# Patient Record
Sex: Male | Born: 1960
Health system: Southern US, Community
[De-identification: ages and names within clinical notes are randomized; demographics above are authoritative.]

## PROBLEM LIST (undated history)

## (undated) DIAGNOSIS — C9590 Leukemia, unspecified not having achieved remission: Secondary | ICD-10-CM

## (undated) DIAGNOSIS — C801 Malignant (primary) neoplasm, unspecified: Secondary | ICD-10-CM

## (undated) DIAGNOSIS — Q059 Spina bifida, unspecified: Secondary | ICD-10-CM

## (undated) HISTORY — DX: Malignant (primary) neoplasm, unspecified: C80.1

## (undated) HISTORY — PX: FRACTURE SURGERY: SHX138

## (undated) HISTORY — PX: INGUINAL HERNIA REPAIR: SUR1180

## (undated) HISTORY — DX: Spina bifida, unspecified: Q05.9

## (undated) HISTORY — DX: Leukemia, unspecified not having achieved remission: C95.90

---

## 2005-02-02 ENCOUNTER — Emergency Department (HOSPITAL_COMMUNITY): Admission: EM | Admit: 2005-02-02 | Discharge: 2005-02-02 | Payer: Self-pay | Admitting: Emergency Medicine

## 2013-04-18 ENCOUNTER — Telehealth: Payer: Self-pay | Admitting: Family Medicine

## 2013-05-12 ENCOUNTER — Ambulatory Visit (INDEPENDENT_AMBULATORY_CARE_PROVIDER_SITE_OTHER): Payer: Medicare Other | Admitting: Family Medicine

## 2013-05-12 ENCOUNTER — Encounter: Payer: Self-pay | Admitting: Family Medicine

## 2013-05-12 VITALS — BP 134/75 | HR 56 | Temp 97.1°F | Ht 70.0 in | Wt 170.0 lb

## 2013-05-12 DIAGNOSIS — Q539 Undescended testicle, unspecified: Secondary | ICD-10-CM

## 2013-05-12 DIAGNOSIS — M129 Arthropathy, unspecified: Secondary | ICD-10-CM

## 2013-05-12 DIAGNOSIS — Q5310 Unspecified undescended testicle, unilateral: Secondary | ICD-10-CM

## 2013-05-12 DIAGNOSIS — R6250 Unspecified lack of expected normal physiological development in childhood: Secondary | ICD-10-CM

## 2013-05-12 DIAGNOSIS — L989 Disorder of the skin and subcutaneous tissue, unspecified: Secondary | ICD-10-CM

## 2013-05-12 DIAGNOSIS — M1990 Unspecified osteoarthritis, unspecified site: Secondary | ICD-10-CM

## 2013-05-12 DIAGNOSIS — Q059 Spina bifida, unspecified: Secondary | ICD-10-CM

## 2013-05-12 DIAGNOSIS — Z0000 Encounter for general adult medical examination without abnormal findings: Secondary | ICD-10-CM

## 2013-05-12 DIAGNOSIS — L723 Sebaceous cyst: Secondary | ICD-10-CM

## 2013-05-12 LAB — POCT CBC
Granulocyte percent: 40.8 %G (ref 37–80)
HCT, POC: 46.5 % (ref 43.5–53.7)
Hemoglobin: 16.4 g/dL (ref 14.1–18.1)
Lymph, poc: 7.4 — AB (ref 0.6–3.4)
MCH, POC: 29.9 pg (ref 27–31.2)
MCHC: 35.2 g/dL (ref 31.8–35.4)
MCV: 84.9 fL (ref 80–97)
MPV: 9.4 fL (ref 0–99.8)
POC Granulocyte: 5.5 (ref 2–6.9)
POC LYMPH PERCENT: 55.4 %L — AB (ref 10–50)
Platelet Count, POC: 134 10*3/uL — AB (ref 142–424)
RBC: 5.5 M/uL (ref 4.69–6.13)
RDW, POC: 13.7 %
WBC: 13.4 10*3/uL — AB (ref 4.6–10.2)

## 2013-05-12 LAB — COMPLETE METABOLIC PANEL WITH GFR
ALT: 14 U/L (ref 0–53)
AST: 17 U/L (ref 0–37)
Albumin: 4.5 g/dL (ref 3.5–5.2)
Alkaline Phosphatase: 42 U/L (ref 39–117)
BUN: 20 mg/dL (ref 6–23)
CO2: 24 mEq/L (ref 19–32)
Calcium: 9.6 mg/dL (ref 8.4–10.5)
Chloride: 104 mEq/L (ref 96–112)
Creat: 0.9 mg/dL (ref 0.50–1.35)
GFR, Est African American: >89 mL/min
GFR, Est Non African American: >89 mL/min
Glucose, Bld: 100 mg/dL — ABNORMAL HIGH (ref 70–99)
Potassium: 4.8 mEq/L (ref 3.5–5.3)
Sodium: 140 mEq/L (ref 135–145)
Total Bilirubin: 0.8 mg/dL (ref 0.3–1.2)
Total Protein: 7.2 g/dL (ref 6.0–8.3)

## 2013-05-12 LAB — TSH: TSH: 4.44 u[IU]/mL (ref 0.350–4.500)

## 2013-05-12 LAB — LIPID PANEL
Cholesterol: 165 mg/dL (ref 0–200)
HDL: 46 mg/dL (ref >39)
LDL Cholesterol: 108 mg/dL — ABNORMAL HIGH (ref 0–99)
Total CHOL/HDL Ratio: 3.6 Ratio
Triglycerides: 54 mg/dL (ref <150)
VLDL: 11 mg/dL (ref 0–40)

## 2013-05-12 LAB — PSA: PSA: 2.45 ng/mL (ref <=4.00)

## 2013-05-12 NOTE — Patient Instructions (Signed)

## 2013-05-12 NOTE — Progress Notes (Signed)
  Subjective:    Patient ID: Lance Erickson, male    DOB: 22-Jun-1961, 52 y.o.   MRN: [id number]  HPI This 52 y.o. male presents for evaluation of establishment.  He has hx of closed spina bifida.  He is accompanied by his mother who gives hx.  He has hx of developmental delay.  He has some knots behind his right ear and umbilicus.  His mother states he has some moles on his back.  He gets occasional back pain if he over does it working in the yard and his back hurts and he takes naproxen and this helps.  He has not seen a PCP in over 20 years.   Hospitalizaiton - Pneumonia in childhood x2  PSH- ORIF left fx arm.  Undescended testes and surgery in childhood.   Family hx - mother hypertension, father - died 40 MI, kidney stones, back pain.  Brother - CAD, CABG, DM, OA, Cirrhosis, PVD  Immunizations - Flu shot last year, TD 2006.   Review of Systems    No chest pain, SOB, HA, dizziness, vision change, N/V, diarrhea, constipation, dysuria, urinary urgency or frequency, myalgias, arthralgias or rash.  Objective:   Physical Exam  Vital signs noted  Well developed well nourished male.  HEENT - Head atraumatic Large sebaceous cyst behind right ear.                Eyes - PERRLA, Conjuctiva - clear Sclera- Clear EOMI                Ears - EAC's Wnl TM's Wnl Gross Hearing WNL                Nose - Nares patent                 Throat - oropharanx wnl  Respiratory - Lungs CTA bilateral Cardiac - RRR S1 and S2 without murmur GI - Abdomen soft Nontender and bowel sounds active x 4.  Small umbilical hernia. Extremities - No edema. Neuro - Grossly intact. Skin - irregular Nevi left upper back.     Assessment & Plan:  Routine general medical examination at a health care facility - Plan: POCT CBC, Lipid panel, TSH, COMPLETE METABOLIC PANEL WITH GFR, PSA.  He is talked to about getting colonoscopy and will discuss at next visit.  Spina bifida - Closed and stable.  Undescended right testes -  Plan: Lipid panel  Sebaceous cyst - Plan: Ambulatory referral to Dermatology  Skin lesion of back - Plan: Ambulatory referral to Dermatology  Developmental delay Stable

## 2013-05-16 ENCOUNTER — Other Ambulatory Visit: Payer: Self-pay | Admitting: Family Medicine

## 2013-05-16 MED ORDER — AMOXICILLIN 875 MG PO TABS: 875.0000 mg | ORAL_TABLET | Freq: Two times a day (BID) | ORAL | Status: DC

## 2013-12-13 ENCOUNTER — Other Ambulatory Visit: Payer: Self-pay | Admitting: Family Medicine

## 2013-12-13 DIAGNOSIS — L723 Sebaceous cyst: Secondary | ICD-10-CM

## 2014-12-07 DIAGNOSIS — Z08 Encounter for follow-up examination after completed treatment for malignant neoplasm: Secondary | ICD-10-CM | POA: Diagnosis not present

## 2014-12-07 DIAGNOSIS — Z85828 Personal history of other malignant neoplasm of skin: Secondary | ICD-10-CM | POA: Diagnosis not present

## 2015-05-02 ENCOUNTER — Ambulatory Visit (INDEPENDENT_AMBULATORY_CARE_PROVIDER_SITE_OTHER): Payer: Medicare Other | Admitting: Family Medicine

## 2015-05-02 ENCOUNTER — Encounter: Payer: Self-pay | Admitting: Family Medicine

## 2015-05-02 ENCOUNTER — Encounter (INDEPENDENT_AMBULATORY_CARE_PROVIDER_SITE_OTHER): Payer: Self-pay

## 2015-05-02 VITALS — BP 122/76 | HR 67 | Temp 97.6°F | Ht 70.0 in | Wt 166.0 lb

## 2015-05-02 DIAGNOSIS — Z0000 Encounter for general adult medical examination without abnormal findings: Secondary | ICD-10-CM | POA: Insufficient documentation

## 2015-05-02 DIAGNOSIS — Q059 Spina bifida, unspecified: Secondary | ICD-10-CM | POA: Insufficient documentation

## 2015-05-02 DIAGNOSIS — R739 Hyperglycemia, unspecified: Secondary | ICD-10-CM

## 2015-05-02 LAB — GLUCOSE, POCT (MANUAL RESULT ENTRY): POC Glucose: 95 mg/dl (ref 70–99)

## 2015-05-02 NOTE — Progress Notes (Signed)
   Subjective:    Patient ID: Lance Erickson, male    DOB: 1960/11/28, 54 y.o.   MRN: [id number]  HPI 54 year old gentleman here for physical. He has a history of closed spina bifida. He lives with his mother and tends his roses and his dogs. Generally, he is very healthy. There is a question of some mental delays. Reviewing his labs from last exam he has borderline sugar, perhaps prediabetes. PSA, though not high was a little higher than anticipated. There are No new symptoms or complaints today  There are no active problems to display for this patient.  Outpatient Encounter Prescriptions as of 05/02/2015  Medication Sig  . [DISCONTINUED] amoxicillin (AMOXIL) 875 MG tablet Take 1 tablet (875 mg total) by mouth 2 (two) times daily.  . [DISCONTINUED] naproxen sodium (ANAPROX) 220 MG tablet Take 220 mg by mouth 2 (two) times daily with a meal.   No facility-administered encounter medications on file as of 05/02/2015.      Review of Systems  Constitutional: Negative.   HENT: Negative.   Eyes: Negative.   Respiratory: Negative.  Negative for shortness of breath.   Cardiovascular: Negative.  Negative for chest pain and leg swelling.  Gastrointestinal: Negative.   Genitourinary: Negative.   Musculoskeletal: Negative.   Skin: Negative.   Neurological: Negative.   Psychiatric/Behavioral: Negative.   All other systems reviewed and are negative.      Objective:   Physical Exam  Constitutional: He is oriented to person, place, and time. He appears well-developed and well-nourished.  Cardiovascular: Normal rate, regular rhythm and normal heart sounds.   Pulmonary/Chest: Effort normal and breath sounds normal.  Abdominal: Soft. Bowel sounds are normal.  Umbilical hernia present but easily reducible  Genitourinary: Prostate normal.  External hemorrhoid present  Musculoskeletal: Normal range of motion.  Neurological: He is alert and oriented to person, place, and time. He has normal  reflexes.  Psychiatric: He has a normal mood and affect. His behavior is normal.     BP 122/76 mmHg  Pulse 67  Temp(Src) 97.6 F (36.4 C) (Oral)  Ht 5\' 10"  (1.778 m)  Wt 166 lb (75.297 kg)  BMI 23.82 kg/m2       Assessment & Plan:  1. Health care maintenance Physically, exam is normal. There is some slowness in mentation but he is able to answer questions appropriately and enjoys a lifestyle which keeps him occupied. Living with his mother, he has minimal responsibilities or worries. - PSA, total and free - POCT glucose (manual entry)  2. Spina bifida This problem is stable   Wardell Honour MD

## 2015-05-03 LAB — PSA, TOTAL AND FREE
PROSTATE SPECIFIC AG, SERUM: 2.9 ng/mL (ref 0.0–4.0)
PSA FREE: 0.5 ng/mL
PSA, Free Pct: 17.2 %

## 2015-07-09 DIAGNOSIS — D485 Neoplasm of uncertain behavior of skin: Secondary | ICD-10-CM | POA: Diagnosis not present

## 2015-07-09 DIAGNOSIS — L570 Actinic keratosis: Secondary | ICD-10-CM | POA: Diagnosis not present

## 2015-07-09 DIAGNOSIS — C44311 Basal cell carcinoma of skin of nose: Secondary | ICD-10-CM | POA: Diagnosis not present

## 2015-08-11 DIAGNOSIS — C801 Malignant (primary) neoplasm, unspecified: Secondary | ICD-10-CM

## 2015-08-11 HISTORY — DX: Malignant (primary) neoplasm, unspecified: C80.1

## 2015-08-15 DIAGNOSIS — C44311 Basal cell carcinoma of skin of nose: Secondary | ICD-10-CM | POA: Diagnosis not present

## 2015-08-23 ENCOUNTER — Ambulatory Visit (INDEPENDENT_AMBULATORY_CARE_PROVIDER_SITE_OTHER): Payer: Medicare Other

## 2015-08-23 DIAGNOSIS — Z23 Encounter for immunization: Secondary | ICD-10-CM | POA: Diagnosis not present

## 2015-10-17 ENCOUNTER — Other Ambulatory Visit: Payer: Self-pay | Admitting: *Deleted

## 2015-10-17 DIAGNOSIS — Z1211 Encounter for screening for malignant neoplasm of colon: Secondary | ICD-10-CM

## 2015-11-16 NOTE — Progress Notes (Signed)
I cannot see results on this test

## 2016-02-14 DIAGNOSIS — Z85828 Personal history of other malignant neoplasm of skin: Secondary | ICD-10-CM | POA: Diagnosis not present

## 2016-02-14 DIAGNOSIS — D225 Melanocytic nevi of trunk: Secondary | ICD-10-CM | POA: Diagnosis not present

## 2016-02-14 DIAGNOSIS — C44319 Basal cell carcinoma of skin of other parts of face: Secondary | ICD-10-CM | POA: Diagnosis not present

## 2016-06-05 DIAGNOSIS — C44319 Basal cell carcinoma of skin of other parts of face: Secondary | ICD-10-CM | POA: Diagnosis not present

## 2016-07-22 ENCOUNTER — Telehealth: Payer: Self-pay | Admitting: Family Medicine

## 2016-08-27 ENCOUNTER — Ambulatory Visit (INDEPENDENT_AMBULATORY_CARE_PROVIDER_SITE_OTHER): Payer: Medicare Other | Admitting: Family Medicine

## 2016-08-27 ENCOUNTER — Encounter: Payer: Self-pay | Admitting: Family Medicine

## 2016-08-27 VITALS — BP 113/70 | HR 61 | Temp 97.0°F | Ht 70.0 in | Wt 154.0 lb

## 2016-08-27 DIAGNOSIS — Z136 Encounter for screening for cardiovascular disorders: Secondary | ICD-10-CM | POA: Diagnosis not present

## 2016-08-27 DIAGNOSIS — Z23 Encounter for immunization: Secondary | ICD-10-CM | POA: Diagnosis not present

## 2016-08-27 DIAGNOSIS — Z125 Encounter for screening for malignant neoplasm of prostate: Secondary | ICD-10-CM | POA: Diagnosis not present

## 2016-08-27 DIAGNOSIS — Z0000 Encounter for general adult medical examination without abnormal findings: Secondary | ICD-10-CM

## 2016-08-27 NOTE — Progress Notes (Signed)
   Subjective:    Patient ID: Lance Erickson, male    DOB: August 23, 1961, 55 y.o.   MRN: [id number]  HPI 55 year old gentleman who is on disability secondary to spina bifida. He lives with his mother. He has no complaints today. He gets occasional back pain or headache and takes Aleve which is effective.  Patient Active Problem List   Diagnosis Date Noted  . Spina bifida ([place]) 05/02/2015  . Annual physical exam 05/02/2015   No outpatient encounter prescriptions on file as of 08/27/2016.   No facility-administered encounter medications on file as of 08/27/2016.       Review of Systems  Constitutional: Positive for unexpected weight change.  Respiratory: Negative.   Cardiovascular: Negative.   Gastrointestinal: Positive for blood in stool.  Musculoskeletal: Negative.   Psychiatric/Behavioral: Negative.        Objective:   Physical Exam  Constitutional: He is oriented to person, place, and time. He appears well-developed and well-nourished.  HENT:  Head: Normocephalic.  Mouth/Throat: Oropharynx is clear and moist.  Eyes: Pupils are equal, round, and reactive to light.  Neck: Normal range of motion.  Cardiovascular: Normal rate, regular rhythm and normal heart sounds.   Pulmonary/Chest: Effort normal.  Abdominal: Soft. There is no tenderness.  Genitourinary: Prostate normal.  Musculoskeletal: Normal range of motion.  Neurological: He is alert and oriented to person, place, and time.  Psychiatric: He has a normal mood and affect. His behavior is normal.   BP 113/70   Pulse 61   Temp 97 F (36.1 C) (Oral)   Ht '5\' 10"'$  (1.778 m)   Wt 154 lb (69.9 kg)   BMI 22.10 kg/m         Assessment & Plan:  1. Health care maintenance Exam is normal. He has lost 12 pounds in a little over a year. Appetite is good. He claims to eat sweets now that's why he lost weight - CMP14+EGFR - Lipid panel - PSA, total and free  2. Encounter for immunization Flu vaccine given  Wardell Honour MD - Flu Vaccine QUAD 36+ mos IM

## 2016-08-28 LAB — LIPID PANEL
CHOLESTEROL TOTAL: 157 mg/dL (ref 100–199)
Chol/HDL Ratio: 3 ratio units (ref 0.0–5.0)
HDL: 52 mg/dL (ref >39)
LDL CALC: 92 mg/dL (ref 0–99)
TRIGLYCERIDES: 65 mg/dL (ref 0–149)
VLDL CHOLESTEROL CAL: 13 mg/dL (ref 5–40)

## 2016-08-28 LAB — CMP14+EGFR
ALBUMIN: 4.2 g/dL (ref 3.5–5.5)
ALK PHOS: 48 IU/L (ref 39–117)
ALT: 13 IU/L (ref 0–44)
AST: 15 IU/L (ref 0–40)
Albumin/Globulin Ratio: 1.7 (ref 1.2–2.2)
BUN / CREAT RATIO: 20 (ref 9–20)
BUN: 18 mg/dL (ref 6–24)
Bilirubin Total: 0.5 mg/dL (ref 0.0–1.2)
CHLORIDE: 99 mmol/L (ref 96–106)
CO2: 25 mmol/L (ref 18–29)
CREATININE: 0.89 mg/dL (ref 0.76–1.27)
Calcium: 9.3 mg/dL (ref 8.7–10.2)
GFR calc Af Amer: 112 mL/min/{1.73_m2} (ref >59)
GFR calc non Af Amer: 97 mL/min/{1.73_m2} (ref >59)
GLUCOSE: 95 mg/dL (ref 65–99)
Globulin, Total: 2.5 g/dL (ref 1.5–4.5)
Potassium: 4.3 mmol/L (ref 3.5–5.2)
Sodium: 140 mmol/L (ref 134–144)
Total Protein: 6.7 g/dL (ref 6.0–8.5)

## 2016-08-28 LAB — PSA, TOTAL AND FREE
PSA FREE: 0.51 ng/mL
PSA, Free Pct: 17.6 %
Prostate Specific Ag, Serum: 2.9 ng/mL (ref 0.0–4.0)

## 2017-03-23 ENCOUNTER — Ambulatory Visit (INDEPENDENT_AMBULATORY_CARE_PROVIDER_SITE_OTHER): Payer: Medicare Other | Admitting: *Deleted

## 2017-03-23 VITALS — BP 120/71 | HR 65 | Temp 97.7°F | Ht 69.0 in | Wt 159.0 lb

## 2017-03-23 DIAGNOSIS — Z0000 Encounter for general adult medical examination without abnormal findings: Secondary | ICD-10-CM | POA: Diagnosis not present

## 2017-03-23 NOTE — Patient Instructions (Signed)
  Mr. Ringler , Thank you for taking time to come for your Medicare Wellness Visit. I appreciate your ongoing commitment to your health goals. Please review the following plan we discussed and let me know if I can assist you in the future.   These are the goals we discussed: Goals    . Have 3 meals a day          healthy       This is a list of the screening recommended for you and due dates:  Health Maintenance  Topic Date Due  . Tetanus Vaccine  04/20/2017*  . Colon Cancer Screening  04/23/2017*  .  Hepatitis C: One time screening is recommended by Center for Disease Control  (CDC) for  adults born from [birth year] through 1965.   03/23/2022*  . HIV Screening  03/23/2024*  . Flu Shot  06/10/2017  *Topic was postponed. The date shown is not the original due date.   Keep CPE appt yearly - with Dr Livia Snellen We will recommend you get a CXR, EKG and the prevnar 13 at your next visit. Review the advanced directives.

## 2017-03-23 NOTE — Progress Notes (Signed)
Subjective:   Lance Erickson is a 56 y.o. male who presents for Medicare Annual/Subsequent preventive examination. He is accompanied today by his mother, whom he lives with. He has never worked, due to early diagnosis of Spina Bifida. He enjoys watching races and fishing. For exercise he does yard work. His diet is none to healthy, per mom. He enjoys pizza, burgers and hot dogs. He attends church regularly. He has 3 brothers that live locally. He and his mom care for 6 pets. Fall hazards were discussed today and he states that his health is about the same as a year ago.        Objective:    Vitals: BP 120/71 (BP Location: Right Arm)   Pulse 65   Temp 97.7 F (36.5 C) (Oral)   Ht 5\' 9"  (1.753 m)   Wt 159 lb (72.1 kg)   BMI 23.48 kg/m   Body mass index is 23.48 kg/m.  Tobacco History  Smoking Status  . Never Smoker  Smokeless Tobacco  . Never Used     Counseling given: Not Answered never used any tobacco products.  Past Medical History:  Diagnosis Date  . Cancer ([place]) 08/2015   skin, nose  . Spinal bifida, closed    Past Surgical History:  Procedure Laterality Date  . FRACTURE SURGERY     left arm   . INGUINAL HERNIA REPAIR Right    Family History  Problem Relation Age of Onset  . Hypertension Mother   . Deep vein thrombosis Mother   . Hyperlipidemia Mother   . Cancer Mother        skin  . Heart attack Father   . Diabetes Brother   . Hypertension Brother   . Hyperlipidemia Brother   . Heart attack Brother   . Arthritis Brother        back pain  . Arthritis Brother        back pain  . Cancer Maternal Grandmother        varian and uterus cancer  . Heart attack Maternal Grandmother   . Alcohol abuse Maternal Grandfather   . Tuberculosis Maternal Grandfather   . Heart disease Paternal Grandmother   . Pneumonia Paternal Grandfather    History  Sexual Activity  . Sexual activity: Not Currently    Outpatient Encounter Prescriptions as of 03/23/2017    Medication Sig  . naproxen (NAPROSYN) 250 MG tablet Take by mouth 2 (two) times daily with a meal.   No facility-administered encounter medications on file as of 03/23/2017.     Activities of Daily Living In your present state of health, do you have any difficulty performing the following activities: 03/23/2017  Hearing? N  Vision? Y  Difficulty concentrating or making decisions? N  Walking or climbing stairs? N  Dressing or bathing? N  Doing errands, shopping? N  Some recent data might be hidden  he wears reading glasses as needed.  Patient Care Team: [name], MD as PCP - General (Family Medicine) Druscilla Brownie, MD as Consulting Physician (Dermatology)   Assessment:    Exercise Activities and Dietary recommendations    Goals    . Have 3 meals a day          healthy      Fall Risk Fall Risk  03/23/2017  Falls in the past year? No   Depression Screen PHQ 2/9 Scores 03/23/2017 08/27/2016  PHQ - 2 Score 0 0    Cognitive Function MMSE - Mini  Mental State Exam 03/23/2017  Orientation to time 5  Orientation to Place 5  Registration 2  Attention/ Calculation 5  Recall 2  Language- name 2 objects 2  Language- repeat 1  Language- follow 3 step command 3  Language- read & follow direction 1  Write a sentence 1  Copy design 1  Total score 28    score today 28/30.    Immunization History  Administered Date(s) Administered  . Influenza,inj,Quad PF,36+ Mos 08/23/2015, 08/27/2016  . Influenza-Unspecified 08/21/2014   Screening Tests Health Maintenance  Topic Date Due  . TETANUS/TDAP  04/20/2017 (Originally 05/19/1981)  . COLONOSCOPY  04/23/2017 (Originally 05/19/2012)  . Hepatitis C Screening  03/23/2022 (Originally 1961-01-14)  . HIV Screening  03/23/2024 (Originally 05/19/1977)  . INFLUENZA VACCINE  06/10/2017      Plan:    We arranged a CPE with Dr Livia Snellen and he will keep that appt for Oct 2018. We will suggest that he get a PSA, Prevnar 13, CXR  and EKG at that OV He is also due a colonoscopy, but declines appt at this time.  He is also due a eye exam - he and his mom will schedule this.  I have personally reviewed and noted the following in the patient's chart:   . Medical and social history . Use of alcohol, tobacco or illicit drugs  . Current medications and supplements . Functional ability and status . Nutritional status . Physical activity . Advanced directives . List of other physicians . Hospitalizations, surgeries, and ER visits in previous 12 months . Vitals . Screenings to include cognitive, depression, and falls . Referrals and appointments  In addition, I have reviewed and discussed with patient certain preventive protocols, quality metrics, and best practice recommendations. A written personalized care plan for preventive services as well as general preventive health recommendations were provided to patient.     Ceclia Koker, Cameron Proud, LPN  6/57/9038 I have reviewed and agree with the above AWV documentation.  [name], M.D.

## 2017-08-26 ENCOUNTER — Ambulatory Visit (INDEPENDENT_AMBULATORY_CARE_PROVIDER_SITE_OTHER): Payer: Medicare Other

## 2017-08-26 DIAGNOSIS — Z23 Encounter for immunization: Secondary | ICD-10-CM | POA: Diagnosis not present

## 2017-08-31 ENCOUNTER — Ambulatory Visit: Payer: Medicare Other | Admitting: Family Medicine

## 2017-10-08 ENCOUNTER — Ambulatory Visit (INDEPENDENT_AMBULATORY_CARE_PROVIDER_SITE_OTHER): Payer: Medicare Other | Admitting: Family Medicine

## 2017-10-08 ENCOUNTER — Encounter: Payer: Self-pay | Admitting: Family Medicine

## 2017-10-08 VITALS — BP 125/65 | HR 60 | Temp 97.2°F | Ht 69.0 in | Wt 161.0 lb

## 2017-10-08 DIAGNOSIS — Q059 Spina bifida, unspecified: Secondary | ICD-10-CM

## 2017-10-08 DIAGNOSIS — Z0000 Encounter for general adult medical examination without abnormal findings: Secondary | ICD-10-CM

## 2017-10-08 DIAGNOSIS — Z125 Encounter for screening for malignant neoplasm of prostate: Secondary | ICD-10-CM | POA: Diagnosis not present

## 2017-10-08 NOTE — Progress Notes (Signed)
Subjective:  Patient ID: Lance Erickson, male    DOB: 1961-02-02  Age: 56 y.o. MRN: [id number]  CC: Annual Exam (pt here today for CPE, no concerns voiced.)   HPI Lance Erickson presents for CPE  Depression screen [facility] 2/9 10/08/2017 03/23/2017 08/27/2016  Decreased Interest 0 0 0  Down, Depressed, Hopeless 0 0 0  PHQ - 2 Score 0 0 0    History Lance Erickson has a past medical history of Cancer ([place]) (08/2015) and Spinal bifida, closed.   Lance Erickson has a past surgical history that includes Fracture surgery and Inguinal hernia repair (Right).   His family history includes Alcohol abuse in his maternal grandfather; Arthritis in his brother and brother; Cancer in his maternal grandmother and mother; Deep vein thrombosis in his mother; Diabetes in his brother; Heart attack in his brother, father, and maternal grandmother; Heart disease in his paternal grandmother; Hyperlipidemia in his brother and mother; Hypertension in his brother and mother; Pneumonia in his paternal grandfather; Tuberculosis in his maternal grandfather.Lance Erickson reports that  has never smoked. Lance Erickson has never used smokeless tobacco. Lance Erickson reports that Lance Erickson does not drink alcohol or use drugs.    ROS Review of Systems  Constitutional: Negative for activity change, appetite change, chills, diaphoresis, fatigue, fever and unexpected weight change.  HENT: Negative for congestion, ear pain, hearing loss, postnasal drip, rhinorrhea, sore throat, tinnitus and trouble swallowing.   Eyes: Negative for photophobia, pain, discharge and redness.  Respiratory: Negative for apnea, cough, choking, chest tightness, shortness of breath, wheezing and stridor.   Cardiovascular: Negative for chest pain, palpitations and leg swelling.  Gastrointestinal: Negative for abdominal distention, abdominal pain, blood in stool, constipation, diarrhea, nausea and vomiting.  Endocrine: Negative for cold intolerance, heat intolerance, polydipsia, polyphagia and polyuria.    Genitourinary: Negative for difficulty urinating, dysuria, enuresis, flank pain, frequency, genital sores, hematuria and urgency.  Musculoskeletal: Negative for arthralgias and joint swelling.  Skin: Negative for color change, rash and wound.  Allergic/Immunologic: Negative for immunocompromised state.  Neurological: Negative for dizziness, tremors, seizures, syncope, facial asymmetry, speech difficulty, weakness, light-headedness, numbness and headaches.  Hematological: Does not bruise/bleed easily.  Psychiatric/Behavioral: Negative for agitation, behavioral problems, confusion, decreased concentration, dysphoric mood, hallucinations, sleep disturbance and suicidal ideas. The patient is not nervous/anxious and is not hyperactive.     Objective:  BP 125/65   Pulse 60   Temp (!) 97.2 F (36.2 C) (Oral)   Ht 5\' 9"  (1.753 m)   Wt 161 lb (73 kg)   BMI 23.78 kg/m   BP Readings from Last 3 Encounters:  10/08/17 125/65  03/23/17 120/71  08/27/16 113/70    Wt Readings from Last 3 Encounters:  10/08/17 161 lb (73 kg)  03/23/17 159 lb (72.1 kg)  08/27/16 154 lb (69.9 kg)     Physical Exam  Constitutional: Lance Erickson is oriented to person, place, and time. Lance Erickson appears well-developed and well-nourished.  HENT:  Head: Normocephalic and atraumatic.  Mouth/Throat: Oropharynx is clear and moist.  Eyes: EOM are normal. Pupils are equal, round, and reactive to light.  Neck: Normal range of motion. No tracheal deviation present. No thyromegaly present.  Cardiovascular: Normal rate, regular rhythm and normal heart sounds. Exam reveals no gallop and no friction rub.  No murmur heard. Pulmonary/Chest: Breath sounds normal. Lance Erickson has no wheezes. Lance Erickson has no rales.  Abdominal: Soft. Lance Erickson exhibits no mass. There is no tenderness.  Musculoskeletal: Normal range of motion. Lance Erickson exhibits no edema.  Neurological: Lance Erickson is  alert and oriented to person, place, and time.  Skin: Skin is warm and dry.  Psychiatric: Lance Erickson has  a normal mood and affect.      Assessment & Plan:   Lance Erickson was seen today for annual exam.  Diagnoses and all orders for this visit:  Annual physical exam  Spina bifida, unspecified hydrocephalus presence, unspecified spinal region [facility])  CPE labs ordered.      I have discontinued Lance Erickson's naproxen.  Allergies as of 10/08/2017   No Known Allergies     Medication List    as of 10/08/2017  9:40 AM   You have not been prescribed any medications.      Follow-up: Return in about 1 year (around 10/08/2018).  [name], M.D.

## 2017-10-08 NOTE — Addendum Note (Signed)
Addended by: Marylin Crosby on: 10/08/2017 09:45 AM   Modules accepted: Orders

## 2017-10-09 LAB — CBC WITH DIFFERENTIAL/PLATELET
BASOS ABS: 0.1 10*3/uL (ref 0.0–0.2)
Basos: 0 %
EOS (ABSOLUTE): 0.1 10*3/uL (ref 0.0–0.4)
Eos: 1 %
Hematocrit: 38.5 % (ref 37.5–51.0)
Hemoglobin: 12.9 g/dL — ABNORMAL LOW (ref 13.0–17.7)
Immature Grans (Abs): 0 10*3/uL (ref 0.0–0.1)
Immature Granulocytes: 0 %
LYMPHS ABS: 11.2 10*3/uL — AB (ref 0.7–3.1)
Lymphs: 73 %
MCH: 28.5 pg (ref 26.6–33.0)
MCHC: 33.5 g/dL (ref 31.5–35.7)
MCV: 85 fL (ref 79–97)
MONOS ABS: 0.5 10*3/uL (ref 0.1–0.9)
Monocytes: 4 %
NEUTROS ABS: 3.4 10*3/uL (ref 1.4–7.0)
Neutrophils: 22 %
Platelets: 138 10*3/uL — ABNORMAL LOW (ref 150–379)
RBC: 4.52 x10E6/uL (ref 4.14–5.80)
RDW: 14.7 % (ref 12.3–15.4)
WBC: 15.3 10*3/uL — ABNORMAL HIGH (ref 3.4–10.8)

## 2017-10-09 LAB — LIPID PANEL
CHOL/HDL RATIO: 2.6 ratio (ref 0.0–5.0)
CHOLESTEROL TOTAL: 143 mg/dL (ref 100–199)
HDL: 54 mg/dL (ref >39)
LDL CALC: 83 mg/dL (ref 0–99)
Triglycerides: 31 mg/dL (ref 0–149)
VLDL CHOLESTEROL CAL: 6 mg/dL (ref 5–40)

## 2017-10-09 LAB — CMP14+EGFR
ALBUMIN: 4.4 g/dL (ref 3.5–5.5)
ALK PHOS: 52 IU/L (ref 39–117)
ALT: 10 IU/L (ref 0–44)
AST: 15 IU/L (ref 0–40)
Albumin/Globulin Ratio: 1.8 (ref 1.2–2.2)
BUN / CREAT RATIO: 19 (ref 9–20)
BUN: 20 mg/dL (ref 6–24)
Bilirubin Total: 0.4 mg/dL (ref 0.0–1.2)
CALCIUM: 9.4 mg/dL (ref 8.7–10.2)
CO2: 23 mmol/L (ref 20–29)
CREATININE: 1.07 mg/dL (ref 0.76–1.27)
Chloride: 103 mmol/L (ref 96–106)
GFR calc Af Amer: 90 mL/min/{1.73_m2} (ref >59)
GFR, EST NON AFRICAN AMERICAN: 78 mL/min/{1.73_m2} (ref >59)
GLOBULIN, TOTAL: 2.4 g/dL (ref 1.5–4.5)
GLUCOSE: 89 mg/dL (ref 65–99)
Potassium: 4.9 mmol/L (ref 3.5–5.2)
Sodium: 140 mmol/L (ref 134–144)
TOTAL PROTEIN: 6.8 g/dL (ref 6.0–8.5)

## 2017-10-09 LAB — VITAMIN D 25 HYDROXY (VIT D DEFICIENCY, FRACTURES): Vit D, 25-Hydroxy: 53.5 ng/mL (ref 30.0–100.0)

## 2017-10-09 LAB — PSA, TOTAL AND FREE
PSA FREE: 0.68 ng/mL
PSA, Free Pct: 18.9 %
Prostate Specific Ag, Serum: 3.6 ng/mL (ref 0.0–4.0)

## 2017-10-12 ENCOUNTER — Other Ambulatory Visit: Payer: Self-pay | Admitting: *Deleted

## 2017-10-12 DIAGNOSIS — D72820 Lymphocytosis (symptomatic): Secondary | ICD-10-CM

## 2017-10-14 ENCOUNTER — Encounter (HOSPITAL_COMMUNITY): Payer: Medicare Other | Attending: Oncology | Admitting: Oncology

## 2017-10-14 ENCOUNTER — Other Ambulatory Visit: Payer: Self-pay

## 2017-10-14 ENCOUNTER — Encounter (HOSPITAL_COMMUNITY): Payer: Self-pay | Admitting: Oncology

## 2017-10-14 ENCOUNTER — Other Ambulatory Visit (HOSPITAL_COMMUNITY)
Admission: RE | Admit: 2017-10-14 | Discharge: 2017-10-14 | Disposition: A | Discharge status: Home / Self Care | Payer: Medicare Other | Source: Ambulatory Visit | Attending: Oncology | Admitting: Oncology

## 2017-10-14 ENCOUNTER — Encounter (HOSPITAL_COMMUNITY): Payer: Medicare Other

## 2017-10-14 VITALS — BP 128/70 | HR 70 | Temp 98.0°F | Resp 18 | Ht 69.0 in | Wt 160.1 lb

## 2017-10-14 DIAGNOSIS — D72820 Lymphocytosis (symptomatic): Secondary | ICD-10-CM

## 2017-10-14 LAB — COMPREHENSIVE METABOLIC PANEL
ALBUMIN: 4.4 g/dL (ref 3.5–5.0)
ALT: 15 U/L — ABNORMAL LOW (ref 17–63)
ANION GAP: 8 (ref 5–15)
AST: 20 U/L (ref 15–41)
Alkaline Phosphatase: 49 U/L (ref 38–126)
BILIRUBIN TOTAL: 0.8 mg/dL (ref 0.3–1.2)
BUN: 20 mg/dL (ref 6–20)
CO2: 26 mmol/L (ref 22–32)
Calcium: 9.3 mg/dL (ref 8.9–10.3)
Chloride: 104 mmol/L (ref 101–111)
Creatinine, Ser: 0.89 mg/dL (ref 0.61–1.24)
GFR calc Af Amer: >60 mL/min (ref >60)
GFR calc non Af Amer: >60 mL/min (ref >60)
GLUCOSE: 96 mg/dL (ref 65–99)
POTASSIUM: 4.2 mmol/L (ref 3.5–5.1)
SODIUM: 138 mmol/L (ref 135–145)
TOTAL PROTEIN: 7.3 g/dL (ref 6.5–8.1)

## 2017-10-14 LAB — CBC WITH DIFFERENTIAL/PLATELET
BASOS ABS: 0 10*3/uL (ref 0.0–0.1)
Basophils Relative: 0 %
EOS PCT: 1 %
Eosinophils Absolute: 0.2 10*3/uL (ref 0.0–0.7)
HCT: 41.9 % (ref 39.0–52.0)
HEMOGLOBIN: 13.4 g/dL (ref 13.0–17.0)
LYMPHS PCT: 72 %
Lymphs Abs: 15.5 10*3/uL — ABNORMAL HIGH (ref 0.7–4.0)
MCH: 28.3 pg (ref 26.0–34.0)
MCHC: 32 g/dL (ref 30.0–36.0)
MCV: 88.4 fL (ref 78.0–100.0)
MONOS PCT: 3 %
Monocytes Absolute: 0.6 10*3/uL (ref 0.1–1.0)
Neutro Abs: 5.1 10*3/uL (ref 1.7–7.7)
Neutrophils Relative %: 24 %
Platelets: 129 10*3/uL — ABNORMAL LOW (ref 150–400)
RBC: 4.74 MIL/uL (ref 4.22–5.81)
RDW: 14.2 % (ref 11.5–15.5)
WBC: 21.4 10*3/uL — AB (ref 4.0–10.5)

## 2017-10-14 NOTE — Progress Notes (Signed)
[facility] Initial Visit:  Patient Care Team: [name], MD as PCP - General (Family Medicine) Druscilla Brownie, MD as Consulting Physician (Dermatology)  CHIEF COMPLAINTS/PURPOSE OF CONSULTATION:  Lymphocytosis  HISTORY OF PRESENTING ILLNESS: Lance Erickson 56 y.o. male presents today for evaluation of leukocytosis with his mother.  Patient had a CBC performed on 10/08/2017 which demonstrated WBC 15.3 K, hemoglobin 12.9 g/dL, hematocrit 38.5%, platelet count 138K, differential demonstrated elevated absolute lymphocyte count of 11.2 K.  Previous CBC from 4 years ago demonstrated WBC 13.4 K, absolute lymphocyte count of 7.4K.  Patient states that he has been doing well.  He has no complaints today.  He denies any headache, chest pain, shortness of breath, abdominal pain, nausea, vomiting, diarrhea, lymphadenopathy.  He denies any unexplained weight loss, fevers or chills, night sweats.  Review of Systems - Oncology ROS as per HPI otherwise 12 point ROS is negative.  MEDICAL HISTORY: Past Medical History:  Diagnosis Date  . Cancer ([place]) 08/2015   skin, nose  . Spinal bifida, closed     SURGICAL HISTORY: Past Surgical History:  Procedure Laterality Date  . FRACTURE SURGERY     left arm   . INGUINAL HERNIA REPAIR Right     SOCIAL HISTORY: Social History   Socioeconomic History  . Marital status: Single    Spouse name: Not on file  . Number of children: Not on file  . Years of education: Not on file  . Highest education level: Not on file  Social Needs  . Financial resource strain: Not on file  . Food insecurity - worry: Not on file  . Food insecurity - inability: Not on file  . Transportation needs - medical: Not on file  . Transportation needs - non-medical: Not on file  Occupational History  . Not on file  Tobacco Use  . Smoking status: Never Smoker  . Smokeless tobacco: Never Used  Substance and Sexual Activity  . Alcohol use: No  . Drug  use: No  . Sexual activity: Not Currently  Other Topics Concern  . Not on file  Social History Narrative  . Not on file    FAMILY HISTORY Family History  Problem Relation Age of Onset  . Hypertension Mother   . Deep vein thrombosis Mother   . Hyperlipidemia Mother   . Cancer Mother        skin  . Heart attack Father   . Diabetes Brother   . Hypertension Brother   . Hyperlipidemia Brother   . Heart attack Brother   . Arthritis Brother        back pain  . Arthritis Brother        back pain  . Cancer Maternal Grandmother        varian and uterus cancer  . Heart attack Maternal Grandmother   . Alcohol abuse Maternal Grandfather   . Tuberculosis Maternal Grandfather   . Heart disease Paternal Grandmother   . Pneumonia Paternal Grandfather     ALLERGIES:  has No Known Allergies.  MEDICATIONS:  No current outpatient medications on file.   No current facility-administered medications for this visit.     PHYSICAL EXAMINATION:  ECOG PERFORMANCE STATUS: 0 - Asymptomatic   Vitals:   10/14/17 1310  BP: 128/70  Pulse: 70  Resp: 18  Temp: 98 F (36.7 C)  SpO2: 100%    Filed Weights   10/14/17 1310  Weight: 160 lb 1.6 oz (72.6 kg)  Physical Exam Constitutional: Well-developed, well-nourished, and in no distress.   HENT:  Head: Normocephalic and atraumatic.  Mouth/Throat: No oropharyngeal exudate. Mucosa moist. Eyes: Pupils are equal, round, and reactive to light. Conjunctivae are normal. No scleral icterus.  Neck: Normal range of motion. Neck supple. No JVD present.  Cardiovascular: Normal rate, regular rhythm and normal heart sounds.  Exam reveals no gallop and no friction rub.   No murmur heard. Pulmonary/Chest: Effort normal and breath sounds normal. No respiratory distress. No wheezes.No rales.  Abdominal: Soft. Bowel sounds are normal. No distension. There is no tenderness. There is no guarding.  Musculoskeletal: No edema or tenderness.   Lymphadenopathy:    No cervical or supraclavicular adenopathy.  Neurological: Alert and oriented to person, place, and time. No cranial nerve deficit.  Skin: Skin is warm and dry. No rash noted. No erythema. No pallor.  Psychiatric: Affect and judgment normal.     LABORATORY DATA: I have personally reviewed the data as listed:  Office Visit on 10/08/2017  Component Date Value Ref Range Status  . WBC 10/08/2017 15.3* 3.4 - 10.8 x10E3/uL Final  . RBC 10/08/2017 4.52  4.14 - 5.80 x10E6/uL Final  . Hemoglobin 10/08/2017 12.9* 13.0 - 17.7 g/dL Final  . Hematocrit 10/08/2017 38.5  37.5 - 51.0 % Final  . MCV 10/08/2017 85  79 - 97 fL Final  . MCH 10/08/2017 28.5  26.6 - 33.0 pg Final  . MCHC 10/08/2017 33.5  31.5 - 35.7 g/dL Final  . RDW 10/08/2017 14.7  12.3 - 15.4 % Final  . Platelets 10/08/2017 138* 150 - 379 x10E3/uL Final  . Neutrophils 10/08/2017 22  Not Estab. % Final  . Lymphs 10/08/2017 73  Not Estab. % Final   Atypical lymphocytes.  . Monocytes 10/08/2017 4  Not Estab. % Final  . Eos 10/08/2017 1  Not Estab. % Final  . Basos 10/08/2017 0  Not Estab. % Final  . Neutrophils Absolute 10/08/2017 3.4  1.4 - 7.0 x10E3/uL Final  . Lymphocytes Absolute 10/08/2017 11.2* 0.7 - 3.1 x10E3/uL Final  . Monocytes Absolute 10/08/2017 0.5  0.1 - 0.9 x10E3/uL Final  . EOS (ABSOLUTE) 10/08/2017 0.1  0.0 - 0.4 x10E3/uL Final  . Basophils Absolute 10/08/2017 0.1  0.0 - 0.2 x10E3/uL Final  . Immature Granulocytes 10/08/2017 0  Not Estab. % Final  . Immature Grans (Abs) 10/08/2017 0.0  0.0 - 0.1 x10E3/uL Final  . Hematology Comments: 10/08/2017 Note:   Final   Verified by microscopic examination.  . Glucose 10/08/2017 89  65 - 99 mg/dL Final  . BUN 10/08/2017 20  6 - 24 mg/dL Final  . Creatinine, Ser 10/08/2017 1.07  0.76 - 1.27 mg/dL Final  . GFR calc non Af Amer 10/08/2017 78  >59 mL/min/1.73 Final  . GFR calc Af Amer 10/08/2017 90  >59 mL/min/1.73 Final  . BUN/Creatinine Ratio 10/08/2017 19   9 - 20 Final  . Sodium 10/08/2017 140  134 - 144 mmol/L Final  . Potassium 10/08/2017 4.9  3.5 - 5.2 mmol/L Final  . Chloride 10/08/2017 103  96 - 106 mmol/L Final  . CO2 10/08/2017 23  20 - 29 mmol/L Final  . Calcium 10/08/2017 9.4  8.7 - 10.2 mg/dL Final  . Total Protein 10/08/2017 6.8  6.0 - 8.5 g/dL Final  . Albumin 10/08/2017 4.4  3.5 - 5.5 g/dL Final  . Globulin, Total 10/08/2017 2.4  1.5 - 4.5 g/dL Final  . Albumin/Globulin Ratio 10/08/2017 1.8  1.2 - 2.2  Final  . Bilirubin Total 10/08/2017 0.4  0.0 - 1.2 mg/dL Final  . Alkaline Phosphatase 10/08/2017 52  39 - 117 IU/L Final  . AST 10/08/2017 15  0 - 40 IU/L Final  . ALT 10/08/2017 10  0 - 44 IU/L Final  . Cholesterol, Total 10/08/2017 143  100 - 199 mg/dL Final  . Triglycerides 10/08/2017 31  0 - 149 mg/dL Final  . HDL 10/08/2017 54  >39 mg/dL Final  . VLDL Cholesterol Cal 10/08/2017 6  5 - 40 mg/dL Final  . LDL Calculated 10/08/2017 83  0 - 99 mg/dL Final  . Chol/HDL Ratio 10/08/2017 2.6  0.0 - 5.0 ratio Final   Comment:                                   T. Chol/HDL Ratio                                             Men  Women                               1/2 Avg.Risk  3.4    3.3                                   Avg.Risk  5.0    4.4                                2X Avg.Risk  9.6    7.1                                3X Avg.Risk 23.4   11.0   . Prostate Specific Ag, Serum 10/08/2017 3.6  0.0 - 4.0 ng/mL Final   Comment: Roche ECLIA methodology. According to the American Urological Association, Serum PSA should decrease and remain at undetectable levels after radical prostatectomy. The AUA defines biochemical recurrence as an initial PSA value 0.2 ng/mL or greater followed by a subsequent confirmatory PSA value 0.2 ng/mL or greater. Values obtained with different assay methods or kits cannot be used interchangeably. Results cannot be interpreted as absolute evidence of the presence or absence of malignant disease.   .  PSA, Free 10/08/2017 0.68  N/A ng/mL Final   Roche ECLIA methodology.  . PSA, Free Pct 10/08/2017 18.9  % Final   Comment: The table below lists the probability of prostate cancer for men with non-suspicious DRE results and total PSA between 4 and 10 ng/mL, by patient age Ricci Barker, [place], [id number]).                   % Free PSA       50-64 yr        65-75 yr                   0.00-10.00%        56%             55%  10.01-15.00%        24%             35%                  15.01-20.00%        17%             23%                  20.01-25.00%        10%             20%                       >25.00%         5%              9% Please note:  Catalona et al did not make specific               recommendations regarding the use of               percent free PSA for any other population               of men.   . Vit D, 25-Hydroxy 10/08/2017 53.5  30.0 - 100.0 ng/mL Final   Comment: Vitamin D deficiency has been defined by the [place] practice guideline as a level of serum 25-OH vitamin D less than 20 ng/mL (1,2). The Endocrine Society went on to further define vitamin D insufficiency as a level between 21 and 29 ng/mL (2). 1. IOM (Institute of Medicine). 2010. Dietary reference    intakes for calcium and D. [place]: The    [employer]. 2. Holick MF, Binkley [place], Bischoff-Ferrari HA, et al.    Evaluation, treatment, and prevention of vitamin D    deficiency: an Endocrine Society clinical practice    guideline. JCEM. 2011 Jul; 96(7):1911-30.     RADIOGRAPHIC STUDIES: I have personally reviewed the radiological images as listed and agree with the findings in the report  No results found.  ASSESSMENT: Chronic lymphocytosis, suspect CLL  PLAN: Reviewed labwork with the patient and his mother. Discussed my suspicion that patient likely has CLL. Explained that usually CLL is an indolent blood cancer. Will  order a peripheral flow cytometry for confirmation of CLL. Repeat CBC, CMP. RTC in 4 weeks to review lab results and discuss next plan of care.  Explained that if this is CLL, he does not need to start treatment since he only has a lymphocytosis and does not have any of the indications to start treatment such as B symptoms, hemoglobin <10 g/dL, platelets <100k, bulky organomegaly.    Orders Placed This Encounter  Procedures  . CBC with Differential    Standing Status:   Future    Number of Occurrences:   1    Standing Expiration Date:   10/14/2018  . Comprehensive metabolic panel    Standing Status:   Future    Number of Occurrences:   1    Standing Expiration Date:   10/14/2018  . Miscellaneous [facility] test (send-out)    Standing Status:   Future    Standing Expiration Date:   10/14/2018    Order Specific Question:   Test name / description:    Answer:   peripheral blood flow cytometry    All questions were answered. The patient knows to call the clinic  with any problems, questions or concerns.  This note was electronically signed.    Twana First, MD  10/14/2017 1:35 PM

## 2017-10-14 NOTE — Patient Instructions (Signed)
[facility] at [facility] Discharge Instructions  RECOMMENDATIONS MADE BY THE CONSULTANT AND ANY TEST RESULTS WILL BE SENT TO YOUR REFERRING PHYSICIAN.    Thank you for choosing [facility] at [facility] to provide your oncology and hematology care.  To afford each patient quality time with our provider, please arrive at least 15 minutes before your scheduled appointment time.    If you have a lab appointment with the Cancer Center please come in thru the  Main Entrance and check in at the main information desk  You need to re-schedule your appointment should you arrive 10 or more minutes late.  We strive to give you quality time with our providers, and arriving late affects you and other patients whose appointments are after yours.  Also, if you no show three or more times for appointments you may be dismissed from the clinic at the providers discretion.     Again, thank you for choosing [facility].  Our hope is that these requests will decrease the amount of time that you wait before being seen by our physicians.       _____________________________________________________________  Should you have questions after your visit to [facility], please contact our office at [phone] between the hours of 8:30 a.m. and 4:30 p.m.  Voicemails left after 4:30 p.m. will not be returned until the following business day.  For prescription refill requests, have your pharmacy contact our office.       Resources For Cancer Patients and their Caregivers ? American Cancer Society: Can assist with transportation, wigs, general needs, runs Look Good Feel Better.        1-888-227-6333 ? Cancer Care: Provides financial assistance, online support groups, medication/co-pay assistance.  1-800-813-HOPE (4673) ? [facility] Assists [place] cancer patients and their families through emotional , educational  and financial support.  336-427-4357 ? [place] DSS Where to apply for food stamps, Medicaid and utility assistance. 336-342-1394 ? [facility]: Transportation to medical appointments. 336-347-2287 ? Social Security Administration: May apply for disability if have a Stage IV cancer. 336-342-7796 1-800-772-1213 ? [place] Aging, Disability and Transit Services: Assists with nutrition, care and transit needs. 336-349-2343  Cancer Center Support Programs: [email or web address]@ > Cancer Support Group  2nd Tuesday of the month 1pm-2pm, Journey Room  > Creative Journey  3rd Tuesday of the month 1130am-1pm, Journey Room  > Look Good Feel Better  1st Wednesday of the month 10am-12 noon, Journey Room (Call American Cancer Society to register 1-800-395-5775)   

## 2017-11-16 ENCOUNTER — Inpatient Hospital Stay (HOSPITAL_COMMUNITY): Payer: Medicare Other | Attending: Hematology and Oncology | Admitting: Hematology and Oncology

## 2017-11-16 ENCOUNTER — Other Ambulatory Visit (HOSPITAL_COMMUNITY): Payer: Medicare Other

## 2017-11-16 ENCOUNTER — Encounter (HOSPITAL_COMMUNITY): Payer: Self-pay | Admitting: Hematology and Oncology

## 2017-11-16 ENCOUNTER — Other Ambulatory Visit: Payer: Self-pay

## 2017-11-16 VITALS — BP 137/82 | HR 98 | Temp 98.0°F | Resp 16 | Ht 70.0 in | Wt 164.0 lb

## 2017-11-16 DIAGNOSIS — D72820 Lymphocytosis (symptomatic): Secondary | ICD-10-CM

## 2017-11-16 DIAGNOSIS — D479 Neoplasm of uncertain behavior of lymphoid, hematopoietic and related tissue, unspecified: Secondary | ICD-10-CM

## 2017-11-26 NOTE — Assessment & Plan Note (Signed)
57 y.o. male undergoing evaluation of peripheral blood lymphocytosis.  Initial assessment revealed presence of monoclonal B-cell process with differential diagnosis including CLL and mantle cell lymphoma.  Additional studies are indicated including imaging to accurately stage the process as well as a bone marrow biopsy for further tissue assessment and risk stratification.  Plan: --PET/CT --Bone marrow biopsy by interventional radiology with cytogenetics and molecular studies. --Return to clinic in 1 month to review findings and repeat lab work to assess for progression.

## 2017-11-26 NOTE — Progress Notes (Signed)
[name]:  Assessment: Lymphoproliferative disease [facility] 57 y.o. male undergoing evaluation of peripheral blood lymphocytosis.  Initial assessment revealed presence of monoclonal B-cell process with differential diagnosis including CLL and mantle cell lymphoma.  Additional studies are indicated including imaging to accurately stage the process as well as a bone marrow biopsy for further tissue assessment and risk stratification.  Plan: --PET/CT --Bone marrow biopsy by interventional radiology with cytogenetics and molecular studies. --Return to clinic in 1 month to review findings and repeat lab work to assess for progression.  Voice recognition software was used and creation of this note. Despite my best effort at editing the text, some misspelling/errors may have occurred.  Orders Placed This Encounter  Procedures  . NM PET Image Initial (PI) Skull Base To Thigh    Standing Status:   Future    Standing Expiration Date:   11/16/2018    Order Specific Question:   If indicated for the ordered procedure, I authorize the administration of a radiopharmaceutical per Radiology protocol    Answer:   Yes    Order Specific Question:   Preferred imaging location?    Answer:   [facility]    Order Specific Question:   Radiology Contrast Protocol - do NOT remove file path    Answer:   [email or web address]    Order Specific Question:   Reason for Exam additional comments    Answer:   Lymphoproliferative process, please eval for hypermetabolic LAD/splenomegaly  . CT Biopsy    Standing Status:   Future    Standing Expiration Date:   11/16/2018    Order Specific Question:   Lab orders requested (DO NOT place separate lab orders, these will be automatically ordered during procedure specimen collection):    Answer:   Cytology - Non Pap    Comments:   Cytogenetics, B-cell FISH incl t(11;14)    Order Specific Question:   Lab orders  requested (DO NOT place separate lab orders, these will be automatically ordered during procedure specimen collection):    Answer:   Surgical Pathology    Order Specific Question:   Lab orders requested (DO NOT place separate lab orders, these will be automatically ordered during procedure specimen collection):    Answer:   Other    Order Specific Question:   Reason for Exam (SYMPTOM  OR DIAGNOSIS REQUIRED)    Answer:   Lymphoproliferative process staging/evaluation    Order Specific Question:   Preferred imaging location?    Answer:   [facility]    Order Specific Question:   Radiology Contrast Protocol - do NOT remove file path    Answer:   [email or web address]  . CT BONE MARROW BIOPSY & ASPIRATION    Standing Status:   Future    Standing Expiration Date:   02/15/2019    Order Specific Question:   Reason for Exam (SYMPTOM  OR DIAGNOSIS REQUIRED)    Answer:   Lymphoproliferative process staging    Order Specific Question:   Preferred imaging location?    Answer:   [facility]    Order Specific Question:   Radiology Contrast Protocol - do NOT remove file path    Answer:   [email or web address]  . CBC with Differential ([place] Only)    Standing Status:   Future    Standing Expiration Date:   11/16/2018  . CMP ([place] only)    Standing Status:   Future    Standing Expiration Date:  11/16/2018  . Lactate dehydrogenase (LDH)    Standing Status:   Future    Standing Expiration Date:   11/16/2018  . Uric acid    Standing Status:   Future    Standing Expiration Date:   11/16/2018  . Beta 2 microglobulin    Standing Status:   Future    Standing Expiration Date:   11/16/2018    Cancer Staging No matching staging information was found for the patient.  All questions were answered.  . The patient knows to call the clinic with any problems, questions or concerns.  This note was electronically signed.     History of Presenting Illness Lance Erickson is a 57 y.o. male followed in the [place] for evaluation of leukocytosis. Patient was initially seen by Dr Talbert Cage on 10/14/17:  "Patient had a CBC performed on 10/08/2017 which demonstrated WBC 15.3 K, hemoglobin 12.9 g/dL, hematocrit 38.5%, platelet count 138K, differential demonstrated elevated absolute lymphocyte count of 11.2 K.  Previous CBC from 4 years ago demonstrated WBC 13.4 K, absolute lymphocyte count of 7.4K.  Patient states that he has been doing well.  He has no complaints today.  He denies any headache, chest pain, shortness of breath, abdominal pain, nausea, vomiting, diarrhea, lymphadenopathy.  He denies any unexplained weight loss, fevers or chills, night sweats."  Patient returns to the clinic to review the findings of the evaluation conducted so far.  In the interim, he denies any new complaints.  No unexpected weight loss, fevers, chills, night sweats.  No swollen lymph nodes in the neck, armpits, or groin.  Oncological/hematological History: --Flow Cytometry, 10/14/17: Positive for monoclonal B-cell population consistent with mantle cell lymphoma or CLL with out CD23 expression. IHC -- positive for CD5, CD19, CD20, CD 21, CK 22, HLA-DR, kappa light chain restriction and negative for CD23, CD10.  Medical History: Past Medical History:  Diagnosis Date  . Cancer ([place]) 08/2015   skin, nose  . Spinal bifida, closed     Surgical History: Past Surgical History:  Procedure Laterality Date  . FRACTURE SURGERY     left arm   . INGUINAL HERNIA REPAIR Right     Family History: Family History  Problem Relation Age of Onset  . Hypertension Mother   . Deep vein thrombosis Mother   . Hyperlipidemia Mother   . Cancer Mother        skin  . Heart attack Father   . Diabetes Brother   . Hypertension Brother   . Hyperlipidemia Brother   . Heart attack Brother   . Arthritis Brother        back pain  . Arthritis Brother         back pain  . Cancer Maternal Grandmother        varian and uterus cancer  . Heart attack Maternal Grandmother   . Alcohol abuse Maternal Grandfather   . Tuberculosis Maternal Grandfather   . Heart disease Paternal Grandmother   . Pneumonia Paternal Grandfather     Social History: Social History   Socioeconomic History  . Marital status: Single    Spouse name: Not on file  . Number of children: Not on file  . Years of education: Not on file  . Highest education level: Not on file  Social Needs  . Financial resource strain: Not on file  . Food insecurity - worry: Not on file  . Food insecurity - inability: Not on file  . Transportation needs - medical: Not  on file  . Transportation needs - non-medical: Not on file  Occupational History  . Not on file  Tobacco Use  . Smoking status: Never Smoker  . Smokeless tobacco: Never Used  Substance and Sexual Activity  . Alcohol use: No  . Drug use: No  . Sexual activity: Not Currently  Other Topics Concern  . Not on file  Social History Narrative  . Not on file    Allergies: No Known Allergies  Medications:  No current outpatient medications on file.   No current facility-administered medications for this visit.     Review of Systems: Review of Systems  All other systems reviewed and are negative.    PHYSICAL EXAMINATION Blood pressure 137/82, pulse 98, temperature 98 F (36.7 C), temperature source Oral, resp. rate 16, height _0  (1.778 m), weight 164 lb (74.4 kg), SpO2 99 %.  ECOG PERFORMANCE STATUS: 0 - Asymptomatic  Physical Exam  Constitutional: He is oriented to person, place, and time and well-developed, well-nourished, and in no distress. No distress.  HENT:  Head: Normocephalic and atraumatic.  Mouth/Throat: Oropharynx is clear and moist. No oropharyngeal exudate.  Eyes: Conjunctivae and EOM are normal. Pupils are equal, round, and reactive to light. No scleral icterus.  Neck: No thyromegaly  present.  Cardiovascular: Normal rate, regular rhythm, normal heart sounds and intact distal pulses.  No murmur heard. Pulmonary/Chest: Effort normal and breath sounds normal. No respiratory distress. He has no wheezes. He has no rales.  Abdominal: Soft. Bowel sounds are normal. He exhibits no distension. There is no tenderness. There is no rebound.  Musculoskeletal: He exhibits no edema.  Lymphadenopathy:    He has no cervical adenopathy.  Neurological: He is alert and oriented to person, place, and time. He has normal reflexes. No cranial nerve deficit.  Skin: Skin is warm and dry. No rash noted. He is not diaphoretic. No erythema.     LABORATORY DATA: I have personally reviewed the data as listed: No visits with results within 1 Week(s) from this visit.  Latest known visit with results is:  Lab on 10/14/2017  Component Date Value Ref Range Status  . WBC 10/14/2017 21.4* 4.0 - 10.5 K/uL Final  . RBC 10/14/2017 4.74  4.22 - 5.81 MIL/uL Final  . Hemoglobin 10/14/2017 13.4  13.0 - 17.0 g/dL Final  . HCT 10/14/2017 41.9  39.0 - 52.0 % Final  . MCV 10/14/2017 88.4  78.0 - 100.0 fL Final  . MCH 10/14/2017 28.3  26.0 - 34.0 pg Final  . MCHC 10/14/2017 32.0  30.0 - 36.0 g/dL Final  . RDW 10/14/2017 14.2  11.5 - 15.5 % Final  . Platelets 10/14/2017 129* 150 - 400 K/uL Final  . Neutrophils Relative % 10/14/2017 24  % Final  . Lymphocytes Relative 10/14/2017 72  % Final  . Monocytes Relative 10/14/2017 3  % Final  . Eosinophils Relative 10/14/2017 1  % Final  . Basophils Relative 10/14/2017 0  % Final  . Neutro Abs 10/14/2017 5.1  1.7 - 7.7 K/uL Final  . Lymphs Abs 10/14/2017 15.5* 0.7 - 4.0 K/uL Final  . Monocytes Absolute 10/14/2017 0.6  0.1 - 1.0 K/uL Final  . Eosinophils Absolute 10/14/2017 0.2  0.0 - 0.7 K/uL Final  . Basophils Absolute 10/14/2017 0.0  0.0 - 0.1 K/uL Final  . WBC Morphology 10/14/2017 WHITE COUNT CONFIRMED ON SMEAR   Final   Comment: ATYPICAL LYMPHOCYTES ABSOLUTE  LYMPHOCYTOSIS   . Sodium 10/14/2017 138  135 -  145 mmol/L Final  . Potassium 10/14/2017 4.2  3.5 - 5.1 mmol/L Final  . Chloride 10/14/2017 104  101 - 111 mmol/L Final  . CO2 10/14/2017 26  22 - 32 mmol/L Final  . Glucose, Bld 10/14/2017 96  65 - 99 mg/dL Final  . BUN 10/14/2017 20  6 - 20 mg/dL Final  . Creatinine, Ser 10/14/2017 0.89  0.61 - 1.24 mg/dL Final  . Calcium 10/14/2017 9.3  8.9 - 10.3 mg/dL Final  . Total Protein 10/14/2017 7.3  6.5 - 8.1 g/dL Final  . Albumin 10/14/2017 4.4  3.5 - 5.0 g/dL Final  . AST 10/14/2017 20  15 - 41 U/L Final  . ALT 10/14/2017 15* 17 - 63 U/L Final  . Alkaline Phosphatase 10/14/2017 49  38 - 126 U/L Final  . Total Bilirubin 10/14/2017 0.8  0.3 - 1.2 mg/dL Final  . GFR calc non Af Amer 10/14/2017 >60  >60 mL/min Final  . GFR calc Af Amer 10/14/2017 >60  >60 mL/min Final   Comment: (NOTE) The eGFR has been calculated using the CKD EPI equation. This calculation has not been validated in all clinical situations. eGFR's persistently <60 mL/min signify possible Chronic Kidney Disease.   Georgiann Hahn gap 10/14/2017 8  5 - 15 Final       Ardath Sax, MD

## 2017-11-27 ENCOUNTER — Ambulatory Visit (HOSPITAL_COMMUNITY): Payer: Medicare Other

## 2017-12-02 ENCOUNTER — Other Ambulatory Visit: Payer: Self-pay | Admitting: Radiology

## 2017-12-03 ENCOUNTER — Ambulatory Visit (HOSPITAL_COMMUNITY)
Admission: RE | Admit: 2017-12-03 | Discharge: 2017-12-03 | Disposition: A | Discharge status: Home / Self Care | Payer: Medicare Other | Source: Ambulatory Visit | Attending: Hematology and Oncology | Admitting: Hematology and Oncology

## 2017-12-03 ENCOUNTER — Encounter (HOSPITAL_COMMUNITY): Payer: Self-pay

## 2017-12-03 DIAGNOSIS — C8510 Unspecified B-cell lymphoma, unspecified site: Secondary | ICD-10-CM | POA: Insufficient documentation

## 2017-12-03 DIAGNOSIS — D479 Neoplasm of uncertain behavior of lymphoid, hematopoietic and related tissue, unspecified: Secondary | ICD-10-CM | POA: Diagnosis not present

## 2017-12-03 DIAGNOSIS — D72820 Lymphocytosis (symptomatic): Secondary | ICD-10-CM | POA: Insufficient documentation

## 2017-12-03 DIAGNOSIS — C8519 Unspecified B-cell lymphoma, extranodal and solid organ sites: Secondary | ICD-10-CM | POA: Diagnosis not present

## 2017-12-03 DIAGNOSIS — D696 Thrombocytopenia, unspecified: Secondary | ICD-10-CM | POA: Diagnosis not present

## 2017-12-03 LAB — BASIC METABOLIC PANEL
Anion gap: 9 (ref 5–15)
BUN: 20 mg/dL (ref 6–20)
CHLORIDE: 106 mmol/L (ref 101–111)
CO2: 26 mmol/L (ref 22–32)
Calcium: 9.4 mg/dL (ref 8.9–10.3)
Creatinine, Ser: 0.99 mg/dL (ref 0.61–1.24)
GFR calc Af Amer: >60 mL/min (ref >60)
GFR calc non Af Amer: >60 mL/min (ref >60)
GLUCOSE: 98 mg/dL (ref 65–99)
POTASSIUM: 4.1 mmol/L (ref 3.5–5.1)
SODIUM: 141 mmol/L (ref 135–145)

## 2017-12-03 LAB — CBC
HEMATOCRIT: 42.1 % (ref 39.0–52.0)
Hemoglobin: 14 g/dL (ref 13.0–17.0)
MCH: 28.6 pg (ref 26.0–34.0)
MCHC: 33.3 g/dL (ref 30.0–36.0)
MCV: 86.1 fL (ref 78.0–100.0)
Platelets: 121 10*3/uL — ABNORMAL LOW (ref 150–400)
RBC: 4.89 MIL/uL (ref 4.22–5.81)
RDW: 14.4 % (ref 11.5–15.5)
WBC: 20.3 10*3/uL — AB (ref 4.0–10.5)

## 2017-12-03 LAB — PROTIME-INR
INR: 1.17
Prothrombin Time: 14.8 seconds (ref 11.4–15.2)

## 2017-12-03 LAB — APTT: aPTT: 31 seconds (ref 24–36)

## 2017-12-03 MED ORDER — CEFAZOLIN SODIUM-DEXTROSE 2-4 GM/100ML-% IV SOLN: 2.0000 g | INTRAVENOUS | Status: DC

## 2017-12-03 MED ORDER — MIDAZOLAM HCL 2 MG/2ML IJ SOLN
INTRAMUSCULAR | Status: AC
  Filled 2017-12-03: qty 6

## 2017-12-03 MED ORDER — FENTANYL CITRATE (PF) 100 MCG/2ML IJ SOLN
INTRAMUSCULAR | Status: AC
  Filled 2017-12-03: qty 6

## 2017-12-03 MED ORDER — HYDROCODONE-ACETAMINOPHEN 5-325 MG PO TABS: 1.0000 | ORAL_TABLET | ORAL | Status: DC | PRN

## 2017-12-03 MED ORDER — FENTANYL CITRATE (PF) 100 MCG/2ML IJ SOLN
INTRAMUSCULAR | Status: AC | PRN
  Administered 2017-12-03: 50 ug via INTRAVENOUS
  Administered 2017-12-03 (×2): 25 ug via INTRAVENOUS

## 2017-12-03 MED ORDER — NALOXONE HCL 0.4 MG/ML IJ SOLN
INTRAMUSCULAR | Status: AC
  Filled 2017-12-03: qty 1

## 2017-12-03 MED ORDER — SODIUM CHLORIDE 0.9 % IV SOLN
INTRAVENOUS | Status: DC
  Administered 2017-12-03: 09:00:00 via INTRAVENOUS

## 2017-12-03 MED ORDER — FLUMAZENIL 0.5 MG/5ML IV SOLN
INTRAVENOUS | Status: AC
  Filled 2017-12-03: qty 5

## 2017-12-03 MED ORDER — MIDAZOLAM HCL 2 MG/2ML IJ SOLN
INTRAMUSCULAR | Status: AC | PRN
  Administered 2017-12-03 (×4): 1 mg via INTRAVENOUS

## 2017-12-03 NOTE — Procedures (Signed)
  Procedure: CT bone marrow biopsy R iliac EBL:   minimal Complications:  none immediate  See full dictation in [facility] PACS.  D. Ahmad Vanwey MD Main # [phone] Pager  [phone]    

## 2017-12-03 NOTE — Consult Note (Signed)
Chief Complaint: Patient was seen in consultation today for CT-guided bone marrow biopsy  Referring Physician(s): Perlov,Mikhail G  Supervising Physician: Arne Cleveland  Patient Status: [facility] - Out-pt  History of Present Illness: Lance Erickson is a 57 y.o. male with history of lymphocytosis on peripheral blood smear as well as findings c/w non-Hodgkin's lymphoma on flow cytometry who presents today for CT-guided bone marrow biopsy for further evaluation.  Past Medical History:  Diagnosis Date  . Cancer ([place]) 08/2015   skin, nose  . Spinal bifida, closed     Past Surgical History:  Procedure Laterality Date  . FRACTURE SURGERY     left arm   . INGUINAL HERNIA REPAIR Right     Allergies: Patient has no known allergies.  Medications: Prior to Admission medications   Not on File     Family History  Problem Relation Age of Onset  . Hypertension Mother   . Deep vein thrombosis Mother   . Hyperlipidemia Mother   . Cancer Mother        skin  . Heart attack Father   . Diabetes Brother   . Hypertension Brother   . Hyperlipidemia Brother   . Heart attack Brother   . Arthritis Brother        back pain  . Arthritis Brother        back pain  . Cancer Maternal Grandmother        varian and uterus cancer  . Heart attack Maternal Grandmother   . Alcohol abuse Maternal Grandfather   . Tuberculosis Maternal Grandfather   . Heart disease Paternal Grandmother   . Pneumonia Paternal Grandfather     Social History   Socioeconomic History  . Marital status: Single    Spouse name: None  . Number of children: None  . Years of education: None  . Highest education level: None  Social Needs  . Financial resource strain: None  . Food insecurity - worry: None  . Food insecurity - inability: None  . Transportation needs - medical: None  . Transportation needs - non-medical: None  Occupational History  . None  Tobacco Use  . Smoking status: Never Smoker  .  Smokeless tobacco: Never Used  Substance and Sexual Activity  . Alcohol use: No  . Drug use: No  . Sexual activity: Not Currently  Other Topics Concern  . None  Social History Narrative  . None     Review of Systems currently denies fever, headache, chest pain, dyspnea, cough, abdominal/back pain, nausea, vomiting or bleeding  Vital Signs: BP 137/79 (BP Location: Right Arm)   Pulse 71   Temp 98.7 F (37.1 C) (Oral)   Resp 18   SpO2 100%   Physical Exam awake, alert.  Chest clear to auscultation bilaterally.  Heart with regular rate and rhythm.  Abdomen soft, positive bowel sounds, nontender.  No lower extremity edema  Imaging: No results found.  Labs:  CBC: Recent Labs    10/08/17 0950 10/14/17 1329  WBC 15.3* 21.4*  HGB 12.9* 13.4  HCT 38.5 41.9  PLT 138* 129*    COAGS: No results for input(s): INR, APTT in the last 8760 hours.  BMP: Recent Labs    10/08/17 0950 10/14/17 1329  NA 140 138  K 4.9 4.2  CL 103 104  CO2 23 26  GLUCOSE 89 96  BUN 20 20  CALCIUM 9.4 9.3  CREATININE 1.07 0.89  GFRNONAA 78 >60  GFRAA 90 >60  LIVER FUNCTION TESTS: Recent Labs    10/08/17 0950 10/14/17 1329  BILITOT 0.4 0.8  AST 15 20  ALT 10 15*  ALKPHOS 52 49  PROT 6.8 7.3  ALBUMIN 4.4 4.4    TUMOR MARKERS: No results for input(s): AFPTM, CEA, CA199, CHROMGRNA in the last 8760 hours.  Assessment and Plan: 57 y.o. male with history of lymphocytosis on peripheral blood smear as well as findings c/w non-Hodgkin's lymphoma on flow cytometry who presents today for CT-guided bone marrow biopsy for further evaluation.Risks and benefits discussed with the patient including, but not limited to bleeding, infection, damage to adjacent structures or low yield requiring additional tests.All of the patient's questions were answered, patient is agreeable to proceed.Consent signed and in chart.      Thank you for this interesting consult.  I greatly enjoyed meeting Lance Erickson and look forward to participating in their care.  A copy of this report was sent to the requesting provider on this date.  Electronically Signed: D. Rowe Robert, PA-C 12/03/2017, 8:59 AM   I spent a total of 20 minutes    in face to face in clinical consultation, greater than 50% of which was counseling/coordinating care for CT guided bone marrow biopsy

## 2017-12-10 ENCOUNTER — Encounter (HOSPITAL_COMMUNITY)
Admission: RE | Admit: 2017-12-10 | Discharge: 2017-12-10 | Disposition: A | Discharge status: Home / Self Care | Payer: Medicare Other | Source: Ambulatory Visit | Attending: Hematology and Oncology | Admitting: Hematology and Oncology

## 2017-12-10 ENCOUNTER — Encounter (HOSPITAL_COMMUNITY): Payer: Self-pay

## 2017-12-10 DIAGNOSIS — C8590 Non-Hodgkin lymphoma, unspecified, unspecified site: Secondary | ICD-10-CM | POA: Diagnosis not present

## 2017-12-10 DIAGNOSIS — D479 Neoplasm of uncertain behavior of lymphoid, hematopoietic and related tissue, unspecified: Secondary | ICD-10-CM | POA: Diagnosis not present

## 2017-12-10 LAB — GLUCOSE, CAPILLARY: Glucose-Capillary: 92 mg/dL (ref 65–99)

## 2017-12-10 MED ORDER — FLUDEOXYGLUCOSE F - 18 (FDG) INJECTION
7.8700 | Freq: Once | INTRAVENOUS | Status: AC
  Administered 2017-12-10: 7.87 via INTRAVENOUS

## 2017-12-16 ENCOUNTER — Other Ambulatory Visit (HOSPITAL_COMMUNITY): Payer: Self-pay | Admitting: *Deleted

## 2017-12-16 DIAGNOSIS — D479 Neoplasm of uncertain behavior of lymphoid, hematopoietic and related tissue, unspecified: Secondary | ICD-10-CM

## 2017-12-17 ENCOUNTER — Encounter (HOSPITAL_COMMUNITY): Payer: Self-pay | Admitting: Internal Medicine

## 2017-12-17 ENCOUNTER — Inpatient Hospital Stay (HOSPITAL_COMMUNITY): Payer: Medicare Other

## 2017-12-17 ENCOUNTER — Ambulatory Visit (HOSPITAL_COMMUNITY): Payer: Medicare Other | Admitting: Internal Medicine

## 2017-12-17 ENCOUNTER — Inpatient Hospital Stay (HOSPITAL_COMMUNITY): Payer: Medicare Other | Attending: Adult Health | Admitting: Internal Medicine

## 2017-12-17 ENCOUNTER — Other Ambulatory Visit: Payer: Self-pay

## 2017-12-17 VITALS — BP 119/62 | HR 62 | Temp 98.0°F | Resp 16 | Ht 70.0 in | Wt 160.0 lb

## 2017-12-17 DIAGNOSIS — Q5310 Unspecified undescended testicle, unilateral: Secondary | ICD-10-CM | POA: Insufficient documentation

## 2017-12-17 DIAGNOSIS — Z79899 Other long term (current) drug therapy: Secondary | ICD-10-CM | POA: Diagnosis not present

## 2017-12-17 DIAGNOSIS — Q059 Spina bifida, unspecified: Secondary | ICD-10-CM | POA: Insufficient documentation

## 2017-12-17 DIAGNOSIS — D472 Monoclonal gammopathy: Secondary | ICD-10-CM | POA: Insufficient documentation

## 2017-12-17 DIAGNOSIS — D479 Neoplasm of uncertain behavior of lymphoid, hematopoietic and related tissue, unspecified: Secondary | ICD-10-CM | POA: Insufficient documentation

## 2017-12-17 DIAGNOSIS — R161 Splenomegaly, not elsewhere classified: Secondary | ICD-10-CM | POA: Insufficient documentation

## 2017-12-17 DIAGNOSIS — D696 Thrombocytopenia, unspecified: Secondary | ICD-10-CM | POA: Diagnosis not present

## 2017-12-17 DIAGNOSIS — D72820 Lymphocytosis (symptomatic): Secondary | ICD-10-CM | POA: Insufficient documentation

## 2017-12-17 LAB — COMPREHENSIVE METABOLIC PANEL
ALBUMIN: 4.2 g/dL (ref 3.5–5.0)
ALK PHOS: 46 U/L (ref 38–126)
ALT: 14 U/L — ABNORMAL LOW (ref 17–63)
AST: 21 U/L (ref 15–41)
Anion gap: 9 (ref 5–15)
BILIRUBIN TOTAL: 0.8 mg/dL (ref 0.3–1.2)
BUN: 25 mg/dL — ABNORMAL HIGH (ref 6–20)
CALCIUM: 9.5 mg/dL (ref 8.9–10.3)
CO2: 26 mmol/L (ref 22–32)
Chloride: 105 mmol/L (ref 101–111)
Creatinine, Ser: 1.05 mg/dL (ref 0.61–1.24)
GFR calc Af Amer: >60 mL/min (ref >60)
GLUCOSE: 93 mg/dL (ref 65–99)
POTASSIUM: 4.3 mmol/L (ref 3.5–5.1)
Sodium: 140 mmol/L (ref 135–145)
TOTAL PROTEIN: 7.2 g/dL (ref 6.5–8.1)

## 2017-12-17 LAB — CBC WITH DIFFERENTIAL/PLATELET
BASOS ABS: 0 10*3/uL (ref 0.0–0.1)
Basophils Relative: 0 %
EOS PCT: 1 %
Eosinophils Absolute: 0.2 10*3/uL (ref 0.0–0.7)
HEMATOCRIT: 41.3 % (ref 39.0–52.0)
HEMOGLOBIN: 13.1 g/dL (ref 13.0–17.0)
LYMPHS ABS: 14 10*3/uL — AB (ref 0.7–4.0)
LYMPHS PCT: 78 %
MCH: 27.9 pg (ref 26.0–34.0)
MCHC: 31.7 g/dL (ref 30.0–36.0)
MCV: 88.1 fL (ref 78.0–100.0)
MONOS PCT: 3 %
Monocytes Absolute: 0.5 10*3/uL (ref 0.1–1.0)
NEUTROS ABS: 3.2 10*3/uL (ref 1.7–7.7)
Neutrophils Relative %: 18 %
Platelets: 129 10*3/uL — ABNORMAL LOW (ref 150–400)
RBC: 4.69 MIL/uL (ref 4.22–5.81)
RDW: 14.5 % (ref 11.5–15.5)
WBC: 17.9 10*3/uL — ABNORMAL HIGH (ref 4.0–10.5)

## 2017-12-17 LAB — URIC ACID: Uric Acid, Serum: 5.9 mg/dL (ref 4.4–7.6)

## 2017-12-17 LAB — LACTATE DEHYDROGENASE: LDH: 112 U/L (ref 98–192)

## 2017-12-18 LAB — BETA 2 MICROGLOBULIN, SERUM: BETA 2 MICROGLOBULIN: 2.6 mg/L — AB (ref 0.6–2.4)

## 2017-12-28 NOTE — Progress Notes (Signed)
Chief complaint: Follow-up for peripheral blood lymphocytosis and thrombocytopenia.  HPI  This is a 57 year old gentleman who was last seen by Dr. Lebron Conners here on November 16, 2017 he ordered peripheral blood flow cytometry that suggested a monoclonal B-cell process.  Differential diagnosis included CLL versus mantle cell lymphoma.  He ordered a bone marrow biopsy and a PET scan to complete the staging workup.  Cytogenetics and molecular studies had been requested on the bone marrow as well. Patient returns today to review results.  He denies any new problems.  Specifically he has had no night sweats fevers no palpable lumps he has not lost any weight his appetite is normal.  Patient has significant history of spina bifidA and has an undescended right testicle.   His vital signs are stable today temperature is 98 F oxygen saturation is 100% on room air pulse is 62 regular blood pressure 119/62 respirations are 16.  Weight is stable at 160 pounds.  He denies any pain no other symptoms today.  Exam shows him to be alert awake oriented x3.  No respiratory distress.  No pallor, no icterus, no skin rashes. No petechiae. No palpable lymph nodes in the neck supraclavicular areas. Extremities are without edema.  Result review:  I reviewed the PET scan done on 12/10/2017 that shows mild hypermetabolism with adenopathy in both axillary and right inguinal areas and nonspecific mild splenomegaly without splenic hypermetabolism.  Small cervical and anterior mediastinal nodes.  Other incidental findings include the undescended right testicle which appeared to be in the inguinal area.  And prostatomegaly.  Ventral abdominal wall hernia containing fat was noted and bilateral kidney stones unobstructive.  Bone marrow flow cytometry showed clonal B-cell population slightly hypercellular bone marrow with involvement by non-Hodgkin's B-cell lymphoma a minor population of monoclonal plasma cells were also noted.   The bone marrow is prominently involved by B-cell lymphoproliferative process of small lymphoid cells expressing CD20 CD5 and positive cyclin D1 expression these findings suggested mantle cell lymphoma. A small second clone consisting of  2% of bone marrow shows  monoclonal kappa restricted plasma cells.  Bone marrow FISH panel for CLL and mantle cell lymphoma reported from cytogenetic lab at [facility] dated 12/03/2017 has been reviewed.  The report shows absence of   t( 11: 14) translocation  This does not support mantle cell lymphoma.  There were no other molecular cytogenetic abnormalities pertaining to CLL.  Beta-2 microglobulin is elevated at 2.6 today  CMP was normal, LDH is normal at 112 CBC continues to show stable elevated white blood cell count at 17.9 with absolute lymphocyte count of 14, stable and normal hemoglobin at 13.1 but stable but decreased platelet count 129.  Impression and plan:   Low-grade B-cell lymphoma/leukemia: Peripheral blood lymphocytosis and mild thrombocytopenia with radiologic evidence of mild splenomegaly and generalized lymphadenopathy PET scan shows mild hypermetabolism in these lymph nodes.  Bone marrow findings support B-cell neoplasm likely CLL.  Although cyclin D1 was present on immunohistochemistry, FISH for t 11-14 is negative this does not support mantle cell lymphoma.  The overall behavior of this neoplasm appears to be indolent, he will need to be observed without treatment at this time. I have informed the patient was accompanied by his mother today that he should contact us for any new symptoms including palpable lymph nodes fevers night sweats unexplained weight loss abdominal pain etc.  Monoclonal gammopathy of unknown significance: 2% clone of plasma cells was detected in the bone marrow suggestive of monoclonal  gammopathy of unknown significance.  I will order SPEP and IFE on the peripheral blood.  We will continue to monitor this as  well.  Incidentally noted ventral hernia and undescended testicle and enlarged prostate have been discussed with the patient he will reviewed these with his primary care.  He has no abdominal pain and has no dysuria or hematuria.  His last PSA was 3.6 in November 2018.  We will defer further management to his primary care.  He will return for follow-up in 3 months or sooner for any new symptoms.

## 2017-12-30 ENCOUNTER — Encounter (HOSPITAL_COMMUNITY): Payer: Self-pay

## 2018-01-01 LAB — CHROMOSOME ANALYSIS, BONE MARROW

## 2018-01-01 LAB — TISSUE HYBRIDIZATION (BONE MARROW)-NCBH

## 2018-03-18 ENCOUNTER — Ambulatory Visit (HOSPITAL_COMMUNITY): Payer: Medicare Other | Admitting: Internal Medicine

## 2018-03-18 ENCOUNTER — Inpatient Hospital Stay (HOSPITAL_COMMUNITY): Payer: Medicare Other | Attending: Adult Health

## 2018-03-18 DIAGNOSIS — Q5310 Unspecified undescended testicle, unilateral: Secondary | ICD-10-CM | POA: Insufficient documentation

## 2018-03-18 DIAGNOSIS — D479 Neoplasm of uncertain behavior of lymphoid, hematopoietic and related tissue, unspecified: Secondary | ICD-10-CM | POA: Diagnosis not present

## 2018-03-18 DIAGNOSIS — D696 Thrombocytopenia, unspecified: Secondary | ICD-10-CM | POA: Diagnosis not present

## 2018-03-18 DIAGNOSIS — Z79899 Other long term (current) drug therapy: Secondary | ICD-10-CM | POA: Diagnosis not present

## 2018-03-18 DIAGNOSIS — R161 Splenomegaly, not elsewhere classified: Secondary | ICD-10-CM | POA: Diagnosis not present

## 2018-03-18 DIAGNOSIS — D72820 Lymphocytosis (symptomatic): Secondary | ICD-10-CM | POA: Diagnosis not present

## 2018-03-18 DIAGNOSIS — Q059 Spina bifida, unspecified: Secondary | ICD-10-CM | POA: Diagnosis not present

## 2018-03-18 LAB — COMPREHENSIVE METABOLIC PANEL
ALK PHOS: 48 U/L (ref 38–126)
ALT: 13 U/L — AB (ref 17–63)
AST: 17 U/L (ref 15–41)
Albumin: 4.1 g/dL (ref 3.5–5.0)
Anion gap: 6 (ref 5–15)
BILIRUBIN TOTAL: 0.8 mg/dL (ref 0.3–1.2)
BUN: 19 mg/dL (ref 6–20)
CHLORIDE: 106 mmol/L (ref 101–111)
CO2: 27 mmol/L (ref 22–32)
Calcium: 9.3 mg/dL (ref 8.9–10.3)
Creatinine, Ser: 0.87 mg/dL (ref 0.61–1.24)
GLUCOSE: 101 mg/dL — AB (ref 65–99)
Potassium: 4 mmol/L (ref 3.5–5.1)
SODIUM: 139 mmol/L (ref 135–145)
Total Protein: 7 g/dL (ref 6.5–8.1)

## 2018-03-18 LAB — CBC WITH DIFFERENTIAL/PLATELET
Basophils Absolute: 0 10*3/uL (ref 0.0–0.1)
Basophils Relative: 0 %
EOS PCT: 1 %
Eosinophils Absolute: 0.2 10*3/uL (ref 0.0–0.7)
HEMATOCRIT: 40.1 % (ref 39.0–52.0)
HEMOGLOBIN: 12.9 g/dL — AB (ref 13.0–17.0)
LYMPHS ABS: 12.9 10*3/uL — AB (ref 0.7–4.0)
Lymphocytes Relative: 77 %
MCH: 28 pg (ref 26.0–34.0)
MCHC: 32.2 g/dL (ref 30.0–36.0)
MCV: 87 fL (ref 78.0–100.0)
MONOS PCT: 2 %
Monocytes Absolute: 0.3 10*3/uL (ref 0.1–1.0)
NEUTROS ABS: 3.3 10*3/uL (ref 1.7–7.7)
Neutrophils Relative %: 20 %
Platelets: 117 10*3/uL — ABNORMAL LOW (ref 150–400)
RBC: 4.61 MIL/uL (ref 4.22–5.81)
RDW: 14.3 % (ref 11.5–15.5)
WBC: 16.7 10*3/uL — ABNORMAL HIGH (ref 4.0–10.5)

## 2018-03-18 LAB — LACTATE DEHYDROGENASE: LDH: 108 U/L (ref 98–192)

## 2018-03-22 LAB — MULTIPLE MYELOMA PANEL, SERUM
ALBUMIN SERPL ELPH-MCNC: 3.7 g/dL (ref 2.9–4.4)
ALBUMIN/GLOB SERPL: 1.3 (ref 0.7–1.7)
Alpha 1: 0.2 g/dL (ref 0.0–0.4)
Alpha2 Glob SerPl Elph-Mcnc: 0.6 g/dL (ref 0.4–1.0)
B-GLOBULIN SERPL ELPH-MCNC: 1.8 g/dL — AB (ref 0.7–1.3)
GAMMA GLOB SERPL ELPH-MCNC: 0.4 g/dL (ref 0.4–1.8)
GLOBULIN, TOTAL: 3 g/dL (ref 2.2–3.9)
IGA: 34 mg/dL — AB (ref 90–386)
IgG (Immunoglobin G), Serum: 1136 mg/dL (ref 700–1600)
IgM (Immunoglobulin M), Srm: 18 mg/dL — ABNORMAL LOW (ref 20–172)
M Protein SerPl Elph-Mcnc: 1.1 g/dL — ABNORMAL HIGH
Total Protein ELP: 6.7 g/dL (ref 6.0–8.5)

## 2018-03-24 ENCOUNTER — Inpatient Hospital Stay (HOSPITAL_BASED_OUTPATIENT_CLINIC_OR_DEPARTMENT_OTHER): Payer: Medicare Other | Admitting: Internal Medicine

## 2018-03-24 ENCOUNTER — Other Ambulatory Visit: Payer: Self-pay

## 2018-03-24 ENCOUNTER — Encounter (HOSPITAL_COMMUNITY): Payer: Self-pay | Admitting: Internal Medicine

## 2018-03-24 VITALS — BP 124/63 | HR 60 | Temp 97.6°F | Resp 16 | Wt 161.8 lb

## 2018-03-24 DIAGNOSIS — Z79899 Other long term (current) drug therapy: Secondary | ICD-10-CM

## 2018-03-24 DIAGNOSIS — R161 Splenomegaly, not elsewhere classified: Secondary | ICD-10-CM | POA: Diagnosis not present

## 2018-03-24 DIAGNOSIS — Q059 Spina bifida, unspecified: Secondary | ICD-10-CM | POA: Diagnosis not present

## 2018-03-24 DIAGNOSIS — Q5310 Unspecified undescended testicle, unilateral: Secondary | ICD-10-CM | POA: Diagnosis not present

## 2018-03-24 DIAGNOSIS — D696 Thrombocytopenia, unspecified: Secondary | ICD-10-CM

## 2018-03-24 DIAGNOSIS — D479 Neoplasm of uncertain behavior of lymphoid, hematopoietic and related tissue, unspecified: Secondary | ICD-10-CM

## 2018-03-24 DIAGNOSIS — D72820 Lymphocytosis (symptomatic): Secondary | ICD-10-CM

## 2018-03-24 NOTE — Progress Notes (Signed)
Diagnosis Lymphoproliferative disease ([place]) - Plan: NM PET Image Restag (PS) Skull Base To Thigh, CBC with Differential/Platelet, Comprehensive metabolic panel, Lactate dehydrogenase, Sedimentation rate, Beta 2 microglobulin, serum  Staging Cancer Staging No matching staging information was found for the patient.  Assessment and Plan:  1.  B-cell neoplasm/CLLL versus MCL:  The pt denies any fevers, chills, night sweats, and has noted no adenopathy.  He denies any abdominal pain.  He is here to go over labs.  He is accompanied by his mother.    PET scan done on 12/10/2017 showed  IMPRESSION :   1. Low-level hypermetabolism corresponding to adenopathy within the bilateral axillas and right inguinal regions. Findings likely represent relatively low-grade lymphoma.Bilateral axillary adenopathy with low-level hypermetabolism. Example right axillary node which measures 3.2 x 1.5 cm and a S.U.V. max of 2.2 on image 54/series 4. Left axillary node of 2.8 x 1.7 cm and a S.U.V. max of 2.2 on image 55/series 4. Heart size accentuated by a pectus excavatum deformity. Prevascular nodes are not pathologic by size criteria. ABDOMEN/PELVIS: Hypermetabolic right inguinal node(s). More lateral node measures 1.1 cm and a S.U.V. max of 2.3 on image 183/series 4. A more medial focus of hypermetabolism measures 1.9 cm and a S.U.V. max of 2.7 on image 188/series 4. 2. Nonspecific splenomegaly, without significant splenic hypermetabolism. Cannot exclude splenic involvement.  Bone Marrow, Aspirate,Biopsy, and Clot, right iliac done on 12/03/2017 showed:  - SLIGHTLY HYPERCELLULAR BONE MARROW WITH INVOLVEMENT BY NON-HODGKIN'S B-CELL LYMPHOMA. - A MINOR POPULATION OF MONOCLONAL PLASMA CELLS. - SEE COMMENT. PERIPHERAL BLOOD: - LYMPHOCYTOSIS. - THROMBOCYTOPENIA. Diagnosis Note The bone marrow is prominently involved by a B-cell lymphoproliferative process of primarily small lymphoid cells expressing pan B-cell  antigens including CD20 associated with CD5 and cyclin D1 expression. The findings are most consistent with involvement by mantle cell lymphoma, but correlation with cytogenetic and FISH studies is recommended. There is also a minor population of monoclonal, kappa-restricted plasma cells representing 2% of cells in the aspirate associated with small clusters in the clot and biopsy sections that may not be necessarily part of the lymphoid neoplastic process and possibly represents a separate disease (plasma cell neoplasm/MGUS). Correlation with serum protein electrophoresis and immunofixation electrophoresis is recommended. (BNS:ecj:ah 12/04/26/201  Fish study done 11/2017 showed normal cytogenetics and was negative for T 11;14.    His MIPI score is 3.15 placing him in low risk category.    I have discussed with the patient and his mother  that PET scan done 11/2017 showed axillary and inguinal adenopathy that had a low SUV of 2.2 to 2.3 as well as splenomegaly.  Plt count is 117,000.  He is asymptomatic and Denies any B symptoms.  He has no palpable adenopathy on exam.    Pt will be set up for repeat PET maging in 05/2018 and will follow-up to go over results.  If any worsening of adenopathy, He will be set up for LN biopsy likely the axillary nodes for correlation with the bone marrow biopsy.    He is showing no findings of anemia and has mild thrombocytopenia with PLT count greater than 100,000.   While most patients with MCL who do not begin therapy will die of their disease within a few years, a small proportion, increasingly better defined, may remain stable for years. These occasional patients with low stage low-risk disease may have an indolent course, managed by observation, splenectomy, or treatment.    Pt's  clinical picture appears to currently indolent.  If Pet scan shows any change in adenopathy,  he will be set up for LN biopsy for definitive diagnosis and to correlate with bone marrow  findings.  All questions answered.  He is advised to notify the office if any change in symptoms prior to his next visit.    2.  Monoclonal gammopathy of unknown significance: 2% clone of plasma cells was detected in the bone marrow suggestive of monoclonal gammopathy of unknown significance.  SPEP done 03/18/2018 shows IgG monoclonal protein with kappa light chain  specificity.  M spike is 1.1 g/dl.  He will have repeat labs done in 3-4 months.  Pt shows no evidence of anemia, RI or hypercalcemia.    3. Enlarged prostate and undescended testicle.  Pt asymptomatic.   Last PSA was 3.6 in November 2018.  We will defer further management to his primary care or refer to urology.    4.  Spina Bifida.  Follow-up with PCP.    Interval history:  57 y.o. male initially seen by Dr. Talbert Cage for leukocytosis with his mother.  Patient had a CBC performed on 10/08/2017 which demonstrated WBC 15.3 K, hemoglobin 12.9 g/dL, hematocrit 38.5%, platelet count 138K, differential demonstrated elevated absolute lymphocyte count of 11.2 K.  Previous CBC from 4 years ago demonstrated WBC 13.4 K, absolute lymphocyte count of 7.4K.    He has subsequent evaluation by Dr Lebron Conners and pt was found to have monoclonal B-cell process with differential diagnosis including CLL and mantle cell lymphoma.  He was recommended for PET scan and bone marrow biopsy.    Pt was seen by Dr. Sherrine Maples PET scan done on 12/10/2017 showed  IMPRESSION :   1. Low-level hypermetabolism corresponding to adenopathy within the bilateral axillas and right inguinal regions. Findings likely represent relatively low-grade lymphoma.Bilateral axillary adenopathy with low-level hypermetabolism. Example right axillary node which measures 3.2 x 1.5 cm and a S.U.V. max of 2.2 on image 54/series 4. Left axillary node of 2.8 x 1.7 cm and a S.U.V. max of 2.2 on image 55/series 4. Heart size accentuated by a pectus excavatum deformity. Prevascular nodes are  not pathologic by size criteria. ABDOMEN/PELVIS: Hypermetabolic right inguinal node(s). More lateral node measures 1.1 cm and a S.U.V. max of 2.3 on image 183/series 4. A more medial focus of hypermetabolism measures 1.9 cm and a S.U.V. max of 2.7 on image 188/series 4. 2. Nonspecific splenomegaly, without significant splenic hypermetabolism. Cannot exclude splenic involvement. 3. Small cervical and anterior mediastinal nodes are nonspecific. 4. Medial right inguinal hypermetabolism could also represent an enlarged lymph node or inguinal position of the right testicle. Consider physical exam correlation. 5. Prostatomegaly. 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 Consider correlation with PSA level. 6. Ventral abdominal wall hernia containing fat. Possible edema within the fat. Consider physical exam correlation to exclude incarceration. 7. Bilateral nephrolithiasis.   Bone Marrow, Aspirate,Biopsy, and Clot, right iliac done on 12/03/2017 showed:  - SLIGHTLY HYPERCELLULAR BONE MARROW WITH INVOLVEMENT BY NON-HODGKIN'S B-CELL LYMPHOMA. - A MINOR POPULATION OF MONOCLONAL PLASMA CELLS. - SEE COMMENT. PERIPHERAL BLOOD: - LYMPHOCYTOSIS. - THROMBOCYTOPENIA. Diagnosis Note The bone marrow is prominently involved by a B-cell lymphoproliferative process of primarily small lymphoid cells expressing pan B-cell antigens including CD20 associated with CD5 and cyclin D1 expression. The findings are most consistent with involvement by mantle cell lymphoma, but correlation with cytogenetic and FISH studies is recommended. There is also a minor population of monoclonal, kappa-restricted plasma cells representing 2% of cells in the aspirate associated with small clusters in the clot  and biopsy sections that may not be necessarily part of the lymphoid neoplastic process and possibly represents a separate disease (plasma cell neoplasm/MGUS). Correlation with serum protein electrophoresis and immunofixation  electrophoresis is recommended. (BNS:ecj:ah 12/04/26/201  Bone marrow FISH panel for CLL and mantle cell lymphoma reported [facility] dated 12/03/2017 report shows absence of   t( 11: 14) translocation    Current Status:  Pt is seen today for follow-up.  He is here to go over labs.  Patient denies any fevers, chills, night sweats, adenopathy, abdominal pain.   Problem List Patient Active Problem List   Diagnosis Date Noted  . Lymphoproliferative disease ([place]) [D47.9] 11/16/2017  . Spina bifida ([place]) [Q05.9] 05/02/2015  . Annual physical exam [T73.22] 05/02/2015    Past Medical History Past Medical History:  Diagnosis Date  . Cancer ([place]) 08/2015   skin, nose  . Spinal bifida, closed     Past Surgical History Past Surgical History:  Procedure Laterality Date  . FRACTURE SURGERY     left arm   . INGUINAL HERNIA REPAIR Right     Family History Family History  Problem Relation Age of Onset  . Hypertension Mother   . Deep vein thrombosis Mother   . Hyperlipidemia Mother   . Cancer Mother        skin  . Heart attack Father   . Diabetes Brother   . Hypertension Brother   . Hyperlipidemia Brother   . Heart attack Brother   . Arthritis Brother        back pain  . Arthritis Brother        back pain  . Cancer Maternal Grandmother        varian and uterus cancer  . Heart attack Maternal Grandmother   . Alcohol abuse Maternal Grandfather   . Tuberculosis Maternal Grandfather   . Heart disease Paternal Grandmother   . Pneumonia Paternal Grandfather      Social History  reports that he has never smoked. He has never used smokeless tobacco. He reports that he does not drink alcohol or use drugs.  Medications No current outpatient medications on file.  Allergies Patient has no known allergies.  Review of Systems Review of Systems - Oncology ROS as per HPI otherwise 12 point ROS is negative.   Physical Exam  Vitals Wt Readings from Last 3  Encounters:  03/24/18 161 lb 12.8 oz (73.4 kg)  12/17/17 160 lb (72.6 kg)  11/16/17 164 lb (74.4 kg)   Temp Readings from Last 3 Encounters:  03/24/18 97.6 F (36.4 C) (Oral)  12/17/17 98 F (36.7 C) (Oral)  12/03/17 99 F (37.2 C) (Oral)   BP Readings from Last 3 Encounters:  03/24/18 124/63  12/17/17 119/62  12/03/17 137/69   Pulse Readings from Last 3 Encounters:  03/24/18 60  12/17/17 62  12/03/17 71   Constitutional: Well-developed, well-nourished, and in no distress.   HENT: Head: Normocephalic and atraumatic.  Mouth/Throat: No oropharyngeal exudate. Mucosa moist. Eyes: Pupils are equal, round, and reactive to light. Conjunctivae are normal. No scleral icterus.  Neck: Normal range of motion. Neck supple. No JVD present.  Cardiovascular: Normal rate, regular rhythm and normal heart sounds.  Exam reveals no gallop and no friction rub.   No murmur heard. Pulmonary/Chest: Effort normal and breath sounds normal. No respiratory distress. No wheezes.No rales.  Abdominal: Soft. Bowel sounds are normal. No distension. There is no tenderness. There is no guarding.  Musculoskeletal: No edema  or tenderness.  Lymphadenopathy: No cervical, axillary or supraclavicular adenopathy.  Neurological: Alert and oriented to person, place, and time. No cranial nerve deficit.  Skin: Skin is warm and dry. No rash noted. No erythema. No pallor.  Psychiatric: Affect and judgment normal.   Labs No visits with results within 3 Day(s) from this visit.  Latest known visit with results is:  Lab on 03/18/2018  Component Date Value Ref Range Status  . IgG (Immunoglobin G), Serum 03/18/2018 1,136  700 - 1,600 mg/dL Final  . IgA 03/18/2018 34* 90 - 386 mg/dL Final   Result confirmed on concentration.  . IgM (Immunoglobulin M), Srm 03/18/2018 18* 20 - 172 mg/dL Final   Result confirmed on concentration.  . Total Protein ELP 03/18/2018 6.7  6.0 - 8.5 g/dL Corrected  . Albumin SerPl Elph-Mcnc  03/18/2018 3.7  2.9 - 4.4 g/dL Corrected  . Alpha 1 03/18/2018 0.2  0.0 - 0.4 g/dL Corrected  . Alpha2 Glob SerPl Elph-Mcnc 03/18/2018 0.6  0.4 - 1.0 g/dL Corrected  . B-Globulin SerPl Elph-Mcnc 03/18/2018 1.8* 0.7 - 1.3 g/dL Corrected  . Gamma Glob SerPl Elph-Mcnc 03/18/2018 0.4  0.4 - 1.8 g/dL Corrected  . M Protein SerPl Elph-Mcnc 03/18/2018 1.1* Not Observed g/dL Corrected  . Globulin, Total 03/18/2018 3.0  2.2 - 3.9 g/dL Corrected  . Albumin/Glob SerPl 03/18/2018 1.3  0.7 - 1.7 Corrected  . IFE 1 03/18/2018 Comment   Corrected   Comment: (NOTE) Immunofixation shows IgG monoclonal protein with kappa light chain specificity. Please note that samples from patients receiving DARZALEX(R) (daratumumab) treatment can appear as an "IgG kappa" and mask a complete response. If this patient is receiving DARA, this IFE assay interference can be removed by ordering test number [id number]-"Immunofixation, Daratumumab-Specific, Serum" and submitting a new sample for testing or by calling the lab to add this test to the current sample.   . Please Note 03/18/2018 Comment   Corrected   Comment: (NOTE) Protein electrophoresis scan will follow via computer, mail, or courier delivery. Performed At: [facility] [place], [place] [id number] Rush Farmer MD NW:[id number] Performed at [facility] [address] [place], [place] [address]   . LDH 03/18/2018 108  98 - 192 U/L Final   Performed at [facility], [address] [place], [place] [address]  . Sodium 03/18/2018 139  135 - 145 mmol/L Final  . Potassium 03/18/2018 4.0  3.5 - 5.1 mmol/L Final  . Chloride 03/18/2018 106  101 - 111 mmol/L Final  . CO2 03/18/2018 27  22 - 32 mmol/L Final  . Glucose, Bld 03/18/2018 101* 65 - 99 mg/dL Final  . BUN 03/18/2018 19  6 - 20 mg/dL Final  . Creatinine, Ser 03/18/2018 0.87  0.61 - 1.24 mg/dL Final  . Calcium 03/18/2018 9.3  8.9 - 10.3 mg/dL Final  . Total Protein 03/18/2018 7.0   6.5 - 8.1 g/dL Final  . Albumin 03/18/2018 4.1  3.5 - 5.0 g/dL Final  . AST 03/18/2018 17  15 - 41 U/L Final  . ALT 03/18/2018 13* 17 - 63 U/L Final  . Alkaline Phosphatase 03/18/2018 48  38 - 126 U/L Final  . Total Bilirubin 03/18/2018 0.8  0.3 - 1.2 mg/dL Final  . GFR calc non Af Amer 03/18/2018 >60  >60 mL/min Final  . GFR calc Af Amer 03/18/2018 >60  >60 mL/min Final   Comment: (NOTE) The eGFR has been calculated using the CKD EPI equation. This calculation has not been  validated in all clinical situations. eGFR's persistently <60 mL/min signify possible Chronic Kidney Disease.   Georgiann Hahn gap 03/18/2018 6  5 - 15 Final   Performed at [facility], [address], [place], [place] [address]  . WBC 03/18/2018 16.7* 4.0 - 10.5 K/uL Final   WHITE COUNT CONFIRMED ON SMEAR  . RBC 03/18/2018 4.61  4.22 - 5.81 MIL/uL Final  . Hemoglobin 03/18/2018 12.9* 13.0 - 17.0 g/dL Final  . HCT 03/18/2018 40.1  39.0 - 52.0 % Final  . MCV 03/18/2018 87.0  78.0 - 100.0 fL Final  . MCH 03/18/2018 28.0  26.0 - 34.0 pg Final  . MCHC 03/18/2018 32.2  30.0 - 36.0 g/dL Final  . RDW 03/18/2018 14.3  11.5 - 15.5 % Final  . Platelets 03/18/2018 117* 150 - 400 K/uL Final   Comment: SPECIMEN CHECKED FOR CLOTS PLATELET COUNT CONFIRMED BY SMEAR   . Neutrophils Relative % 03/18/2018 20  % Final  . Lymphocytes Relative 03/18/2018 77  % Final  . Monocytes Relative 03/18/2018 2  % Final  . Eosinophils Relative 03/18/2018 1  % Final  . Basophils Relative 03/18/2018 0  % Final  . Neutro Abs 03/18/2018 3.3  1.7 - 7.7 K/uL Final  . Lymphs Abs 03/18/2018 12.9* 0.7 - 4.0 K/uL Final  . Monocytes Absolute 03/18/2018 0.3  0.1 - 1.0 K/uL Final  . Eosinophils Absolute 03/18/2018 0.2  0.0 - 0.7 K/uL Final  . Basophils Absolute 03/18/2018 0.0  0.0 - 0.1 K/uL Final  . WBC Morphology 03/18/2018 ATYPICAL LYMPHOCYTES   Final   Comment: ABSOLUTE LYMPHOCYTOSIS Performed at [facility], [address] [place], [place] [address]       Pathology Orders Placed This Encounter  Procedures  . NM PET Image Restag (PS) Skull Base To Thigh    Standing Status:   Future    Standing Expiration Date:   03/24/2019    Order Specific Question:   If indicated for the ordered procedure, I authorize the administration of a radiopharmaceutical per Radiology protocol    Answer:   Yes    Order Specific Question:   Preferred imaging location?    Answer:   [facility]    Order Specific Question:   Radiology Contrast Protocol - do NOT remove file path    Answer:   [email or web address]  . CBC with Differential/Platelet    Standing Status:   Future    Standing Expiration Date:   03/25/2019  . Comprehensive metabolic panel    Standing Status:   Future    Standing Expiration Date:   03/25/2019  . Lactate dehydrogenase    Standing Status:   Future    Standing Expiration Date:   03/25/2019  . Sedimentation rate    Standing Status:   Future    Standing Expiration Date:   03/25/2019  . Beta 2 microglobulin, serum    Standing Status:   Future    Standing Expiration Date:   03/25/2019       Zoila Shutter MD

## 2018-03-24 NOTE — Patient Instructions (Signed)
[facility] at [facility] Discharge Instructions  Seen by Dr. Higgs today Follow up as scheduled.  Thank you for choosing [facility] at [facility] to provide your oncology and hematology care.  To afford each patient quality time with our provider, please arrive at least 15 minutes before your scheduled appointment time.   If you have a lab appointment with the Cancer Center please come in thru the  Main Entrance and check in at the main information desk  You need to re-schedule your appointment should you arrive 10 or more minutes late.  We strive to give you quality time with our providers, and arriving late affects you and other patients whose appointments are after yours.  Also, if you no show three or more times for appointments you may be dismissed from the clinic at the providers discretion.     Again, thank you for choosing [facility].  Our hope is that these requests will decrease the amount of time that you wait before being seen by our physicians.       _____________________________________________________________  Should you have questions after your visit to [facility], please contact our office at [phone] between the hours of 8:30 a.m. and 4:30 p.m.  Voicemails left after 4:30 p.m. will not be returned until the following business day.  For prescription refill requests, have your pharmacy contact our office.       Resources For Cancer Patients and their Caregivers ? American Cancer Society: Can assist with transportation, wigs, general needs, runs Look Good Feel Better.        1-888-227-6333 ? Cancer Care: Provides financial assistance, online support groups, medication/co-pay assistance.  1-800-813-HOPE (4673) ? [facility] Assists [place] cancer patients and their families through emotional , educational and financial support.  336-427-4357 ? [place]  DSS Where to apply for food stamps, Medicaid and utility assistance. 336-342-1394 ? [facility]: Transportation to medical appointments. 336-347-2287 ? Social Security Administration: May apply for disability if have a Stage IV cancer. 336-342-7796 1-800-772-1213 ? [place] Aging, Disability and Transit Services: Assists with nutrition, care and transit needs. 336-349-2343  Cancer Center Support Programs:   > Cancer Support Group  2nd Tuesday of the month 1pm-2pm, Journey Room   > Creative Journey  3rd Tuesday of the month 1130am-1pm, Journey Room    

## 2018-03-31 ENCOUNTER — Encounter: Payer: Self-pay | Admitting: *Deleted

## 2018-03-31 ENCOUNTER — Ambulatory Visit (INDEPENDENT_AMBULATORY_CARE_PROVIDER_SITE_OTHER): Payer: Medicare Other | Admitting: *Deleted

## 2018-03-31 VITALS — BP 109/61 | HR 63 | Ht 69.0 in | Wt 162.0 lb

## 2018-03-31 DIAGNOSIS — Z0000 Encounter for general adult medical examination without abnormal findings: Secondary | ICD-10-CM

## 2018-03-31 NOTE — Progress Notes (Addendum)
Subjective:   Lance Erickson is a 57 y.o. male who presents for an Initial Medicare Annual Wellness Visit. Lance Erickson is disabled and lives at home with is mother. He has 4 brothers and sisters. One brother passed away about 5 years ago at the age of 51. His dad passed about 20 years ago. He also had a close friend to die suddenly within the past few weeks. He is dealing with this ok. He stays active at home and maintains the yardwork and laundry.   Review of Systems  States that he feels about the same as last year.   Heme/onc: Diagnosed with leukemia. Not currently on treatment but is being watched by cancer center at [place] Factors include: advanced age (>62men, >65 women);male gender;sedentary lifestyle    Objective:    Today's Vitals   03/31/18 1024  BP: 109/61  Pulse: 63  Weight: 162 lb (73.5 kg)  Height: 5\' 9"  (1.753 m)   Body mass index is 23.92 kg/m.  Advanced Directives 03/31/2018 03/24/2018 12/17/2017 12/03/2017 11/16/2017 10/14/2017 03/23/2017  Does Patient Have a Medical Advance Directive? No No No No No No No  Would patient like information on creating a medical advance directive? No - Patient declined No - Patient declined No - Patient declined No - Patient declined No - Patient declined No - Patient declined Yes (MAU/Ambulatory/Procedural Areas - Information given)    Current Medications (verified) No outpatient encounter medications on file as of 03/31/2018.   No facility-administered encounter medications on file as of 03/31/2018.     Allergies (verified) Patient has no known allergies.   History: Past Medical History:  Diagnosis Date  . Cancer ([place]) 08/2015   skin, nose  . Leukemia ([place])   . Spinal bifida, closed    Past Surgical History:  Procedure Laterality Date  . FRACTURE SURGERY     left arm   . INGUINAL HERNIA REPAIR Right    Family History  Problem Relation Age of Onset  . Hypertension Mother   . Deep vein thrombosis Mother    . Hyperlipidemia Mother   . Cancer Mother        skin  . Heart attack Father   . Diabetes Brother   . Hypertension Brother   . Hyperlipidemia Brother   . Heart attack Brother   . Arthritis Brother        back pain  . Arthritis Brother        back pain  . Cancer Maternal Grandmother        varian and uterus cancer  . Heart attack Maternal Grandmother   . Alcohol abuse Maternal Grandfather   . Tuberculosis Maternal Grandfather   . Heart disease Paternal Grandmother   . Pneumonia Paternal Grandfather    Social History   Socioeconomic History  . Marital status: Single    Spouse name: Not on file  . Number of children: Not on file  . Years of education: Not on file  . Highest education level: Not on file  Occupational History  . Occupation: disabled  Social Needs  . Financial resource strain: Not hard at all  . Food insecurity:    Worry: Never true    Inability: Never true  . Transportation needs:    Medical: No    Non-medical: No  Tobacco Use  . Smoking status: Never Smoker  . Smokeless tobacco: Never Used  Substance and Sexual Activity  . Alcohol use: No  .  Drug use: No  . Sexual activity: Not Currently  Lifestyle  . Physical activity:    Days per week: Not on file    Minutes per session: Not on file  . Stress: Not at all  Relationships  . Social connections:    Talks on phone: More than three times a week    Gets together: More than three times a week    Attends religious service: More than 4 times per year    Active member of club or organization: Yes    Attends meetings of clubs or organizations: More than 4 times per year    Relationship status: Married  Other Topics Concern  . Not on file  Social History Narrative  . Not on file   Tobacco Counseling No current tobacco use   Clinical Intake:        Nutritional Status: BMI of 19-24  Normal Diabetes: No  How often do you need to have someone help you when you read instructions, pamphlets,  or other written materials from your doctor or pharmacy?: 3 - Sometimes What is the last grade level you completed in school?: 12(Special education classes)     Information entered by :: Chong Sicilian, RN  Activities of Daily Living In your present state of health, do you have any difficulty performing the following activities: 03/31/2018 12/03/2017  Hearing? N N  Vision? N N  Difficulty concentrating or making decisions? N N  Walking or climbing stairs? N N  Dressing or bathing? N N  Doing errands, shopping? N -  Preparing Food and eating ? N -  Using the Toilet? N -  In the past six months, have you accidently leaked urine? N -  Do you have problems with loss of bowel control? N -  Managing your Medications? N -  Managing your Finances? N -  Housekeeping or managing your Housekeeping? N -  Some recent data might be hidden     Immunizations and Health Maintenance Immunization History  Administered Date(s) Administered  . Influenza,inj,Quad PF,6+ Mos 08/23/2015, 08/27/2016, 08/26/2017  . Influenza-Unspecified 08/21/2014   Health Maintenance Due  Topic Date Due  . COLONOSCOPY  05/19/2012    Patient Care Team: [name], MD as PCP - General (Family Medicine) Druscilla Brownie, MD as Consulting Physician (Dermatology)  No hospitalizations, ER visits, or surgeries this past year.      Assessment:   This is a routine wellness examination for Lance Erickson.  Hearing/Vision screen No deficits noted during visit.   Dietary issues and exercise activities discussed: Current Exercise Habits: Home exercise routine, Type of exercise: walking, Time (Minutes): 30, Frequency (Times/Week): 1, Weekly Exercise (Minutes/Week): 30, Intensity: Mild, Exercise limited by: None identified  Goals    . Exercise 150 min/wk Moderate Activity      Depression Screen PHQ 2/9 Scores 03/31/2018 10/08/2017 03/23/2017 08/27/2016  PHQ - 2 Score 0 0 0 0    Fall Risk Fall Risk  03/31/2018 03/23/2017    Falls in the past year? No No    Is the patient's home free of loose throw rugs in walkways, pet beds, electrical cords, etc?   yes      Grab bars in the bathroom? no      Handrails on the stairs?   yes      Adequate lighting?   yes    Cognitive Function: MMSE - Mini Mental State Exam 03/31/2018 03/23/2017  Orientation to time 5 5  Orientation to Place 5 5  Registration  3 2  Attention/ Calculation 2 5  Recall 3 2  Language- name 2 objects 2 2  Language- repeat 1 1  Language- follow 3 step command 3 3  Language- read & follow direction 1 1  Write a sentence 1 1  Copy design 1 1  Total score 27 28        Screening Tests Health Maintenance  Topic Date Due  . COLONOSCOPY  05/19/2012  . TETANUS/TDAP  04/01/2019 (Originally 05/19/1980)  . Hepatitis C Screening  03/23/2022 (Originally Feb 05, 1961)  . HIV Screening  03/23/2024 (Originally [birth year]/19/1977)  . INFLUENZA VACCINE  06/10/2018      Plan:   Declined colonoscopy. Information on Cologuard given. Patient will consider.  Keep f/u with PCP Move carefully to avoid falls. Use assistive devices like a can or walker if needed. Aim for at least 150 minutes of moderate activity a week Watch portion sizes and try to sip on water throughout the day Reading or puzzles are a good way to exercise your brain Stay connected with friends and family. Social connections are beneficial to your emotional and mental health.    I have personally reviewed and noted the following in the patient's chart:   . Medical and social history . Use of alcohol, tobacco or illicit drugs  . Current medications and supplements . Functional ability and status . Nutritional status . Physical activity . Advanced directives . List of other physicians . Hospitalizations, surgeries, and ER visits in previous 12 months . Vitals . Screenings to include cognitive, depression, and falls . Referrals and appointments  In addition, I have reviewed and discussed  with patient certain preventive protocols, quality metrics, and best practice recommendations. A written personalized care plan for preventive services as well as general preventive health recommendations were provided to patient.     Chong Sicilian, RN   03/31/2018   I have reviewed and agree with the above AWV documentation.   Mary-Margaret Hassell Done, FNP

## 2018-03-31 NOTE — Patient Instructions (Signed)
  Lance Erickson , Thank you for taking time to come for your Medicare Wellness Visit. I appreciate your ongoing commitment to your health goals. Please review the following plan we discussed and let me know if I can assist you in the future.   These are the goals we discussed: Goals    . Exercise 150 min/wk Moderate Activity       This is a list of the screening recommended for you and due dates:  Health Maintenance  Topic Date Due  . Colon Cancer Screening  05/19/2012  . Tetanus Vaccine  04/01/2019*  .  Hepatitis C: One time screening is recommended by Center for Disease Control  (CDC) for  adults born from [birth year] through 1964.   03/23/2022*  . HIV Screening  03/23/2024*  . Flu Shot  06/10/2018  *Topic was postponed. The date shown is not the original due date.    Consider Cologuard for colon cancer screening. See handout.

## 2018-05-27 ENCOUNTER — Inpatient Hospital Stay (HOSPITAL_COMMUNITY): Payer: Medicare Other | Attending: Hematology

## 2018-05-27 DIAGNOSIS — Q5310 Unspecified undescended testicle, unilateral: Secondary | ICD-10-CM | POA: Insufficient documentation

## 2018-05-27 DIAGNOSIS — D696 Thrombocytopenia, unspecified: Secondary | ICD-10-CM | POA: Insufficient documentation

## 2018-05-27 DIAGNOSIS — R161 Splenomegaly, not elsewhere classified: Secondary | ICD-10-CM | POA: Insufficient documentation

## 2018-05-27 DIAGNOSIS — D479 Neoplasm of uncertain behavior of lymphoid, hematopoietic and related tissue, unspecified: Secondary | ICD-10-CM | POA: Diagnosis not present

## 2018-05-27 DIAGNOSIS — N200 Calculus of kidney: Secondary | ICD-10-CM | POA: Diagnosis not present

## 2018-05-27 DIAGNOSIS — Z79899 Other long term (current) drug therapy: Secondary | ICD-10-CM | POA: Diagnosis not present

## 2018-05-27 DIAGNOSIS — D72820 Lymphocytosis (symptomatic): Secondary | ICD-10-CM | POA: Insufficient documentation

## 2018-05-27 DIAGNOSIS — Q059 Spina bifida, unspecified: Secondary | ICD-10-CM | POA: Insufficient documentation

## 2018-05-27 LAB — CBC WITH DIFFERENTIAL/PLATELET
Basophils Absolute: 0.2 10*3/uL — ABNORMAL HIGH (ref 0.0–0.1)
Basophils Relative: 1 %
Eosinophils Absolute: 0.2 10*3/uL (ref 0.0–0.7)
Eosinophils Relative: 1 %
HCT: 38.2 % — ABNORMAL LOW (ref 39.0–52.0)
Hemoglobin: 12.2 g/dL — ABNORMAL LOW (ref 13.0–17.0)
LYMPHS PCT: 76 %
Lymphs Abs: 15.8 10*3/uL — ABNORMAL HIGH (ref 0.7–4.0)
MCH: 27.4 pg (ref 26.0–34.0)
MCHC: 31.9 g/dL (ref 30.0–36.0)
MCV: 85.7 fL (ref 78.0–100.0)
MONOS PCT: 3 %
Monocytes Absolute: 0.6 10*3/uL (ref 0.1–1.0)
Neutro Abs: 3.9 10*3/uL (ref 1.7–7.7)
Neutrophils Relative %: 19 %
Platelets: 117 10*3/uL — ABNORMAL LOW (ref 150–400)
RBC: 4.46 MIL/uL (ref 4.22–5.81)
RDW: 14.7 % (ref 11.5–15.5)
WBC: 20.7 10*3/uL — AB (ref 4.0–10.5)

## 2018-05-27 LAB — COMPREHENSIVE METABOLIC PANEL
ALBUMIN: 4 g/dL (ref 3.5–5.0)
ALK PHOS: 46 U/L (ref 38–126)
ALT: 12 U/L (ref 0–44)
ANION GAP: 6 (ref 5–15)
AST: 19 U/L (ref 15–41)
BUN: 28 mg/dL — ABNORMAL HIGH (ref 6–20)
CO2: 27 mmol/L (ref 22–32)
CREATININE: 0.97 mg/dL (ref 0.61–1.24)
Calcium: 9.5 mg/dL (ref 8.9–10.3)
Chloride: 108 mmol/L (ref 98–111)
GFR calc Af Amer: >60 mL/min (ref >60)
GFR calc non Af Amer: >60 mL/min (ref >60)
GLUCOSE: 103 mg/dL — AB (ref 70–99)
Potassium: 4.8 mmol/L (ref 3.5–5.1)
SODIUM: 141 mmol/L (ref 135–145)
Total Bilirubin: 0.5 mg/dL (ref 0.3–1.2)
Total Protein: 6.7 g/dL (ref 6.5–8.1)

## 2018-05-27 LAB — SEDIMENTATION RATE: Sed Rate: 2 mm/hr (ref 0–16)

## 2018-05-27 LAB — LACTATE DEHYDROGENASE: LDH: 106 U/L (ref 98–192)

## 2018-05-28 LAB — BETA 2 MICROGLOBULIN, SERUM: BETA 2 MICROGLOBULIN: 2.8 mg/L — AB (ref 0.6–2.4)

## 2018-05-31 ENCOUNTER — Encounter (HOSPITAL_COMMUNITY)
Admission: RE | Admit: 2018-05-31 | Discharge: 2018-05-31 | Disposition: A | Discharge status: Home / Self Care | Payer: Medicare Other | Source: Ambulatory Visit | Attending: Internal Medicine | Admitting: Internal Medicine

## 2018-05-31 DIAGNOSIS — N200 Calculus of kidney: Secondary | ICD-10-CM | POA: Diagnosis not present

## 2018-05-31 DIAGNOSIS — D479 Neoplasm of uncertain behavior of lymphoid, hematopoietic and related tissue, unspecified: Secondary | ICD-10-CM | POA: Insufficient documentation

## 2018-05-31 DIAGNOSIS — C8590 Non-Hodgkin lymphoma, unspecified, unspecified site: Secondary | ICD-10-CM | POA: Diagnosis not present

## 2018-05-31 DIAGNOSIS — R161 Splenomegaly, not elsewhere classified: Secondary | ICD-10-CM | POA: Diagnosis not present

## 2018-05-31 DIAGNOSIS — R590 Localized enlarged lymph nodes: Secondary | ICD-10-CM | POA: Diagnosis not present

## 2018-05-31 DIAGNOSIS — Q5310 Unspecified undescended testicle, unilateral: Secondary | ICD-10-CM | POA: Diagnosis not present

## 2018-05-31 LAB — PATHOLOGIST SMEAR REVIEW

## 2018-05-31 MED ORDER — FLUDEOXYGLUCOSE F - 18 (FDG) INJECTION
9.3800 | Freq: Once | INTRAVENOUS | Status: AC | PRN
  Administered 2018-05-31: 9.38 via INTRAVENOUS

## 2018-06-03 ENCOUNTER — Inpatient Hospital Stay (HOSPITAL_BASED_OUTPATIENT_CLINIC_OR_DEPARTMENT_OTHER): Payer: Medicare Other | Admitting: Internal Medicine

## 2018-06-03 ENCOUNTER — Encounter (HOSPITAL_COMMUNITY): Payer: Self-pay | Admitting: Internal Medicine

## 2018-06-03 VITALS — BP 113/66 | HR 65 | Temp 98.0°F | Resp 16 | Wt 158.4 lb

## 2018-06-03 DIAGNOSIS — D472 Monoclonal gammopathy: Secondary | ICD-10-CM

## 2018-06-03 DIAGNOSIS — D479 Neoplasm of uncertain behavior of lymphoid, hematopoietic and related tissue, unspecified: Secondary | ICD-10-CM | POA: Diagnosis not present

## 2018-06-03 DIAGNOSIS — D72820 Lymphocytosis (symptomatic): Secondary | ICD-10-CM

## 2018-06-03 DIAGNOSIS — Z79899 Other long term (current) drug therapy: Secondary | ICD-10-CM | POA: Diagnosis not present

## 2018-06-03 DIAGNOSIS — D696 Thrombocytopenia, unspecified: Secondary | ICD-10-CM

## 2018-06-03 DIAGNOSIS — R161 Splenomegaly, not elsewhere classified: Secondary | ICD-10-CM | POA: Diagnosis not present

## 2018-06-03 DIAGNOSIS — Q059 Spina bifida, unspecified: Secondary | ICD-10-CM | POA: Diagnosis not present

## 2018-06-03 DIAGNOSIS — Q5310 Unspecified undescended testicle, unilateral: Secondary | ICD-10-CM

## 2018-06-03 DIAGNOSIS — N200 Calculus of kidney: Secondary | ICD-10-CM | POA: Diagnosis not present

## 2018-06-03 NOTE — Patient Instructions (Signed)
[facility] at [facility] Discharge Instructions  You saw Dr. Higgs today.   Thank you for choosing  Cancer Center at [facility] to provide your oncology and hematology care.  To afford each patient quality time with our provider, please arrive at least 15 minutes before your scheduled appointment time.   If you have a lab appointment with the Cancer Center please come in thru the  Main Entrance and check in at the main information desk  You need to re-schedule your appointment should you arrive 10 or more minutes late.  We strive to give you quality time with our providers, and arriving late affects you and other patients whose appointments are after yours.  Also, if you no show three or more times for appointments you may be dismissed from the clinic at the providers discretion.     Again, thank you for choosing [facility].  Our hope is that these requests will decrease the amount of time that you wait before being seen by our physicians.       _____________________________________________________________  Should you have questions after your visit to [facility], please contact our office at [phone] between the hours of 8:30 a.m. and 4:30 p.m.  Voicemails left after 4:30 p.m. will not be returned until the following business day.  For prescription refill requests, have your pharmacy contact our office.       Resources For Cancer Patients and their Caregivers ? American Cancer Society: Can assist with transportation, wigs, general needs, runs Look Good Feel Better.        1-888-227-6333 ? Cancer Care: Provides financial assistance, online support groups, medication/co-pay assistance.  1-800-813-HOPE (4673) ? [facility] Assists [place] cancer patients and their families through emotional , educational and financial support.  336-427-4357 ? [place] DSS Where to apply for food  stamps, Medicaid and utility assistance. 336-342-1394 ? [facility]: Transportation to medical appointments. 336-347-2287 ? Social Security Administration: May apply for disability if have a Stage IV cancer. 336-342-7796 1-800-772-1213 ? [place] [facility]ging, Disability and Transit Services: Assists with nutrition, care and transit needs. 336-349-2343  Cancer Center Support Programs:   > Cancer Support Group  2nd Tuesday of the month 1pm-2pm, Journey Room   > Creative Journey  3rd Tuesday of the month 1130am-1pm, Journey Room     

## 2018-06-03 NOTE — Progress Notes (Signed)
Diagnosis Lymphoproliferative disease ([place]) - Plan: CBC with Differential/Platelet, Comprehensive metabolic panel, Lactate dehydrogenase, Beta 2 microglobulin, serum  Staging Cancer Staging No matching staging information was found for the patient.  Assessment and Plan:  1.  B-cell neoplasm/CLLL versus MCL:  The pt denies any fevers, chills, night sweats, and has noted no adenopathy.  He denies any abdominal pain.  He is here to go over labs and scans.  He is accompanied by his mother.    Pet scan done 05/31/2018 shows  IMPRESSION: 1. Stable exam. Low level uptake identified in bilateral axillary and right groin lymphadenopathy without substantial interval change. 2. Stable splenomegaly. 3. As noted previously, undescended right testicle a possibility based on imaging today. 4. Stable appearance tiny left paraumbilical hernia. 5. Bilateral nephrolithiasis with potential 10 x 7 mm distal left ureteral stone without obstructive changes in the left kidney or ureter.   Pt has undergone Bone marrow biopsy that showed: Bone Marrow, Aspirate,Biopsy, and Clot, right iliac done on 12/03/2017 showed:  - SLIGHTLY HYPERCELLULAR BONE MARROW WITH INVOLVEMENT BY NON-HODGKIN'S B-CELL LYMPHOMA. - A MINOR POPULATION OF MONOCLONAL PLASMA CELLS. - SEE COMMENT. PERIPHERAL BLOOD: - LYMPHOCYTOSIS. - THROMBOCYTOPENIA. Diagnosis Note The bone marrow is prominently involved by a B-cell lymphoproliferative process of primarily small lymphoid cells expressing pan B-cell antigens including CD20 associated with CD5 and cyclin D1 expression. The findings are most consistent with involvement by mantle cell lymphoma, but correlation with cytogenetic and FISH studies is recommended. There is also a minor population of monoclonal, kappa-restricted plasma cells representing 2% of cells in the aspirate associated with small clusters in the clot and biopsy sections that may not be necessarily part of the lymphoid  neoplastic process and possibly represents a separate disease (plasma cell neoplasm/MGUS). Correlation with serum protein electrophoresis and immunofixation electrophoresis is recommended. (BNS:ecj:ah 12/04/26/201  Fish study done 11/2017 showed normal cytogenetics and was negative for T 11;14.    His MIPI score placed  him in low risk category.    Labs done 05/27/2018 reviewed with pt and showed WBC 20.7 HB 12.2 plts 117,00.    Pet scan done 05/2018 shows stable findings as well as splenomegaly.  Plt count is stable with last visit.  He has no evidence of anemia and has a plt count greater than 100,000.    He remains asymptomatic and has no palpable adenopathy on exam.  If any changes in symptoms or worsening adenopathy he will be set up for LN biopsy (likely axillary) to correlate with bone marrow biopsy results.    While most patients with MCL who do not begin therapy will die of their disease within a few years, a small proportion, increasingly better defined, may remain stable for years. These occasional patients with low stage low-risk disease may have an indolent course, managed by observation, splenectomy, or treatment.    Pt's  clinical picture appears to currently indolent.  Pt will continue to be followed closely and should notify the office if any change in symptoms prior to next visit.    2.  Monoclonal gammopathy of unknown significance: 2% clone of plasma cells was detected in the bone marrow suggestive of monoclonal gammopathy of unknown significance.  SPEP done 03/18/2018 shows IgG monoclonal protein with kappa light chain  specificity.  M spike is 1.1 g/dl.  He will have repeat labs done in 3-4 months.  Pt shows no evidence of anemia, RI or hypercalcemia.    3. Enlarged prostate and undescended testicle.  Pt asymptomatic.  Last PSA was 3.6 in November 2018.  Continue to follow-up with PCP or urology.     4.  Spina Bifida.  Follow-up with PCP.    Interval history: Historical data  obtained from the note dated 03/24/2018.  57 y.o. male initially seen by Dr. Talbert Cage for leukocytosis with his mother.  Patient had a CBC performed on 10/08/2017 which demonstrated WBC 15.3 K, hemoglobin 12.9 g/dL, hematocrit 38.5%, platelet count 138K, differential demonstrated elevated absolute lymphocyte count of 11.2 K.  Previous CBC from 4 years ago demonstrated WBC 13.4 K, absolute lymphocyte count of 7.4K.    He has subsequent evaluation by Dr Lebron Conners and pt was found to have monoclonal B-cell process with differential diagnosis including CLL and mantle cell lymphoma.  He was recommended for PET scan and bone marrow biopsy.    Pt was seen by Dr. Sherrine Maples PET scan done on 12/10/2017 showed  IMPRESSION :   1. Low-level hypermetabolism corresponding to adenopathy within the bilateral axillas and right inguinal regions. Findings likely represent relatively low-grade lymphoma.Bilateral axillary adenopathy with low-level hypermetabolism. Example right axillary node which measures 3.2 x 1.5 cm and a S.U.V. max of 2.2 on image 54/series 4. Left axillary node of 2.8 x 1.7 cm and a S.U.V. max of 2.2 on image 55/series 4. Heart size accentuated by a pectus excavatum deformity. Prevascular nodes are not pathologic by size criteria. ABDOMEN/PELVIS: Hypermetabolic right inguinal node(s). More lateral node measures 1.1 cm and a S.U.V. max of 2.3 on image 183/series 4. A more medial focus of hypermetabolism measures 1.9 cm and a S.U.V. max of 2.7 on image 188/series 4. 2. Nonspecific splenomegaly, without significant splenic hypermetabolism. Cannot exclude splenic involvement. 3. Small cervical and anterior mediastinal nodes are nonspecific. 4. Medial right inguinal hypermetabolism could also represent an enlarged lymph node or inguinal position of the right testicle. Consider physical exam correlation. 5. Prostatomegaly. 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 Consider correlation with PSA level. 6. Ventral  abdominal wall hernia containing fat. Possible edema within the fat. Consider physical exam correlation to exclude incarceration. 7. Bilateral nephrolithiasis.   Bone Marrow, Aspirate,Biopsy, and Clot, right iliac done on 12/03/2017 showed:  - SLIGHTLY HYPERCELLULAR BONE MARROW WITH INVOLVEMENT BY NON-HODGKIN'S B-CELL LYMPHOMA. - A MINOR POPULATION OF MONOCLONAL PLASMA CELLS. - SEE COMMENT. PERIPHERAL BLOOD: - LYMPHOCYTOSIS. - THROMBOCYTOPENIA. Diagnosis Note The bone marrow is prominently involved by a B-cell lymphoproliferative process of primarily small lymphoid cells expressing pan B-cell antigens including CD20 associated with CD5 and cyclin D1 expression. The findings are most consistent with involvement by mantle cell lymphoma, but correlation with cytogenetic and FISH studies is recommended. There is also a minor population of monoclonal, kappa-restricted plasma cells representing 2% of cells in the aspirate associated with small clusters in the clot and biopsy sections that may not be necessarily part of the lymphoid neoplastic process and possibly represents a separate disease (plasma cell neoplasm/MGUS). Correlation with serum protein electrophoresis and immunofixation electrophoresis is recommended. (BNS:ecj:ah 12/04/26/201  Bone marrow FISH panel for CLL and mantle cell lymphoma reported from cytogenetic lab at [facility] dated 12/03/2017 report shows absence of   t( 11: 14) translocation    Current Status:  Pt is seen today for follow-up.  He is here to go over labs and Pet scan.  Patient denies any fevers, chills, night sweats, adenopathy, abdominal pain.  Problem List Patient Active Problem List   Diagnosis Date Noted  . Lymphoproliferative disease ([place]) [D47.9] 11/16/2017  . Spina bifida ([place]) [Q05.9] 05/02/2015  . Annual  physical exam [Z00.00] 05/02/2015    Past Medical History Past Medical History:  Diagnosis Date  . Cancer ([place]) 08/2015   skin, nose  . Leukemia  ([place])   . Spinal bifida, closed     Past Surgical History Past Surgical History:  Procedure Laterality Date  . FRACTURE SURGERY     left arm   . INGUINAL HERNIA REPAIR Right     Family History Family History  Problem Relation Age of Onset  . Hypertension Mother   . Deep vein thrombosis Mother   . Hyperlipidemia Mother   . Cancer Mother        skin  . Heart attack Father   . Diabetes Brother   . Hypertension Brother   . Hyperlipidemia Brother   . Heart attack Brother   . Arthritis Brother        back pain  . Arthritis Brother        back pain  . Cancer Maternal Grandmother        varian and uterus cancer  . Heart attack Maternal Grandmother   . Alcohol abuse Maternal Grandfather   . Tuberculosis Maternal Grandfather   . Heart disease Paternal Grandmother   . Pneumonia Paternal Grandfather      Social History  reports that he has never smoked. He has never used smokeless tobacco. He reports that he does not drink alcohol or use drugs.  Medications No current outpatient medications on file.  Allergies Patient has no known allergies.  Review of Systems Review of Systems - Oncology ROS negative   Physical Exam  Vitals Wt Readings from Last 3 Encounters:  06/03/18 158 lb 6.4 oz (71.8 kg)  03/31/18 162 lb (73.5 kg)  03/24/18 161 lb 12.8 oz (73.4 kg)   Temp Readings from Last 3 Encounters:  06/03/18 98 F (36.7 C) (Oral)  03/24/18 97.6 F (36.4 C) (Oral)  12/17/17 98 F (36.7 C) (Oral)   BP Readings from Last 3 Encounters:  06/03/18 113/66  03/31/18 109/61  03/24/18 124/63   Pulse Readings from Last 3 Encounters:  06/03/18 65  03/31/18 63  03/24/18 60   Constitutional: Well-developed, well-nourished, and in no distress.   HENT: Head: Normocephalic and atraumatic.  Mouth/Throat: No oropharyngeal exudate. Mucosa moist. Eyes: Pupils are equal, round, and reactive to light. Conjunctivae are normal. No scleral icterus.  Neck: Normal range of  motion. Neck supple. No JVD present.  Cardiovascular: Normal rate, regular rhythm and normal heart sounds.  Exam reveals no gallop and no friction rub.   No murmur heard. Pulmonary/Chest: Effort normal and breath sounds normal. No respiratory distress. No wheezes.No rales.  Abdominal: Soft. Bowel sounds are normal. No distension. There is no tenderness. There is no guarding.  Musculoskeletal: No edema or tenderness.  Lymphadenopathy: No cervical, axillary or supraclavicular adenopathy.  Neurological: Alert and oriented to person, place, and time. No cranial nerve deficit.  Skin: Skin is warm and dry. No rash noted. No erythema. No pallor.  Psychiatric: Affect and judgment normal.   Labs No visits with results within 3 Day(s) from this visit.  Latest known visit with results is:  Appointment on 05/27/2018  Component Date Value Ref Range Status  . WBC 05/27/2018 20.7* 4.0 - 10.5 K/uL Final   WHITE COUNT CONFIRMED ON SMEAR  . RBC 05/27/2018 4.46  4.22 - 5.81 MIL/uL Final  . Hemoglobin 05/27/2018 12.2* 13.0 - 17.0 g/dL Final  . HCT 05/27/2018 38.2* 39.0 - 52.0 % Final  . MCV 05/27/2018  85.7  78.0 - 100.0 fL Final  . MCH 05/27/2018 27.4  26.0 - 34.0 pg Final  . MCHC 05/27/2018 31.9  30.0 - 36.0 g/dL Final  . RDW 05/27/2018 14.7  11.5 - 15.5 % Final  . Platelets 05/27/2018 117* 150 - 400 K/uL Final   Comment: SPECIMEN CHECKED FOR CLOTS PLATELET COUNT CONFIRMED BY SMEAR   . Neutrophils Relative % 05/27/2018 19  % Final  . Lymphocytes Relative 05/27/2018 76  % Final  . Monocytes Relative 05/27/2018 3  % Final  . Eosinophils Relative 05/27/2018 1  % Final  . Basophils Relative 05/27/2018 1  % Final  . Neutro Abs 05/27/2018 3.9  1.7 - 7.7 K/uL Final  . Lymphs Abs 05/27/2018 15.8* 0.7 - 4.0 K/uL Final  . Monocytes Absolute 05/27/2018 0.6  0.1 - 1.0 K/uL Final  . Eosinophils Absolute 05/27/2018 0.2  0.0 - 0.7 K/uL Final  . Basophils Absolute 05/27/2018 0.2* 0.0 - 0.1 K/uL Final  . WBC  Morphology 05/27/2018 ABSOLUTE LYMPHOCYTOSIS   Final   ATYPICAL LYMPHOCYTES  . Smear Review 05/27/2018 PENDING PATHOLOGIST REVIEW   Final   Performed at [facility], [address] [place], [place] [address]  . Sodium 05/27/2018 141  135 - 145 mmol/L Final  . Potassium 05/27/2018 4.8  3.5 - 5.1 mmol/L Final  . Chloride 05/27/2018 108  98 - 111 mmol/L Final   Please note change in reference range.  . CO2 05/27/2018 27  22 - 32 mmol/L Final  . Glucose, Bld 05/27/2018 103* 70 - 99 mg/dL Final   Please note change in reference range.  . BUN 05/27/2018 28* 6 - 20 mg/dL Final   Please note change in reference range.  . Creatinine, Ser 05/27/2018 0.97  0.61 - 1.24 mg/dL Final  . Calcium 05/27/2018 9.5  8.9 - 10.3 mg/dL Final  . Total Protein 05/27/2018 6.7  6.5 - 8.1 g/dL Final  . Albumin 05/27/2018 4.0  3.5 - 5.0 g/dL Final  . AST 05/27/2018 19  15 - 41 U/L Final  . ALT 05/27/2018 12  0 - 44 U/L Final   Please note change in reference range.  . Alkaline Phosphatase 05/27/2018 46  38 - 126 U/L Final  . Total Bilirubin 05/27/2018 0.5  0.3 - 1.2 mg/dL Final  . GFR calc non Af Amer 05/27/2018 >60  >60 mL/min Final  . GFR calc Af Amer 05/27/2018 >60  >60 mL/min Final   Comment: (NOTE) The eGFR has been calculated using the CKD EPI equation. This calculation has not been validated in all clinical situations. eGFR's persistently <60 mL/min signify possible Chronic Kidney Disease.   Georgiann Hahn gap 05/27/2018 6  5 - 15 Final   Performed at [facility], [address], [place], [place] [address]  . LDH 05/27/2018 106  98 - 192 U/L Final   Performed at [facility], [address], [place], [place] [address]  . Sed Rate 05/27/2018 2  0 - 16 mm/hr Final   Performed at [facility], [address], [place], [place] [address]  . Beta-2 Microglobulin 05/27/2018 2.8* 0.6 - 2.4 mg/L Final   Comment: (NOTE) Siemens Immulite 2000 Immunochemiluminometric assay (ICMA) Values obtained with different assay  methods or kits cannot be used interchangeably. Results cannot be interpreted as absolute evidence of the presence or absence of malignant disease. Performed At: [facility] [address] [place], [place] [id number] Rush Farmer MD IN:[id number]      Pathology Orders Placed This  Encounter  Procedures  . CBC with Differential/Platelet    Standing Status:   Future    Standing Expiration Date:   06/04/2019  . Comprehensive metabolic panel    Standing Status:   Future    Standing Expiration Date:   06/04/2019  . Lactate dehydrogenase    Standing Status:   Future    Standing Expiration Date:   06/04/2019  . Beta 2 microglobulin, serum    Standing Status:   Future    Standing Expiration Date:   06/04/2019       Zoila Shutter MD

## 2018-08-16 ENCOUNTER — Ambulatory Visit (INDEPENDENT_AMBULATORY_CARE_PROVIDER_SITE_OTHER): Payer: Medicare Other | Admitting: *Deleted

## 2018-08-16 DIAGNOSIS — Z23 Encounter for immunization: Secondary | ICD-10-CM

## 2018-09-06 ENCOUNTER — Inpatient Hospital Stay (HOSPITAL_COMMUNITY): Payer: Medicare Other | Attending: Hematology

## 2018-09-06 DIAGNOSIS — D472 Monoclonal gammopathy: Secondary | ICD-10-CM

## 2018-09-06 DIAGNOSIS — D479 Neoplasm of uncertain behavior of lymphoid, hematopoietic and related tissue, unspecified: Secondary | ICD-10-CM

## 2018-09-06 LAB — CBC WITH DIFFERENTIAL/PLATELET
ABS IMMATURE GRANULOCYTES: 0.03 10*3/uL (ref 0.00–0.07)
BASOS ABS: 0.1 10*3/uL (ref 0.0–0.1)
Basophils Relative: 1 %
Eosinophils Absolute: 0.2 10*3/uL (ref 0.0–0.5)
Eosinophils Relative: 1 %
HCT: 40.8 % (ref 39.0–52.0)
Hemoglobin: 12.6 g/dL — ABNORMAL LOW (ref 13.0–17.0)
Immature Granulocytes: 0 %
LYMPHS ABS: 14.1 10*3/uL — AB (ref 0.7–4.0)
LYMPHS PCT: 79 %
MCH: 27.1 pg (ref 26.0–34.0)
MCHC: 30.9 g/dL (ref 30.0–36.0)
MCV: 87.7 fL (ref 80.0–100.0)
Monocytes Absolute: 0.6 10*3/uL (ref 0.1–1.0)
Monocytes Relative: 4 %
NEUTROS PCT: 15 %
NRBC: 0 % (ref 0.0–0.2)
Neutro Abs: 2.7 10*3/uL (ref 1.7–7.7)
Platelets: 106 10*3/uL — ABNORMAL LOW (ref 150–400)
RBC: 4.65 MIL/uL (ref 4.22–5.81)
RDW: 14.7 % (ref 11.5–15.5)
WBC: 17.8 10*3/uL — AB (ref 4.0–10.5)

## 2018-09-06 LAB — LACTATE DEHYDROGENASE: LDH: 108 U/L (ref 98–192)

## 2018-09-06 LAB — COMPREHENSIVE METABOLIC PANEL
ALBUMIN: 4 g/dL (ref 3.5–5.0)
ALK PHOS: 41 U/L (ref 38–126)
ALT: 12 U/L (ref 0–44)
ANION GAP: 6 (ref 5–15)
AST: 17 U/L (ref 15–41)
BUN: 25 mg/dL — ABNORMAL HIGH (ref 6–20)
CALCIUM: 8.9 mg/dL (ref 8.9–10.3)
CO2: 26 mmol/L (ref 22–32)
Chloride: 107 mmol/L (ref 98–111)
Creatinine, Ser: 1.03 mg/dL (ref 0.61–1.24)
GFR calc Af Amer: >60 mL/min (ref >60)
GFR calc non Af Amer: >60 mL/min (ref >60)
GLUCOSE: 89 mg/dL (ref 70–99)
Potassium: 4.1 mmol/L (ref 3.5–5.1)
SODIUM: 139 mmol/L (ref 135–145)
Total Bilirubin: 1 mg/dL (ref 0.3–1.2)
Total Protein: 6.9 g/dL (ref 6.5–8.1)

## 2018-09-07 LAB — KAPPA/LAMBDA LIGHT CHAINS
Kappa free light chain: 96.1 mg/L — ABNORMAL HIGH (ref 3.3–19.4)
Kappa, lambda light chain ratio: 12.01 — ABNORMAL HIGH (ref 0.26–1.65)
Lambda free light chains: 8 mg/L (ref 5.7–26.3)

## 2018-09-07 LAB — PROTEIN ELECTROPHORESIS, SERUM
A/G Ratio: 1.5 (ref 0.7–1.7)
Albumin ELP: 3.7 g/dL (ref 2.9–4.4)
Alpha-1-Globulin: 0.2 g/dL (ref 0.0–0.4)
Alpha-2-Globulin: 0.5 g/dL (ref 0.4–1.0)
Beta Globulin: 1.5 g/dL — ABNORMAL HIGH (ref 0.7–1.3)
GAMMA GLOBULIN: 0.3 g/dL — AB (ref 0.4–1.8)
Globulin, Total: 2.5 g/dL (ref 2.2–3.9)
M-Spike, %: 0.7 g/dL — ABNORMAL HIGH
TOTAL PROTEIN ELP: 6.2 g/dL (ref 6.0–8.5)

## 2018-09-07 LAB — IGG, IGA, IGM
IGG (IMMUNOGLOBIN G), SERUM: 1146 mg/dL (ref 700–1600)
IgA: 32 mg/dL — ABNORMAL LOW (ref 90–386)
IgM (Immunoglobulin M), Srm: 20 mg/dL (ref 20–172)

## 2018-09-07 LAB — BETA 2 MICROGLOBULIN, SERUM: BETA 2 MICROGLOBULIN: 2.2 mg/L (ref 0.6–2.4)

## 2018-09-13 ENCOUNTER — Encounter (HOSPITAL_COMMUNITY): Payer: Self-pay | Admitting: Internal Medicine

## 2018-09-13 ENCOUNTER — Other Ambulatory Visit: Payer: Self-pay

## 2018-09-13 ENCOUNTER — Ambulatory Visit (HOSPITAL_COMMUNITY)
Admission: RE | Admit: 2018-09-13 | Discharge: 2018-09-13 | Disposition: A | Discharge status: Home / Self Care | Payer: Medicare Other | Source: Ambulatory Visit | Attending: Internal Medicine | Admitting: Internal Medicine

## 2018-09-13 ENCOUNTER — Inpatient Hospital Stay (HOSPITAL_COMMUNITY): Payer: Medicare Other | Attending: Internal Medicine | Admitting: Internal Medicine

## 2018-09-13 VITALS — BP 107/63 | HR 51 | Temp 97.6°F | Resp 16 | Wt 164.1 lb

## 2018-09-13 DIAGNOSIS — K439 Ventral hernia without obstruction or gangrene: Secondary | ICD-10-CM | POA: Diagnosis not present

## 2018-09-13 DIAGNOSIS — N400 Enlarged prostate without lower urinary tract symptoms: Secondary | ICD-10-CM | POA: Diagnosis not present

## 2018-09-13 DIAGNOSIS — D479 Neoplasm of uncertain behavior of lymphoid, hematopoietic and related tissue, unspecified: Secondary | ICD-10-CM

## 2018-09-13 DIAGNOSIS — D472 Monoclonal gammopathy: Secondary | ICD-10-CM | POA: Insufficient documentation

## 2018-09-13 DIAGNOSIS — R161 Splenomegaly, not elsewhere classified: Secondary | ICD-10-CM | POA: Diagnosis not present

## 2018-09-13 DIAGNOSIS — R590 Localized enlarged lymph nodes: Secondary | ICD-10-CM | POA: Insufficient documentation

## 2018-09-13 NOTE — Patient Instructions (Signed)
[place] at [facility]  Discharge Instructions: You saw Dr Walden Field today for your appointment. _______________________________________________________________  Thank you for choosing [place] at [facility] to provide your oncology and hematology care.  To afford each patient quality time with our providers, please arrive at least 15 minutes before your scheduled appointment.  You need to re-schedule your appointment if you arrive 10 or more minutes late.  We strive to give you quality time with our providers, and arriving late affects you and other patients whose appointments are after yours.  Also, if you no show three or more times for appointments you may be dismissed from the clinic.  Again, thank you for choosing [place] [facility] [place] hope is that these requests will allow you access to exceptional care and in a timely manner. _______________________________________________________________  If you have questions after your visit, please contact our office at [phone] between the hours of 8:30 a.m. and 5:00 p.m. Voicemails left after 4:30 p.m. will not be returned until the following business day. _______________________________________________________________  For prescription refill requests, have your pharmacy contact our office. _______________________________________________________________  Recommendations made by the consultant and any test results will be sent to your referring physician. _______________________________________________________________

## 2018-09-13 NOTE — Progress Notes (Signed)
Diagnosis Monoclonal gammopathy  Lymphoproliferative disease ([place]) - Plan: DG Bone Survey Met, CBC with Differential/Platelet, Comprehensive metabolic panel, Lactate dehydrogenase, Protein electrophoresis, serum, IgG, IgA, IgM, Kappa/lambda light chains, NM PET Image Restag (PS) Skull Base To Thigh  Staging Cancer Staging No matching staging information was found for the patient.  Assessment and Plan:  1.  B-cell neoplasm/CLL versus MCL:  The pt denies any fevers, chills, night sweats, and has noted no adenopathy.  He denies any abdominal pain.  He is here to go over labs and scans.  He is accompanied by his mother.    Pet scan done 05/31/2018 shows  IMPRESSION: 1. Stable exam. Low level uptake identified in bilateral axillary and right groin lymphadenopathy without substantial interval change. 2. Stable splenomegaly. 3. As noted previously, undescended right testicle a possibility based on imaging today. 4. Stable appearance tiny left paraumbilical hernia. 5. Bilateral nephrolithiasis with potential 10 x 7 mm distal left ureteral stone without obstructive changes in the left kidney or ureter.   Pt has undergone Bone marrow biopsy that showed: Bone Marrow, Aspirate,Biopsy, and Clot, right iliac done on 12/03/2017 showed:  - SLIGHTLY HYPERCELLULAR BONE MARROW WITH INVOLVEMENT BY NON-HODGKIN'S B-CELL LYMPHOMA. - A MINOR POPULATION OF MONOCLONAL PLASMA CELLS. - SEE COMMENT. PERIPHERAL BLOOD: - LYMPHOCYTOSIS. - THROMBOCYTOPENIA. Diagnosis Note The bone marrow is prominently involved by a B-cell lymphoproliferative process of primarily small lymphoid cells expressing pan B-cell antigens including CD20 associated with CD5 and cyclin D1 expression. The findings are most consistent with involvement by mantle cell lymphoma, but correlation with cytogenetic and FISH studies is recommended. There is also a minor population of monoclonal, kappa-restricted plasma cells representing 2% of  cells in the aspirate associated with small clusters in the clot and biopsy sections that may not be necessarily part of the lymphoid neoplastic process and possibly represents a separate disease (plasma cell neoplasm/MGUS). Correlation with serum protein electrophoresis and immunofixation electrophoresis is recommended. (BNS:ecj:ah 12/04/26/201  Fish study done 11/2017 showed normal cytogenetics and was negative for T 11;14.    His MIPI score placed  him in low risk category.    Pet scan done 05/2018 shows stable findings as well as splenomegaly.    Labs done 09/06/2018 reviewed and showed WBC 17.8 HB 12.6 plts 106,000.  Chemistries WNL with K+ 4.1 Cr 1.03 and normal LFTs.  LDH 108.    WBC is improved from last visit.  Plt count is stable with last visit.  He has no evidence of anemia and has a plt count greater than 100,000.    He remains asymptomatic and has no palpable adenopathy on exam.  If any changes in symptoms or worsening adenopathy he will be set up for LN biopsy (likely axillary) to correlate with bone marrow biopsy results.    While most patients with MCL who do not begin therapy will die of their disease within a few years, a small proportion, increasingly better defined, may remain stable for years. These occasional patients with low stage low-risk disease may have an indolent course, managed by observation, splenectomy, or treatment.    Pt's  clinical picture appears to currently indolent.  Pt will continue to be followed closely and should notify the office if any change in symptoms prior to next visit.  He will be set up for PET scan in 11/2018 for follow-up  2.  Monoclonal gammopathy of unknown significance: 2% clone of plasma cells was detected in the bone marrow suggestive of monoclonal gammopathy of unknown significance.  SPEP done 03/18/2018 shows IgG monoclonal protein with kappa light chain specificity.  M spike on labs done 09/06/2018 is 0.7 g/dl compared to m-spike of1.1  g/dl on labs done in 03/2018.  K/L ratio is 12.  Quantitative IG show IGM of 20.   Pt had skeletal survey done today 09/13/2018 that was reviewed and showed  Impression: No definite evidence of lytic lesion or destruction.    Pt shows no evidence of anemia, RI or hypercalcemia.  He will remain on observation with repeat labs in 11/2018 on RTC.    3. Enlarged prostate and undescended testicle.  Pt asymptomatic.   Last PSA was 3.6 in November 2018.  Continue to follow-up with PCP or urology.     4.  Spina Bifida.  Follow-up with PCP.    25 minutes spent with more than 50% spent in counseling and coordination of care.    Interval history: Historical data obtained from the note dated 03/24/2018.  57 y.o. male initially seen by Dr. Talbert Cage for leukocytosis with his mother.  Patient had a CBC performed on 10/08/2017 which demonstrated WBC 15.3 K, hemoglobin 12.9 g/dL, hematocrit 38.5%, platelet count 138K, differential demonstrated elevated absolute lymphocyte count of 11.2 K.  Previous CBC from 4 years ago demonstrated WBC 13.4 K, absolute lymphocyte count of 7.4K.    He has subsequent evaluation by Dr Lebron Conners and pt was found to have monoclonal B-cell process with differential diagnosis including CLL and mantle cell lymphoma.  He was recommended for PET scan and bone marrow biopsy.    Pt was seen by Dr. Sherrine Maples PET scan done on 12/10/2017 showed  IMPRESSION :   1. Low-level hypermetabolism corresponding to adenopathy within the bilateral axillas and right inguinal regions. Findings likely represent relatively low-grade lymphoma.Bilateral axillary adenopathy with low-level hypermetabolism. Example right axillary node which measures 3.2 x 1.5 cm and a S.U.V. max of 2.2 on image 54/series 4. Left axillary node of 2.8 x 1.7 cm and a S.U.V. max of 2.2 on image 55/series 4. Heart size accentuated by a pectus excavatum deformity. Prevascular nodes are not pathologic by size criteria. ABDOMEN/PELVIS:  Hypermetabolic right inguinal node(s). More lateral node measures 1.1 cm and a S.U.V. max of 2.3 on image 183/series 4. A more medial focus of hypermetabolism measures 1.9 cm and a S.U.V. max of 2.7 on image 188/series 4. 2. Nonspecific splenomegaly, without significant splenic hypermetabolism. Cannot exclude splenic involvement. 3. Small cervical and anterior mediastinal nodes are nonspecific. 4. Medial right inguinal hypermetabolism could also represent an enlarged lymph node or inguinal position of the right testicle. Consider physical exam correlation. 5. Prostatomegaly. 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 Consider correlation with PSA level. 6. Ventral abdominal wall hernia containing fat. Possible edema within the fat. Consider physical exam correlation to exclude incarceration. 7. Bilateral nephrolithiasis.   Bone Marrow, Aspirate,Biopsy, and Clot, right iliac done on 12/03/2017 showed:  - SLIGHTLY HYPERCELLULAR BONE MARROW WITH INVOLVEMENT BY NON-HODGKIN'S B-CELL LYMPHOMA. - A MINOR POPULATION OF MONOCLONAL PLASMA CELLS. - SEE COMMENT. PERIPHERAL BLOOD: - LYMPHOCYTOSIS. - THROMBOCYTOPENIA. Diagnosis Note The bone marrow is prominently involved by a B-cell lymphoproliferative process of primarily small lymphoid cells expressing pan B-cell antigens including CD20 associated with CD5 and cyclin D1 expression. The findings are most consistent with involvement by mantle cell lymphoma, but correlation with cytogenetic and FISH studies is recommended. There is also a minor population of monoclonal, kappa-restricted plasma cells representing 2% of cells in the aspirate associated with small clusters in the clot and biopsy sections that  may not be necessarily part of the lymphoid neoplastic process and possibly represents a separate disease (plasma cell neoplasm/MGUS). Correlation with serum protein electrophoresis and immunofixation electrophoresis is recommended. (BNS:ecj:ah 12/04/26/201  Bone  marrow FISH panel for CLL and mantle cell lymphoma reported from [facility]ogenetic lab at [facility] dated 12/03/2017 report shows absence of   t( 11: 14) translocation    Current Status:  Pt is seen today for follow-up.  He denies any fevers, chills, night sweats, adenopathy, abdominal pain.  Problem List Patient Active Problem List   Diagnosis Date Noted  . Lymphoproliferative disease ([place]) [D47.9] 11/16/2017  . Spina bifida ([place]) [Q05.9] 05/02/2015  . Annual physical exam [H73.42] 05/02/2015    Past Medical History Past Medical History:  Diagnosis Date  . Cancer ([place]) 08/2015   skin, nose  . Leukemia ([place])   . Spinal bifida, closed     Past Surgical History Past Surgical History:  Procedure Laterality Date  . FRACTURE SURGERY     left arm   . INGUINAL HERNIA REPAIR Right     Family History Family History  Problem Relation Age of Onset  . Hypertension Mother   . Deep vein thrombosis Mother   . Hyperlipidemia Mother   . Cancer Mother        skin  . Heart attack Father   . Diabetes Brother   . Hypertension Brother   . Hyperlipidemia Brother   . Heart attack Brother   . Arthritis Brother        back pain  . Arthritis Brother        back pain  . Cancer Maternal Grandmother        varian and uterus cancer  . Heart attack Maternal Grandmother   . Alcohol abuse Maternal Grandfather   . Tuberculosis Maternal Grandfather   . Heart disease Paternal Grandmother   . Pneumonia Paternal Grandfather      Social History  reports that he has never smoked. He has never used smokeless tobacco. He reports that he does not drink alcohol or use drugs.  Medications No current outpatient medications on file.  Allergies Patient has no known allergies.  Review of Systems Review of Systems - Oncology ROS as per HPI otherwise 12 point ROS is negative.   Physical Exam  Vitals Wt Readings from Last 3 Encounters:  09/13/18 164 lb 1.6 oz (74.4 kg)  06/03/18 158 lb 6.4 oz (71.8  kg)  03/31/18 162 lb (73.5 kg)   Temp Readings from Last 3 Encounters:  09/13/18 97.6 F (36.4 C) (Oral)  06/03/18 98 F (36.7 C) (Oral)  03/24/18 97.6 F (36.4 C) (Oral)   BP Readings from Last 3 Encounters:  09/13/18 107/63  06/03/18 113/66  03/31/18 109/61   Pulse Readings from Last 3 Encounters:  09/13/18 (!) 51  06/03/18 65  03/31/18 63   Constitutional: Well-developed, well-nourished, and in no distress.   HENT: Head: Normocephalic and atraumatic.  Mouth/Throat: No oropharyngeal exudate. Mucosa moist. Eyes: Pupils are equal, round, and reactive to light. Conjunctivae are normal. No scleral icterus.  Neck: Normal range of motion. Neck supple. No JVD present.  Cardiovascular: Normal rate, regular rhythm and normal heart sounds.  Exam reveals no gallop and no friction rub.   No murmur heard. Pulmonary/Chest: Effort normal and breath sounds normal. No respiratory distress. No wheezes.No rales.  Abdominal: Soft. Bowel sounds are normal. No distension. There is no tenderness. There is no guarding.  Musculoskeletal: No edema or tenderness.  Lymphadenopathy: No  cervical, axillary or supraclavicular adenopathy.  Neurological: Alert and oriented to person, place, and time. No cranial nerve deficit.  Skin: Skin is warm and dry. No rash noted. No erythema. No pallor.  Psychiatric: Affect and judgment normal.   Labs No visits with results within 3 Day(s) from this visit.  Latest known visit with results is:  Appointment on 09/06/2018  Component Date Value Ref Range Status  . WBC 09/06/2018 17.8* 4.0 - 10.5 K/uL Final  . RBC 09/06/2018 4.65  4.22 - 5.81 MIL/uL Final  . Hemoglobin 09/06/2018 12.6* 13.0 - 17.0 g/dL Final  . HCT 09/06/2018 40.8  39.0 - 52.0 % Final  . MCV 09/06/2018 87.7  80.0 - 100.0 fL Final  . MCH 09/06/2018 27.1  26.0 - 34.0 pg Final  . MCHC 09/06/2018 30.9  30.0 - 36.0 g/dL Final  . RDW 09/06/2018 14.7  11.5 - 15.5 % Final  . Platelets 09/06/2018 106* 150 -  400 K/uL Final   Comment: PLATELET COUNT CONFIRMED BY SMEAR SPECIMEN CHECKED FOR CLOTS Immature Platelet Fraction may be clinically indicated, consider ordering this additional test [id number]   . nRBC 09/06/2018 0.0  0.0 - 0.2 % Final  . Neutrophils Relative % 09/06/2018 15  % Final  . Neutro Abs 09/06/2018 2.7  1.7 - 7.7 K/uL Final  . Lymphocytes Relative 09/06/2018 79  % Final  . Lymphs Abs 09/06/2018 14.1* 0.7 - 4.0 K/uL Final  . Monocytes Relative 09/06/2018 4  % Final  . Monocytes Absolute 09/06/2018 0.6  0.1 - 1.0 K/uL Final  . Eosinophils Relative 09/06/2018 1  % Final  . Eosinophils Absolute 09/06/2018 0.2  0.0 - 0.5 K/uL Final  . Basophils Relative 09/06/2018 1  % Final  . Basophils Absolute 09/06/2018 0.1  0.0 - 0.1 K/uL Final  . WBC Morphology 09/06/2018 ABSOLUTE LYMPHOCYTOSIS   Final  . Immature Granulocytes 09/06/2018 0  % Final  . Abs Immature Granulocytes 09/06/2018 0.03  0.00 - 0.07 K/uL Final  . Reactive, Benign Lymphocytes 09/06/2018 PRESENT   Final   Performed at [facility], [address], [place], [place] [address]  . Sodium 09/06/2018 139  135 - 145 mmol/L Final  . Potassium 09/06/2018 4.1  3.5 - 5.1 mmol/L Final  . Chloride 09/06/2018 107  98 - 111 mmol/L Final  . CO2 09/06/2018 26  22 - 32 mmol/L Final  . Glucose, Bld 09/06/2018 89  70 - 99 mg/dL Final  . BUN 09/06/2018 25* 6 - 20 mg/dL Final  . Creatinine, Ser 09/06/2018 1.03  0.61 - 1.24 mg/dL Final  . Calcium 09/06/2018 8.9  8.9 - 10.3 mg/dL Final  . Total Protein 09/06/2018 6.9  6.5 - 8.1 g/dL Final  . Albumin 09/06/2018 4.0  3.5 - 5.0 g/dL Final  . AST 09/06/2018 17  15 - 41 U/L Final  . ALT 09/06/2018 12  0 - 44 U/L Final  . Alkaline Phosphatase 09/06/2018 41  38 - 126 U/L Final  . Total Bilirubin 09/06/2018 1.0  0.3 - 1.2 mg/dL Final  . GFR calc non Af Amer 09/06/2018 >60  >60 mL/min Final  . GFR calc Af Amer 09/06/2018 >60  >60 mL/min Final   Comment: (NOTE) The eGFR has been calculated using  the CKD EPI equation. This calculation has not been validated in all clinical situations. eGFR's persistently <60 mL/min signify possible Chronic Kidney Disease.   Georgiann Hahn gap 09/06/2018 6  5 - 15 Final   Performed at [facility],  [address] [place], [place] [address]  . LDH 09/06/2018 108  98 - 192 U/L Final   Performed at [facility], [address], [place], [place] [address]  . Beta-2 Microglobulin 09/06/2018 2.2  0.6 - 2.4 mg/L Final   Comment: (NOTE) Siemens Immulite 2000 Immunochemiluminometric assay (ICMA) Values obtained with different assay methods or kits cannot be used interchangeably. Results cannot be interpreted as absolute evidence of the presence or absence of malignant disease. Performed At: [facility] [place], [place] [id number] Rush Farmer MD [id number]   . Total Protein ELP 09/06/2018 6.2  6.0 - 8.5 g/dL Final  . Albumin ELP 09/06/2018 3.7  2.9 - 4.4 g/dL Final  . Alpha-1-Globulin 09/06/2018 0.2  0.0 - 0.4 g/dL Final  . Alpha-2-Globulin 09/06/2018 0.5  0.4 - 1.0 g/dL Final  . Beta Globulin 09/06/2018 1.5* 0.7 - 1.3 g/dL Final  . Gamma Globulin 09/06/2018 0.3* 0.4 - 1.8 g/dL Final  . M-Spike, % 09/06/2018 0.7* Not Observed g/dL Final  . SPE Interp. 09/06/2018 Comment   Final   Comment: (NOTE) The SPE pattern demonstrates a single peak (M-spike) in the beta region which may represent monoclonal protein. This peak may also be caused by the presence of fibrinogen or circulating immune complexes. If clinically indicated, the presence of a monoclonal gammopathy may be confirmed by immunofixation, as well as evaluation of the urine for the presence of Bence-Jones protein. Performed At: [facility] [place], [place] [id number] Rush Farmer MD [id number]   . Comment 09/06/2018 Comment   Final   Comment: (NOTE) Protein electrophoresis scan will follow via computer, mail, or courier delivery.   Marland Kitchen  GLOBULIN, TOTAL 09/06/2018 2.5  2.2 - 3.9 g/dL Corrected  . A/G Ratio 09/06/2018 1.5  0.7 - 1.7 Corrected  . Kappa free light chain 09/06/2018 96.1* 3.3 - 19.4 mg/L Final  . Lamda free light chains 09/06/2018 8.0  5.7 - 26.3 mg/L Final  . Kappa, lamda light chain ratio 09/06/2018 12.01* 0.26 - 1.65 Final   Comment: (NOTE) Performed At: [facility] [place], [place] [id number] Rush Farmer MD [id number]   . IgG (Immunoglobin G), Serum 09/06/2018 1,146  700 - 1,600 mg/dL Final  . IgA 09/06/2018 32* 90 - 386 mg/dL Final   Result confirmed on concentration.  . IgM (Immunoglobulin M), Srm 09/06/2018 20  20 - 172 mg/dL Final   Comment: (NOTE) Result confirmed on concentration. Performed At: [facility] [place], [place] [id number] Rush Farmer MD [id number]      Pathology Orders Placed This Encounter  Procedures  . DG Bone Survey Met    Standing Status:   Future    Number of Occurrences:   1    Standing Expiration Date:   11/14/2019    Order Specific Question:   Reason for Exam (SYMPTOM  OR DIAGNOSIS REQUIRED)    Answer:   monoclonal gammopathy    Order Specific Question:   Preferred imaging location?    Answer:   [facility]    Order Specific Question:   Radiology Contrast Protocol - do NOT remove file path    Answer:   \\[email or web address]  . NM PET Image Restag (PS) Skull Base To Thigh    Standing Status:   Future    Standing Expiration Date:   09/12/2020    Order Specific Question:   If indicated for the ordered procedure, I authorize the administration of a radiopharmaceutical per Radiology  protocol    Answer:   Yes    Order Specific Question:   Preferred imaging location?    Answer:   [facility]    Order Specific Question:   Radiology Contrast Protocol - do NOT remove file path    Answer:   \\[email or web address]  . CBC with  Differential/Platelet    Standing Status:   Future    Standing Expiration Date:   09/13/2020  . Comprehensive metabolic panel    Standing Status:   Future    Standing Expiration Date:   09/13/2020  . Lactate dehydrogenase    Standing Status:   Future    Standing Expiration Date:   09/13/2020  . Protein electrophoresis, serum    Standing Status:   Future    Standing Expiration Date:   09/13/2020  . IgG, IgA, IgM    Standing Status:   Future    Standing Expiration Date:   09/13/2020  . Kappa/lambda light chains    Standing Status:   Future    Standing Expiration Date:   09/13/2020       Zoila Shutter MD

## 2018-09-14 ENCOUNTER — Telehealth (HOSPITAL_COMMUNITY): Payer: Self-pay

## 2018-09-14 NOTE — Telephone Encounter (Signed)
Spoke with the mother and gave message "no lytic lesions" per Dr. Walden Field with understanding verbalized by the family.  Instructed to call back for any questions or concerns.

## 2018-11-15 ENCOUNTER — Encounter (HOSPITAL_COMMUNITY)
Admission: RE | Admit: 2018-11-15 | Discharge: 2018-11-15 | Disposition: A | Discharge status: Home / Self Care | Payer: Medicare Other | Source: Ambulatory Visit | Attending: Internal Medicine | Admitting: Internal Medicine

## 2018-11-15 ENCOUNTER — Other Ambulatory Visit (HOSPITAL_COMMUNITY): Payer: Medicare Other

## 2018-11-15 ENCOUNTER — Other Ambulatory Visit (HOSPITAL_COMMUNITY)
Admission: RE | Admit: 2018-11-15 | Discharge: 2018-11-15 | Disposition: A | Discharge status: Home / Self Care | Payer: Medicare Other | Source: Ambulatory Visit

## 2018-11-15 ENCOUNTER — Other Ambulatory Visit (HOSPITAL_COMMUNITY)
Admission: RE | Admit: 2018-11-15 | Discharge: 2018-11-15 | Disposition: A | Discharge status: Home / Self Care | Payer: Medicare Other | Source: Ambulatory Visit | Attending: Internal Medicine | Admitting: Internal Medicine

## 2018-11-15 ENCOUNTER — Other Ambulatory Visit (HOSPITAL_COMMUNITY): Payer: Self-pay | Admitting: *Deleted

## 2018-11-15 DIAGNOSIS — D479 Neoplasm of uncertain behavior of lymphoid, hematopoietic and related tissue, unspecified: Secondary | ICD-10-CM | POA: Diagnosis not present

## 2018-11-15 DIAGNOSIS — C8590 Non-Hodgkin lymphoma, unspecified, unspecified site: Secondary | ICD-10-CM | POA: Diagnosis not present

## 2018-11-15 LAB — LACTATE DEHYDROGENASE: LDH: 129 U/L (ref 98–192)

## 2018-11-15 LAB — CBC WITH DIFFERENTIAL/PLATELET
Abs Immature Granulocytes: 0.04 10*3/uL (ref 0.00–0.07)
Basophils Absolute: 0.1 10*3/uL (ref 0.0–0.1)
Basophils Relative: 1 %
Eosinophils Absolute: 0.2 10*3/uL (ref 0.0–0.5)
Eosinophils Relative: 1 %
HEMATOCRIT: 41.7 % (ref 39.0–52.0)
Hemoglobin: 13 g/dL (ref 13.0–17.0)
Immature Granulocytes: 0 %
Lymphocytes Relative: 80 %
Lymphs Abs: 19.9 10*3/uL — ABNORMAL HIGH (ref 0.7–4.0)
MCH: 27.1 pg (ref 26.0–34.0)
MCHC: 31.2 g/dL (ref 30.0–36.0)
MCV: 86.9 fL (ref 80.0–100.0)
MONO ABS: 0.5 10*3/uL (ref 0.1–1.0)
Monocytes Relative: 2 %
Neutro Abs: 3.9 10*3/uL (ref 1.7–7.7)
Neutrophils Relative %: 16 %
Platelets: 121 10*3/uL — ABNORMAL LOW (ref 150–400)
RBC: 4.8 MIL/uL (ref 4.22–5.81)
RDW: 14.3 % (ref 11.5–15.5)
WBC: 24.6 10*3/uL — ABNORMAL HIGH (ref 4.0–10.5)
nRBC: 0 % (ref 0.0–0.2)

## 2018-11-15 LAB — COMPREHENSIVE METABOLIC PANEL
ALT: 15 U/L (ref 0–44)
AST: 23 U/L (ref 15–41)
Albumin: 4.2 g/dL (ref 3.5–5.0)
Alkaline Phosphatase: 46 U/L (ref 38–126)
Anion gap: 7 (ref 5–15)
BUN: 17 mg/dL (ref 6–20)
CO2: 22 mmol/L (ref 22–32)
Calcium: 9.1 mg/dL (ref 8.9–10.3)
Chloride: 108 mmol/L (ref 98–111)
Creatinine, Ser: 0.91 mg/dL (ref 0.61–1.24)
GFR calc Af Amer: >60 mL/min (ref >60)
GFR calc non Af Amer: >60 mL/min (ref >60)
Glucose, Bld: 96 mg/dL (ref 70–99)
Potassium: 4.3 mmol/L (ref 3.5–5.1)
SODIUM: 137 mmol/L (ref 135–145)
Total Bilirubin: 0.9 mg/dL (ref 0.3–1.2)
Total Protein: 7.1 g/dL (ref 6.5–8.1)

## 2018-11-15 MED ORDER — FLUDEOXYGLUCOSE F - 18 (FDG) INJECTION
8.9900 | Freq: Once | INTRAVENOUS | Status: AC | PRN
  Administered 2018-11-15: 8.99 via INTRAVENOUS

## 2018-11-16 LAB — PROTEIN ELECTROPHORESIS, SERUM
A/G Ratio: 1.4 (ref 0.7–1.7)
Albumin ELP: 4 g/dL (ref 2.9–4.4)
Alpha-1-Globulin: 0.2 g/dL (ref 0.0–0.4)
Alpha-2-Globulin: 0.7 g/dL (ref 0.4–1.0)
Beta Globulin: 1.6 g/dL — ABNORMAL HIGH (ref 0.7–1.3)
Gamma Globulin: 0.3 g/dL — ABNORMAL LOW (ref 0.4–1.8)
Globulin, Total: 2.9 g/dL (ref 2.2–3.9)
M-Spike, %: 0.9 g/dL — ABNORMAL HIGH
Total Protein ELP: 6.9 g/dL (ref 6.0–8.5)

## 2018-11-16 LAB — IGG, IGA, IGM
IgA: 36 mg/dL — ABNORMAL LOW (ref 90–386)
IgG (Immunoglobin G), Serum: 1199 mg/dL (ref 700–1600)
IgM (Immunoglobulin M), Srm: 19 mg/dL — ABNORMAL LOW (ref 20–172)

## 2018-11-16 LAB — KAPPA/LAMBDA LIGHT CHAINS
KAPPA FREE LGHT CHN: 101.5 mg/L — AB (ref 3.3–19.4)
KAPPA, LAMDA LIGHT CHAIN RATIO: 15.86 — AB (ref 0.26–1.65)
LAMDA FREE LIGHT CHAINS: 6.4 mg/L (ref 5.7–26.3)

## 2018-11-22 ENCOUNTER — Ambulatory Visit (HOSPITAL_COMMUNITY): Payer: Medicare Other | Admitting: Hematology

## 2018-11-29 ENCOUNTER — Other Ambulatory Visit (HOSPITAL_COMMUNITY): Payer: Medicare Other

## 2018-11-29 ENCOUNTER — Encounter (HOSPITAL_COMMUNITY): Payer: Self-pay | Admitting: Hematology

## 2018-11-29 ENCOUNTER — Other Ambulatory Visit: Payer: Self-pay

## 2018-11-29 ENCOUNTER — Inpatient Hospital Stay (HOSPITAL_COMMUNITY): Payer: Medicare Other | Attending: Internal Medicine | Admitting: Hematology

## 2018-11-29 DIAGNOSIS — C8310 Mantle cell lymphoma, unspecified site: Secondary | ICD-10-CM | POA: Diagnosis not present

## 2018-11-29 DIAGNOSIS — D479 Neoplasm of uncertain behavior of lymphoid, hematopoietic and related tissue, unspecified: Secondary | ICD-10-CM

## 2018-11-29 NOTE — Progress Notes (Signed)
Lance Erickson, Lance Erickson [id number]   CLINIC:  Medical Oncology/Hematology  PCP:  [name], [place] [address] [phone]   REASON FOR VISIT: Follow-up for Indolent mantle cell lymphoma  CURRENT THERAPY: observation    INTERVAL HISTORY:  Lance Erickson 58 y.o. male returns for routine follow-up for Indolent mantle cell lymphoma. He is here today with his mother. He is doing well with no complaints at this time. He does have enlarged lymph nodes bilateral axillary. They are not painful. Denies any nausea, vomiting, or diarrhea. Denies any new pains. Had not noticed any recent bleeding such as epistaxis, hematuria or hematochezia. Denies recent chest pain on exertion, shortness of breath on minimal exertion, pre-syncopal episodes, or palpitations. Denies any numbness or tingling in hands or feet. Denies any recent fevers, infections, or recent hospitalizations. Patient reports appetite at 100% and energy level at 100%. He is having no problems maintaining his weight at this time.    REVIEW OF SYSTEMS:  Review of Systems  All other systems reviewed and are negative.    PAST MEDICAL/SURGICAL HISTORY:  Past Medical History:  Diagnosis Date  . Cancer ([place]) 08/2015   skin, nose  . Leukemia ([place])   . Spinal bifida, closed    Past Surgical History:  Procedure Laterality Date  . FRACTURE SURGERY     left arm   . INGUINAL HERNIA REPAIR Right      SOCIAL HISTORY:  Social History   Socioeconomic History  . Marital status: Single    Spouse name: Not on file  . Number of children: Not on file  . Years of education: Not on file  . Highest education level: Not on file  Occupational History  . Occupation: disabled  Social Needs  . Financial resource strain: Not hard at all  . Food insecurity:    Worry: Never true    Inability: Never true  . Transportation needs:    Medical: No    Non-medical: No  Tobacco Use  .  Smoking status: Never Smoker  . Smokeless tobacco: Never Used  Substance and Sexual Activity  . Alcohol use: No  . Drug use: No  . Sexual activity: Not Currently  Lifestyle  . Physical activity:    Days per week: Not on file    Minutes per session: Not on file  . Stress: Not at all  Relationships  . Social connections:    Talks on phone: More than three times a week    Gets together: More than three times a week    Attends religious service: More than 4 times per year    Active member of club or organization: Yes    Attends meetings of clubs or organizations: More than 4 times per year    Relationship status: Married  . Intimate partner violence:    Fear of current or ex partner: Not on file    Emotionally abused: Not on file    Physically abused: Not on file    Forced sexual activity: Not on file  Other Topics Concern  . Not on file  Social History Narrative  . Not on file    FAMILY HISTORY:  Family History  Problem Relation Age of Onset  . Hypertension Mother   . Deep vein thrombosis Mother   . Hyperlipidemia Mother   . Cancer Mother        skin  . Heart attack Father   . Diabetes  Brother   . Hypertension Brother   . Hyperlipidemia Brother   . Heart attack Brother   . Arthritis Brother        back pain  . Arthritis Brother        back pain  . Cancer Maternal Grandmother        varian and uterus cancer  . Heart attack Maternal Grandmother   . Alcohol abuse Maternal Grandfather   . Tuberculosis Maternal Grandfather   . Heart disease Paternal Grandmother   . Pneumonia Paternal Grandfather     CURRENT MEDICATIONS:  No outpatient encounter medications on file as of 11/29/2018.   No facility-administered encounter medications on file as of 11/29/2018.     ALLERGIES:  No Known Allergies   PHYSICAL EXAM:  ECOG Performance status: 1  Vitals:   11/29/18 1300  BP: 136/65  Pulse: 62  Resp: 16  Temp: 97.7 F (36.5 C)  SpO2: 100%   Filed Weights    11/29/18 1300  Weight: 167 lb 6 oz (75.9 kg)    Physical Exam Constitutional:      Appearance: Normal appearance. He is normal weight.  Cardiovascular:     Rate and Rhythm: Normal rate and regular rhythm.     Heart sounds: Normal heart sounds.  Pulmonary:     Effort: Pulmonary effort is normal.     Breath sounds: Normal breath sounds.  Abdominal:     General: Abdomen is flat.     Palpations: Abdomen is soft.  Musculoskeletal: Normal range of motion.  Skin:    General: Skin is warm and dry.  Neurological:     Mental Status: He is alert and oriented to person, place, and time. Mental status is at baseline.  Psychiatric:        Mood and Affect: Mood normal.        Behavior: Behavior normal.        Thought Content: Thought content normal.        Judgment: Judgment normal.    Bilateral axillary adenopathy palpable.  Right inguinal adenopathy palpable.  LABORATORY DATA:  I have reviewed the labs as listed.  CBC    Component Value Date/Time   WBC 24.6 (H) 11/15/2018 1620   RBC 4.80 11/15/2018 1620   HGB 13.0 11/15/2018 1620   HGB 12.9 (L) 10/08/2017 0950   HCT 41.7 11/15/2018 1620   HCT 38.5 10/08/2017 0950   PLT 121 (L) 11/15/2018 1620   PLT 138 (L) 10/08/2017 0950   MCV 86.9 11/15/2018 1620   MCV 85 10/08/2017 0950   MCH 27.1 11/15/2018 1620   MCHC 31.2 11/15/2018 1620   RDW 14.3 11/15/2018 1620   RDW 14.7 10/08/2017 0950   LYMPHSABS 19.9 (H) 11/15/2018 1620   LYMPHSABS 11.2 (H) 10/08/2017 0950   MONOABS 0.5 11/15/2018 1620   EOSABS 0.2 11/15/2018 1620   EOSABS 0.1 10/08/2017 0950   BASOSABS 0.1 11/15/2018 1620   BASOSABS 0.1 10/08/2017 0950   CMP Latest Ref Rng & Units 11/15/2018 09/06/2018 05/27/2018  Glucose 70 - 99 mg/dL 96 89 103(H)  BUN 6 - 20 mg/dL 17 25(H) 28(H)  Creatinine 0.61 - 1.24 mg/dL 0.91 1.03 0.97  Sodium 135 - 145 mmol/L 137 139 141  Potassium 3.5 - 5.1 mmol/L 4.3 4.1 4.8  Chloride 98 - 111 mmol/L 108 107 108  CO2 22 - 32 mmol/L _0 Calcium 8.9 - 10.3 mg/dL 9.1 8.9 9.5  Total Protein 6.5 - 8.1 g/dL 7.1 6.9  6.7  Total Bilirubin 0.3 - 1.2 mg/dL 0.9 1.0 0.5  Alkaline Phos 38 - 126 U/L 46 41 46  AST 15 - 41 U/L _0 ALT 0 - 44 U/L _1 DIAGNOSTIC IMAGING:  I have independently reviewed the scans and discussed with the patient.   I have reviewed Francene Finders, NP's note and agree with the documentation.  I personally performed a face-to-face visit, made revisions and my assessment and plan is as follows.    ASSESSMENT & PLAN:   Lymphoproliferative disease ([place]) 1.  Indolent mantle cell lymphoma: - He was evaluated for leukocytosis and lymphocytosis. - Bone marrow biopsy on 12/03/2017 showed slightly hypercellular marrow with involvement by NHL.  Bone marrow is predominantly involved by small lymphoid cells expressing pan-B cell antigens including CD20, CD5 and cyclin D1 expression.  Chromosome analysis shows 80, XY.  CLL FISH panel was negative.  There is also minor population of monoclonal, kappa restricted plasma cells representing 2% of cells. -PET/CT scan on 11/16/2018 shows no appreciable change in low-level metabolism (maximum SUV 2.3) associated with mild bilateral axillary and right inguinal adenopathy.  Mild splenomegaly measuring 13.6 cm, mildly decreased from prior.  No splenic hypermetabolism.  No new or progressive sites of lymphoma. -I reviewed his blood work today.  White count was 24.6, hemoglobin 13 and platelet count 121.  Increase in absolute lymphocyte count. -SPEP showed 0.9 g/dL of M spike.  Elevated kappa light chains of 101.5 with light chain ratio 15.86.  LDH was normal at 129. - Skeletal survey was done did not show any lytic lesions. -I think the M spike is associated with indolent lymphoma. -Today's physical examination revealed bilateral axillary adenopathy, right bigger than left and right inguinal lymph node palpable.  No palpable splenomegaly. - We will see him back in 4  months with repeat physical exam and blood work. -We will do scans only if clinical condition dictates. - Indolent mantle cell lymphoma associated with elevated white count usually has a very indolent course.      Orders placed this encounter:  Orders Placed This Encounter  Procedures  . Lactate dehydrogenase  . Protein electrophoresis, serum  . Kappa/lambda light chains  . CBC with Differential/Platelet  . Comprehensive metabolic panel  . IgG, IgA, IgM      Derek Jack, MD [place] [phone]

## 2018-11-29 NOTE — Assessment & Plan Note (Addendum)
1.  Indolent mantle cell lymphoma: - He was evaluated for leukocytosis and lymphocytosis. - Bone marrow biopsy on 12/03/2017 showed slightly hypercellular marrow with involvement by NHL.  Bone marrow is predominantly involved by small lymphoid cells expressing pan-B cell antigens including CD20, CD5 and cyclin D1 expression.  Chromosome analysis shows 28, XY.  CLL FISH panel was negative.  There is also minor population of monoclonal, kappa restricted plasma cells representing 2% of cells. -PET/CT scan on 11/16/2018 shows no appreciable change in low-level metabolism (maximum SUV 2.3) associated with mild bilateral axillary and right inguinal adenopathy.  Mild splenomegaly measuring 13.6 cm, mildly decreased from prior.  No splenic hypermetabolism.  No new or progressive sites of lymphoma. -I reviewed his blood work today.  White count was 24.6, hemoglobin 13 and platelet count 121.  Increase in absolute lymphocyte count. -SPEP showed 0.9 g/dL of M spike.  Elevated kappa light chains of 101.5 with light chain ratio 15.86.  LDH was normal at 129. - Skeletal survey was done did not show any lytic lesions. -I think the M spike is associated with indolent lymphoma. -Today's physical examination revealed bilateral axillary adenopathy, right bigger than left and right inguinal lymph node palpable.  No palpable splenomegaly. - We will see him back in 4 months with repeat physical exam and blood work. -We will do scans only if clinical condition dictates. - Indolent mantle cell lymphoma associated with elevated white count usually has a very indolent course.

## 2018-11-29 NOTE — Patient Instructions (Signed)
[facility] at [facility] Discharge Instructions Follow up in 4 months with labs    Thank you for choosing [facility] at [facility] to provide your oncology and hematology care.  To afford each patient quality time with our provider, please arrive at least 15 minutes before your scheduled appointment time.   If you have a lab appointment with the Cancer Center please come in thru the  Main Entrance and check in at the main information desk  You need to re-schedule your appointment should you arrive 10 or more minutes late.  We strive to give you quality time with our providers, and arriving late affects you and other patients whose appointments are after yours.  Also, if you no show three or more times for appointments you may be dismissed from the clinic at the providers discretion.     Again, thank you for choosing [facility].  Our hope is that these requests will decrease the amount of time that you wait before being seen by our physicians.       _____________________________________________________________  Should you have questions after your visit to [facility], please contact our office at [phone] between the hours of 8:00 a.m. and 4:30 p.m.  Voicemails left after 4:00 p.m. will not be returned until the following business day.  For prescription refill requests, have your pharmacy contact our office and allow 72 hours.    Cancer Center Support Programs:   > Cancer Support Group  2nd Tuesday of the month 1pm-2pm, Journey Room    

## 2018-11-30 ENCOUNTER — Ambulatory Visit (HOSPITAL_COMMUNITY): Payer: Medicare Other | Admitting: Hematology

## 2019-02-18 ENCOUNTER — Ambulatory Visit (HOSPITAL_COMMUNITY): Payer: Medicare Other | Admitting: Hematology

## 2019-03-21 ENCOUNTER — Telehealth: Payer: Self-pay | Admitting: Family Medicine

## 2019-03-25 ENCOUNTER — Other Ambulatory Visit: Payer: Self-pay

## 2019-03-25 ENCOUNTER — Inpatient Hospital Stay (HOSPITAL_COMMUNITY): Payer: Medicare Other | Attending: Hematology

## 2019-03-25 DIAGNOSIS — C8310 Mantle cell lymphoma, unspecified site: Secondary | ICD-10-CM | POA: Insufficient documentation

## 2019-03-25 DIAGNOSIS — D479 Neoplasm of uncertain behavior of lymphoid, hematopoietic and related tissue, unspecified: Secondary | ICD-10-CM

## 2019-03-25 DIAGNOSIS — R590 Localized enlarged lymph nodes: Secondary | ICD-10-CM | POA: Insufficient documentation

## 2019-03-25 LAB — CBC WITH DIFFERENTIAL/PLATELET
Abs Immature Granulocytes: 0.03 10*3/uL (ref 0.00–0.07)
Basophils Absolute: 0.1 10*3/uL (ref 0.0–0.1)
Basophils Relative: 0 %
Eosinophils Absolute: 0.2 10*3/uL (ref 0.0–0.5)
Eosinophils Relative: 1 %
HCT: 39.7 % (ref 39.0–52.0)
Hemoglobin: 12.3 g/dL — ABNORMAL LOW (ref 13.0–17.0)
Immature Granulocytes: 0 %
Lymphocytes Relative: 81 %
Lymphs Abs: 18.2 10*3/uL — ABNORMAL HIGH (ref 0.7–4.0)
MCH: 27.4 pg (ref 26.0–34.0)
MCHC: 31 g/dL (ref 30.0–36.0)
MCV: 88.4 fL (ref 80.0–100.0)
Monocytes Absolute: 0.7 10*3/uL (ref 0.1–1.0)
Monocytes Relative: 3 %
Neutro Abs: 3.3 10*3/uL (ref 1.7–7.7)
Neutrophils Relative %: 15 %
Platelets: 120 10*3/uL — ABNORMAL LOW (ref 150–400)
RBC: 4.49 MIL/uL (ref 4.22–5.81)
RDW: 14.5 % (ref 11.5–15.5)
WBC: 22.5 10*3/uL — ABNORMAL HIGH (ref 4.0–10.5)
nRBC: 0 % (ref 0.0–0.2)

## 2019-03-25 LAB — COMPREHENSIVE METABOLIC PANEL
ALT: 12 U/L (ref 0–44)
AST: 19 U/L (ref 15–41)
Albumin: 4.1 g/dL (ref 3.5–5.0)
Alkaline Phosphatase: 49 U/L (ref 38–126)
Anion gap: 8 (ref 5–15)
BUN: 25 mg/dL — ABNORMAL HIGH (ref 6–20)
CO2: 26 mmol/L (ref 22–32)
Calcium: 9.3 mg/dL (ref 8.9–10.3)
Chloride: 104 mmol/L (ref 98–111)
Creatinine, Ser: 0.95 mg/dL (ref 0.61–1.24)
GFR calc Af Amer: >60 mL/min (ref >60)
GFR calc non Af Amer: >60 mL/min (ref >60)
Glucose, Bld: 93 mg/dL (ref 70–99)
Potassium: 5 mmol/L (ref 3.5–5.1)
Sodium: 138 mmol/L (ref 135–145)
Total Bilirubin: 0.7 mg/dL (ref 0.3–1.2)
Total Protein: 7.3 g/dL (ref 6.5–8.1)

## 2019-03-25 LAB — LACTATE DEHYDROGENASE: LDH: 107 U/L (ref 98–192)

## 2019-03-26 LAB — IGG, IGA, IGM
IgA: 44 mg/dL — ABNORMAL LOW (ref 90–386)
IgG (Immunoglobin G), Serum: 1287 mg/dL (ref 603–1613)
IgM (Immunoglobulin M), Srm: 15 mg/dL — ABNORMAL LOW (ref 20–172)

## 2019-03-28 LAB — PROTEIN ELECTROPHORESIS, SERUM
A/G Ratio: 1.4 (ref 0.7–1.7)
Albumin ELP: 4.1 g/dL (ref 2.9–4.4)
Alpha-1-Globulin: 0.2 g/dL (ref 0.0–0.4)
Alpha-2-Globulin: 0.5 g/dL (ref 0.4–1.0)
Beta Globulin: 1.9 g/dL — ABNORMAL HIGH (ref 0.7–1.3)
Gamma Globulin: 0.3 g/dL — ABNORMAL LOW (ref 0.4–1.8)
Globulin, Total: 2.9 g/dL (ref 2.2–3.9)
M-Spike, %: 1.1 g/dL — ABNORMAL HIGH
Total Protein ELP: 7 g/dL (ref 6.0–8.5)

## 2019-03-28 LAB — KAPPA/LAMBDA LIGHT CHAINS
Kappa free light chain: 120.6 mg/L — ABNORMAL HIGH (ref 3.3–19.4)
Kappa, lambda light chain ratio: 20.44 — ABNORMAL HIGH (ref 0.26–1.65)
Lambda free light chains: 5.9 mg/L (ref 5.7–26.3)

## 2019-04-01 ENCOUNTER — Encounter (HOSPITAL_COMMUNITY): Payer: Self-pay | Admitting: Hematology

## 2019-04-01 ENCOUNTER — Other Ambulatory Visit: Payer: Self-pay

## 2019-04-01 ENCOUNTER — Inpatient Hospital Stay (HOSPITAL_COMMUNITY): Payer: Medicare Other | Admitting: Hematology

## 2019-04-01 VITALS — BP 136/65 | HR 79 | Temp 98.4°F | Resp 16 | Wt 162.9 lb

## 2019-04-01 DIAGNOSIS — C8310 Mantle cell lymphoma, unspecified site: Secondary | ICD-10-CM | POA: Diagnosis not present

## 2019-04-01 DIAGNOSIS — D479 Neoplasm of uncertain behavior of lymphoid, hematopoietic and related tissue, unspecified: Secondary | ICD-10-CM

## 2019-04-01 DIAGNOSIS — R590 Localized enlarged lymph nodes: Secondary | ICD-10-CM

## 2019-04-01 NOTE — Progress Notes (Signed)
Lance Erickson, Lance Erickson   CLINIC:  Medical Oncology/Hematology  PCP:  [name], MD [place] [address] [phone]   REASON FOR VISIT:  Follow-up for Indolent Mantle Cell Lymphoma  CURRENT THERAPY: Observation    INTERVAL HISTORY:  Lance Erickson 58 y.o. male presents today for follow up. He reports overall doing well. He denies any significant fatigue. Appetite is stable. Reports participating in adequate amount of physical activity daily. Denies any fevers, chills, night sweats. No weight loss. No lymphadenopathy. No recent infections. . No recent hospitalizations or ER visits.  Energy and appetite levels are 100 present.    REVIEW OF SYSTEMS:  Review of Systems  Constitutional: Negative.   HENT:  Negative.   Eyes: Negative.   Respiratory: Negative.   Cardiovascular: Negative.   Gastrointestinal: Negative.   Endocrine: Negative.   Genitourinary: Negative.    Musculoskeletal: Negative.   Skin: Negative.   Neurological: Negative.   Hematological: Negative.   Psychiatric/Behavioral: Negative.      PAST MEDICAL/SURGICAL HISTORY:  Past Medical History:  Diagnosis Date  . Cancer ([place]) 08/2015   skin, nose  . Leukemia ([place])   . Spinal bifida, closed    Past Surgical History:  Procedure Laterality Date  . FRACTURE SURGERY     left arm   . INGUINAL HERNIA REPAIR Right      SOCIAL HISTORY:  Social History   Socioeconomic History  . Marital status: Single    Spouse name: Not on file  . Number of children: Not on file  . Years of education: Not on file  . Highest education level: Not on file  Occupational History  . Occupation: disabled  Social Needs  . Financial resource strain: Not hard at all  . Food insecurity:    Worry: Never true    Inability: Never true  . Transportation needs:    Medical: No    Non-medical: No  Tobacco Use  . Smoking status: Never Smoker  . Smokeless tobacco: Never  Used  Substance and Sexual Activity  . Alcohol use: No  . Drug use: No  . Sexual activity: Not Currently  Lifestyle  . Physical activity:    Days per week: Not on file    Minutes per session: Not on file  . Stress: Not at all  Relationships  . Social connections:    Talks on phone: More than three times a week    Gets together: More than three times a week    Attends religious service: More than 4 times per year    Active member of club or organization: Yes    Attends meetings of clubs or organizations: More than 4 times per year    Relationship status: Married  . Intimate partner violence:    Fear of current or ex partner: Not on file    Emotionally abused: Not on file    Physically abused: Not on file    Forced sexual activity: Not on file  Other Topics Concern  . Not on file  Social History Narrative  . Not on file    FAMILY HISTORY:  Family History  Problem Relation Age of Onset  . Hypertension Mother   . Deep vein thrombosis Mother   . Hyperlipidemia Mother   . Cancer Mother        skin  . Heart attack Father   . Diabetes Brother   . Hypertension Brother   . Hyperlipidemia  Brother   . Heart attack Brother   . Arthritis Brother        back pain  . Arthritis Brother        back pain  . Cancer Maternal Grandmother        varian and uterus cancer  . Heart attack Maternal Grandmother   . Alcohol abuse Maternal Grandfather   . Tuberculosis Maternal Grandfather   . Heart disease Paternal Grandmother   . Pneumonia Paternal Grandfather     CURRENT MEDICATIONS:  No outpatient encounter medications on file as of 04/01/2019.   No facility-administered encounter medications on file as of 04/01/2019.     ALLERGIES:  No Known Allergies   PHYSICAL EXAM:  ECOG Performance status: 1  Vitals:   04/01/19 1053  BP: 136/65  Pulse: 79  Resp: 16  Temp: 98.4 F (36.9 C)  SpO2: 100%   Filed Weights   04/01/19 1053  Weight: 162 lb 14.4 oz (73.9 kg)     Physical Exam Vitals signs reviewed.  Constitutional:      Appearance: Normal appearance.  Cardiovascular:     Rate and Rhythm: Normal rate and regular rhythm.     Heart sounds: Normal heart sounds.  Pulmonary:     Effort: Pulmonary effort is normal.     Breath sounds: Normal breath sounds.  Abdominal:     General: There is no distension.     Palpations: Abdomen is soft. There is no mass.  Musculoskeletal:        General: No swelling.  Lymphadenopathy:     Cervical: Cervical adenopathy present.  Skin:    General: Skin is warm.  Neurological:     General: No focal deficit present.     Mental Status: He is alert and oriented to person, place, and time.  Psychiatric:        Mood and Affect: Mood normal.        Behavior: Behavior normal.    Axillary adenopathy present.  LABORATORY DATA:  I have reviewed the labs as listed.  CBC    Component Value Date/Time   WBC 22.5 (H) 03/25/2019 1214   RBC 4.49 03/25/2019 1214   HGB 12.3 (L) 03/25/2019 1214   HGB 12.9 (L) 10/08/2017 0950   HCT 39.7 03/25/2019 1214   HCT 38.5 10/08/2017 0950   PLT 120 (L) 03/25/2019 1214   PLT 138 (L) 10/08/2017 0950   MCV 88.4 03/25/2019 1214   MCV 85 10/08/2017 0950   MCH 27.4 03/25/2019 1214   MCHC 31.0 03/25/2019 1214   RDW 14.5 03/25/2019 1214   RDW 14.7 10/08/2017 0950   LYMPHSABS 18.2 (H) 03/25/2019 1214   LYMPHSABS 11.2 (H) 10/08/2017 0950   MONOABS 0.7 03/25/2019 1214   EOSABS 0.2 03/25/2019 1214   EOSABS 0.1 10/08/2017 0950   BASOSABS 0.1 03/25/2019 1214   BASOSABS 0.1 10/08/2017 0950   CMP Latest Ref Rng & Units 03/25/2019 11/15/2018 09/06/2018  Glucose 70 - 99 mg/dL 93 96 89  BUN 6 - 20 mg/dL 25(H) 17 25(H)  Creatinine 0.61 - 1.24 mg/dL 0.95 0.91 1.03  Sodium 135 - 145 mmol/L 138 137 139  Potassium 3.5 - 5.1 mmol/L 5.0 4.3 4.1  Chloride 98 - 111 mmol/L 104 108 107  CO2 22 - 32 mmol/L _0 Calcium 8.9 - 10.3 mg/dL 9.3 9.1 8.9  Total Protein 6.5 - 8.1 g/dL 7.3 7.1 6.9   Total Bilirubin 0.3 - 1.2 mg/dL 0.7 0.9 1.0  Alkaline Phos 38 -  126 U/L 49 46 41  AST 15 - 41 U/L _0 ALT 0 - 44 U/L _1 DIAGNOSTIC IMAGING:  I have independently reviewed the scans and discussed with the patient.   I have reviewed Carver Fila, NP's note and agree with the documentation.  I personally performed a face-to-face visit, made revisions and my assessment and plan is as follows.    ASSESSMENT & PLAN:   Lymphoproliferative disease ([place]) 1.  Indolent mantle cell lymphoma: - He was evaluated for leukocytosis and lymphocytosis. - Bone marrow biopsy on 12/03/2017 showed slightly hypercellular marrow with involvement by NHL.  Bone marrow is predominantly involved by small lymphoid cells expressing pan-B cell antigens including CD20, CD5 and cyclin D1 expression.  Chromosome analysis shows 73, XY.  CLL FISH panel was negative.  There is also minor population of monoclonal, kappa restricted plasma cells representing 2% of cells. -PET/CT scan on 11/16/2018 shows no appreciable change in low-level metabolism (maximum SUV 2.3) associated with mild bilateral axillary and right inguinal adenopathy.  Mild splenomegaly measuring 13.6 cm, mildly decreased from prior.  No splenic hypermetabolism.  No new or progressive sites of lymphoma. -I reviewed his blood work today.  White count was 24.6, hemoglobin 13 and platelet count 121.  Increase in absolute lymphocyte count. -SPEP showed 0.9 g/dL of M spike.  Elevated kappa light chains of 101.5 with light chain ratio 15.86.  LDH was normal at 129. - Skeletal survey was done did not show any lytic lesions. -I think the M spike is associated with indolent lymphoma. -Today's physical examination revealed bilateral axillary adenopathy, right bigger than left and right inguinal lymph node palpable.  No palpable splenomegaly. -Labs are relatively stable.  -We will do scans only if clinical condition dictates. - Indolent mantle cell  lymphoma associated with elevated white count usually has a very indolent course. - RTC in 4 mths.       Orders placed this encounter:  Orders Placed This Encounter  Procedures  . CBC with Differential  . Comprehensive metabolic panel  . Lactate dehydrogenase  . Multiple Myeloma Panel (SPEP&IFE w/QIG)  . Kappa/lambda light chains      Derek Jack, [place] [phone]

## 2019-04-01 NOTE — Assessment & Plan Note (Signed)
1.  Indolent mantle cell lymphoma: - He was evaluated for leukocytosis and lymphocytosis. - Bone marrow biopsy on 12/03/2017 showed slightly hypercellular marrow with involvement by NHL.  Bone marrow is predominantly involved by small lymphoid cells expressing pan-B cell antigens including CD20, CD5 and cyclin D1 expression.  Chromosome analysis shows 13, XY.  CLL FISH panel was negative.  There is also minor population of monoclonal, kappa restricted plasma cells representing 2% of cells. -PET/CT scan on 11/16/2018 shows no appreciable change in low-level metabolism (maximum SUV 2.3) associated with mild bilateral axillary and right inguinal adenopathy.  Mild splenomegaly measuring 13.6 cm, mildly decreased from prior.  No splenic hypermetabolism.  No new or progressive sites of lymphoma. -I reviewed his blood work today.  White count was 24.6, hemoglobin 13 and platelet count 121.  Increase in absolute lymphocyte count. -SPEP showed 0.9 g/dL of M spike.  Elevated kappa light chains of 101.5 with light chain ratio 15.86.  LDH was normal at 129. - Skeletal survey was done did not show any lytic lesions. -I think the M spike is associated with indolent lymphoma. -Today's physical examination revealed bilateral axillary adenopathy, right bigger than left and right inguinal lymph node palpable.  No palpable splenomegaly. -Labs are relatively stable.  -We will do scans only if clinical condition dictates. - Indolent mantle cell lymphoma associated with elevated white count usually has a very indolent course. - RTC in 4 mths.

## 2019-07-26 ENCOUNTER — Inpatient Hospital Stay (HOSPITAL_COMMUNITY): Payer: Medicare Other | Attending: Hematology

## 2019-07-26 ENCOUNTER — Other Ambulatory Visit: Payer: Self-pay

## 2019-07-26 DIAGNOSIS — D479 Neoplasm of uncertain behavior of lymphoid, hematopoietic and related tissue, unspecified: Secondary | ICD-10-CM

## 2019-07-26 DIAGNOSIS — C8310 Mantle cell lymphoma, unspecified site: Secondary | ICD-10-CM | POA: Diagnosis not present

## 2019-07-26 LAB — CBC WITH DIFFERENTIAL/PLATELET
Abs Immature Granulocytes: 0.05 10*3/uL (ref 0.00–0.07)
Basophils Absolute: 0.1 10*3/uL (ref 0.0–0.1)
Basophils Relative: 1 %
Eosinophils Absolute: 0.3 10*3/uL (ref 0.0–0.5)
Eosinophils Relative: 1 %
HCT: 39.6 % (ref 39.0–52.0)
Hemoglobin: 12.3 g/dL — ABNORMAL LOW (ref 13.0–17.0)
Immature Granulocytes: 0 %
Lymphocytes Relative: 84 %
Lymphs Abs: 24 10*3/uL — ABNORMAL HIGH (ref 0.7–4.0)
MCH: 27.6 pg (ref 26.0–34.0)
MCHC: 31.1 g/dL (ref 30.0–36.0)
MCV: 88.8 fL (ref 80.0–100.0)
Monocytes Absolute: 0.6 10*3/uL (ref 0.1–1.0)
Monocytes Relative: 2 %
Neutro Abs: 3.4 10*3/uL (ref 1.7–7.7)
Neutrophils Relative %: 12 %
Platelets: 126 10*3/uL — ABNORMAL LOW (ref 150–400)
RBC: 4.46 MIL/uL (ref 4.22–5.81)
RDW: 14.3 % (ref 11.5–15.5)
WBC: 28.5 10*3/uL — ABNORMAL HIGH (ref 4.0–10.5)
nRBC: 0 % (ref 0.0–0.2)

## 2019-07-26 LAB — COMPREHENSIVE METABOLIC PANEL
ALT: 14 U/L (ref 0–44)
AST: 20 U/L (ref 15–41)
Albumin: 4.1 g/dL (ref 3.5–5.0)
Alkaline Phosphatase: 48 U/L (ref 38–126)
Anion gap: 9 (ref 5–15)
BUN: 22 mg/dL — ABNORMAL HIGH (ref 6–20)
CO2: 24 mmol/L (ref 22–32)
Calcium: 9.2 mg/dL (ref 8.9–10.3)
Chloride: 107 mmol/L (ref 98–111)
Creatinine, Ser: 1 mg/dL (ref 0.61–1.24)
GFR calc Af Amer: >60 mL/min (ref >60)
GFR calc non Af Amer: >60 mL/min (ref >60)
Glucose, Bld: 88 mg/dL (ref 70–99)
Potassium: 4.2 mmol/L (ref 3.5–5.1)
Sodium: 140 mmol/L (ref 135–145)
Total Bilirubin: 1 mg/dL (ref 0.3–1.2)
Total Protein: 7.3 g/dL (ref 6.5–8.1)

## 2019-07-26 LAB — LACTATE DEHYDROGENASE: LDH: 102 U/L (ref 98–192)

## 2019-07-27 LAB — MULTIPLE MYELOMA PANEL, SERUM
Albumin SerPl Elph-Mcnc: 3.9 g/dL (ref 2.9–4.4)
Albumin/Glob SerPl: 1.4 (ref 0.7–1.7)
Alpha 1: 0.2 g/dL (ref 0.0–0.4)
Alpha2 Glob SerPl Elph-Mcnc: 0.6 g/dL (ref 0.4–1.0)
B-Globulin SerPl Elph-Mcnc: 1.9 g/dL — ABNORMAL HIGH (ref 0.7–1.3)
Gamma Glob SerPl Elph-Mcnc: 0.2 g/dL — ABNORMAL LOW (ref 0.4–1.8)
Globulin, Total: 2.8 g/dL (ref 2.2–3.9)
IgA: 35 mg/dL — ABNORMAL LOW (ref 90–386)
IgG (Immunoglobin G), Serum: 1437 mg/dL (ref 603–1613)
IgM (Immunoglobulin M), Srm: 12 mg/dL — ABNORMAL LOW (ref 20–172)
M Protein SerPl Elph-Mcnc: 0.9 g/dL — ABNORMAL HIGH
Total Protein ELP: 6.7 g/dL (ref 6.0–8.5)

## 2019-07-27 LAB — KAPPA/LAMBDA LIGHT CHAINS
Kappa free light chain: 170 mg/L — ABNORMAL HIGH (ref 3.3–19.4)
Kappa, lambda light chain ratio: 22.97 — ABNORMAL HIGH (ref 0.26–1.65)
Lambda free light chains: 7.4 mg/L (ref 5.7–26.3)

## 2019-08-02 ENCOUNTER — Ambulatory Visit (HOSPITAL_COMMUNITY): Payer: Medicare Other | Admitting: Hematology

## 2019-08-09 ENCOUNTER — Inpatient Hospital Stay (HOSPITAL_BASED_OUTPATIENT_CLINIC_OR_DEPARTMENT_OTHER): Payer: Medicare Other | Admitting: Hematology

## 2019-08-09 ENCOUNTER — Other Ambulatory Visit: Payer: Self-pay

## 2019-08-09 VITALS — BP 125/73 | HR 69 | Temp 97.7°F | Resp 17 | Wt 163.1 lb

## 2019-08-09 DIAGNOSIS — C8310 Mantle cell lymphoma, unspecified site: Secondary | ICD-10-CM | POA: Diagnosis not present

## 2019-08-09 DIAGNOSIS — D479 Neoplasm of uncertain behavior of lymphoid, hematopoietic and related tissue, unspecified: Secondary | ICD-10-CM | POA: Diagnosis not present

## 2019-08-09 DIAGNOSIS — D472 Monoclonal gammopathy: Secondary | ICD-10-CM

## 2019-08-09 MED ORDER — INFLUENZA VAC SPLIT QUAD 0.5 ML IM SUSY: 0.5000 mL | PREFILLED_SYRINGE | Freq: Once | INTRAMUSCULAR | Status: DC

## 2019-09-26 ENCOUNTER — Other Ambulatory Visit: Payer: Self-pay

## 2019-09-26 ENCOUNTER — Ambulatory Visit (HOSPITAL_COMMUNITY)
Admission: RE | Admit: 2019-09-26 | Discharge: 2019-09-26 | Disposition: A | Discharge status: Home / Self Care | Payer: Medicare Other | Source: Ambulatory Visit | Attending: Hematology | Admitting: Hematology

## 2019-09-26 DIAGNOSIS — D472 Monoclonal gammopathy: Secondary | ICD-10-CM | POA: Insufficient documentation

## 2019-09-26 DIAGNOSIS — Z0389 Encounter for observation for other suspected diseases and conditions ruled out: Secondary | ICD-10-CM | POA: Diagnosis not present

## 2019-09-28 NOTE — Progress Notes (Signed)
[place], [place] [address]   CLINIC:  Medical Oncology/Hematology  PCP:  [name], MD [place] [address] [phone]   REASON FOR VISIT:  Follow-up for  Indolent mantle cell lymphoma  CURRENT THERAPY: Clinical Surveillance     INTERVAL HISTORY:  Lance Erickson 58 y.o. male presents today for follow-up.  Reports overall doing well.  Denies any change in his health history since his last visit.  No significant fatigue.  Denies any lymphadenopathy.  Denies any fevers, chills, night sweats.  No weight loss.  No recent infections.  He is here for repeat labs and office visit.    REVIEW OF SYSTEMS:  Review of Systems  Constitutional: Negative.   HENT:  Negative.   Eyes: Negative.   Respiratory: Negative.   Cardiovascular: Negative.   Gastrointestinal: Negative.   Endocrine: Negative.   Genitourinary: Negative.    Musculoskeletal: Positive for arthralgias.  Skin: Negative.   Neurological: Negative.   Hematological: Negative.   Psychiatric/Behavioral: Negative.      PAST MEDICAL/SURGICAL HISTORY:  Past Medical History:  Diagnosis Date  . Cancer ([facility]) 08/2015   skin, nose  . Leukemia ([facility])   . Spinal bifida, closed    Past Surgical History:  Procedure Laterality Date  . FRACTURE SURGERY     left arm   . INGUINAL HERNIA REPAIR Right      SOCIAL HISTORY:  Social History   Socioeconomic History  . Marital status: Single    Spouse name: Not on file  . Number of children: Not on file  . Years of education: Not on file  . Highest education level: Not on file  Occupational History  . Occupation: disabled  Social Needs  . Financial resource strain: Not hard at all  . Food insecurity    Worry: Never true    Inability: Never true  . Transportation needs    Medical: No    Non-medical: No  Tobacco Use  . Smoking status: Never Smoker  . Smokeless tobacco: Never Used  Substance and Sexual Activity  .  Alcohol use: No  . Drug use: No  . Sexual activity: Not Currently  Lifestyle  . Physical activity    Days per week: Not on file    Minutes per session: Not on file  . Stress: Not at all  Relationships  . Social connections    Talks on phone: More than three times a week    Gets together: More than three times a week    Attends religious service: More than 4 times per year    Active member of club or organization: Yes    Attends meetings of clubs or organizations: More than 4 times per year    Relationship status: Married  . Intimate partner violence    Fear of current or ex partner: Not on file    Emotionally abused: Not on file    Physically abused: Not on file    Forced sexual activity: Not on file  Other Topics Concern  . Not on file  Social History Narrative  . Not on file    FAMILY HISTORY:  Family History  Problem Relation Age of Onset  . Hypertension Mother   . Deep vein thrombosis Mother   . Hyperlipidemia Mother   . Cancer Mother        skin  . Heart attack Father   . Diabetes Brother   . Hypertension Brother   .  Hyperlipidemia Brother   . Heart attack Brother   . Arthritis Brother        back pain  . Arthritis Brother        back pain  . Cancer Maternal Grandmother        varian and uterus cancer  . Heart attack Maternal Grandmother   . Alcohol abuse Maternal Grandfather   . Tuberculosis Maternal Grandfather   . Heart disease Paternal Grandmother   . Pneumonia Paternal Grandfather     CURRENT MEDICATIONS:  No outpatient encounter medications on file as of 08/09/2019.   Facility-Administered Encounter Medications as of 08/09/2019  Medication  . influenza vac split quadrivalent PF (FLUARIX) injection 0.5 mL    ALLERGIES:  No Known Allergies   PHYSICAL EXAM:  ECOG Performance status: 1  Vitals:   08/09/19 1000  BP: 125/73  Pulse: 69  Resp: 17  Temp: 97.7 F (36.5 C)  SpO2: 99%   Filed Weights   08/09/19 1000  Weight: 163 lb 1 oz (74  kg)    Physical Exam Constitutional:      Appearance: Normal appearance.  HENT:     Head: Normocephalic.     Right Ear: External ear normal.     Left Ear: External ear normal.     Nose: Nose normal.     Mouth/Throat:     Pharynx: Oropharynx is clear.  Eyes:     Conjunctiva/sclera: Conjunctivae normal.  Neck:     Musculoskeletal: Normal range of motion.  Cardiovascular:     Rate and Rhythm: Normal rate and regular rhythm.     Pulses: Normal pulses.     Heart sounds: Normal heart sounds.  Pulmonary:     Effort: Pulmonary effort is normal.     Breath sounds: Normal breath sounds.  Abdominal:     General: Bowel sounds are normal.  Musculoskeletal: Normal range of motion.  Skin:    General: Skin is warm.  Neurological:     General: No focal deficit present.     Mental Status: He is alert and oriented to person, place, and time.  Psychiatric:        Mood and Affect: Mood normal.        Behavior: Behavior normal.        Thought Content: Thought content normal.        Judgment: Judgment normal.      LABORATORY DATA:  I have reviewed the labs as listed.  CBC    Component Value Date/Time   WBC 28.5 (H) 07/26/2019 1131   RBC 4.46 07/26/2019 1131   HGB 12.3 (L) 07/26/2019 1131   HGB 12.9 (L) 10/08/2017 0950   HCT 39.6 07/26/2019 1131   HCT 38.5 10/08/2017 0950   PLT 126 (L) 07/26/2019 1131   PLT 138 (L) 10/08/2017 0950   MCV 88.8 07/26/2019 1131   MCV 85 10/08/2017 0950   MCH 27.6 07/26/2019 1131   MCHC 31.1 07/26/2019 1131   RDW 14.3 07/26/2019 1131   RDW 14.7 10/08/2017 0950   LYMPHSABS 24.0 (H) 07/26/2019 1131   LYMPHSABS 11.2 (H) 10/08/2017 0950   MONOABS 0.6 07/26/2019 1131   EOSABS 0.3 07/26/2019 1131   EOSABS 0.1 10/08/2017 0950   BASOSABS 0.1 07/26/2019 1131   BASOSABS 0.1 10/08/2017 0950   CMP Latest Ref Rng & Units 07/26/2019 03/25/2019 11/15/2018  Glucose 70 - 99 mg/dL 88 93 96  BUN 6 - 20 mg/dL 22(H) 25(H) 17  Creatinine 0.61 - 1.24 mg/dL 1.00 0.95  0.91  Sodium 135 - 145 mmol/L 140 138 137  Potassium 3.5 - 5.1 mmol/L 4.2 5.0 4.3  Chloride 98 - 111 mmol/L 107 104 108  CO2 22 - 32 mmol/L '24 26 22  ' Calcium 8.9 - 10.3 mg/dL 9.2 9.3 9.1  Total Protein 6.5 - 8.1 g/dL 7.3 7.3 7.1  Total Bilirubin 0.3 - 1.2 mg/dL 1.0 0.7 0.9  Alkaline Phos 38 - 126 U/L 48 49 46  AST 15 - 41 U/L '20 19 23  ' ALT 0 - 44 U/L '14 12 15         ' ASSESSMENT & PLAN:   Lymphoproliferative disease ([facility]) 1.  Indolent mantle cell lymphoma: - He was evaluated for leukocytosis and lymphocytosis. - Bone marrow biopsy on 12/03/2017 showed slightly hypercellular marrow with involvement by NHL.  Bone marrow is predominantly involved by small lymphoid cells expressing pan-B cell antigens including CD20, CD5 and cyclin D1 expression.  Chromosome analysis shows 40, XY.  CLL FISH panel was negative.  There is also minor population of monoclonal, kappa restricted plasma cells representing 2% of cells. -PET/CT scan on 11/16/2018 shows no appreciable change in low-level metabolism (maximum SUV 2.3) associated with mild bilateral axillary and right inguinal adenopathy.  Mild splenomegaly measuring 13.6 cm, mildly decreased from prior.  No splenic hypermetabolism.  No new or progressive sites of lymphoma. -I reviewed his blood work today.  White count was 24.6, hemoglobin 13 and platelet count 121.  Increase in absolute lymphocyte count. -SPEP showed 0.9 g/dL of M spike.  Elevated kappa light chains of 170.0 with light chain ratio 22.97. LDH was normal at 102.  - Skeletal survey was done did not show any lytic lesions.  We will repeat skeletal survey this year. - M spike is likely associated with indolent lymphoma. -Labs are relatively stable.  -We will do scans only if clinical condition dictates. - Indolent mantle cell lymphoma associated with elevated white count usually has a very indolent course. - RTC in 4 mths.       Orders placed this encounter:  Orders Placed This  Encounter  Procedures  . [place] [phone]

## 2019-09-28 NOTE — Assessment & Plan Note (Addendum)
1.  Indolent mantle cell lymphoma: - He was evaluated for leukocytosis and lymphocytosis. - Bone marrow biopsy on 12/03/2017 showed slightly hypercellular marrow with involvement by NHL.  Bone marrow is predominantly involved by small lymphoid cells expressing pan-B cell antigens including CD20, CD5 and cyclin D1 expression.  Chromosome analysis shows 56, XY.  CLL FISH panel was negative.  There is also minor population of monoclonal, kappa restricted plasma cells representing 2% of cells. -PET/CT scan on 11/16/2018 shows no appreciable change in low-level metabolism (maximum SUV 2.3) associated with mild bilateral axillary and right inguinal adenopathy.  Mild splenomegaly measuring 13.6 cm, mildly decreased from prior.  No splenic hypermetabolism.  No new or progressive sites of lymphoma. -I reviewed his blood work today.  White count was 24.6, hemoglobin 13 and platelet count 121.  Increase in absolute lymphocyte count. -SPEP showed 0.9 g/dL of M spike.  Elevated kappa light chains of 170.0 with light chain ratio 22.97. LDH was normal at 102.  - Skeletal survey was done did not show any lytic lesions.  We will repeat skeletal survey this year. - M spike is likely associated with indolent lymphoma. -Labs are relatively stable.  -We will do scans only if clinical condition dictates. - Indolent mantle cell lymphoma associated with elevated white count usually has a very indolent course. - RTC in 4 mths.

## 2019-11-15 ENCOUNTER — Other Ambulatory Visit (HOSPITAL_COMMUNITY): Payer: Self-pay

## 2019-11-15 DIAGNOSIS — D472 Monoclonal gammopathy: Secondary | ICD-10-CM

## 2019-11-15 DIAGNOSIS — D479 Neoplasm of uncertain behavior of lymphoid, hematopoietic and related tissue, unspecified: Secondary | ICD-10-CM

## 2019-11-16 ENCOUNTER — Inpatient Hospital Stay (HOSPITAL_COMMUNITY): Payer: Medicare Other | Attending: Hematology

## 2019-11-16 ENCOUNTER — Other Ambulatory Visit: Payer: Self-pay

## 2019-11-16 DIAGNOSIS — D479 Neoplasm of uncertain behavior of lymphoid, hematopoietic and related tissue, unspecified: Secondary | ICD-10-CM

## 2019-11-16 DIAGNOSIS — D472 Monoclonal gammopathy: Secondary | ICD-10-CM

## 2019-11-16 DIAGNOSIS — C8310 Mantle cell lymphoma, unspecified site: Secondary | ICD-10-CM | POA: Insufficient documentation

## 2019-11-16 LAB — COMPREHENSIVE METABOLIC PANEL
ALT: 14 U/L (ref 0–44)
AST: 19 U/L (ref 15–41)
Albumin: 3.9 g/dL (ref 3.5–5.0)
Alkaline Phosphatase: 46 U/L (ref 38–126)
Anion gap: 7 (ref 5–15)
BUN: 20 mg/dL (ref 6–20)
CO2: 28 mmol/L (ref 22–32)
Calcium: 9.2 mg/dL (ref 8.9–10.3)
Chloride: 103 mmol/L (ref 98–111)
Creatinine, Ser: 1.2 mg/dL (ref 0.61–1.24)
GFR calc Af Amer: >60 mL/min (ref >60)
GFR calc non Af Amer: >60 mL/min (ref >60)
Glucose, Bld: 77 mg/dL (ref 70–99)
Potassium: 4.6 mmol/L (ref 3.5–5.1)
Sodium: 138 mmol/L (ref 135–145)
Total Bilirubin: 0.7 mg/dL (ref 0.3–1.2)
Total Protein: 7.3 g/dL (ref 6.5–8.1)

## 2019-11-16 LAB — CBC WITH DIFFERENTIAL/PLATELET
Abs Immature Granulocytes: 0.08 10*3/uL — ABNORMAL HIGH (ref 0.00–0.07)
Basophils Absolute: 0.2 10*3/uL — ABNORMAL HIGH (ref 0.0–0.1)
Basophils Relative: 1 %
Eosinophils Absolute: 0.2 10*3/uL (ref 0.0–0.5)
Eosinophils Relative: 1 %
HCT: 37.3 % — ABNORMAL LOW (ref 39.0–52.0)
Hemoglobin: 11.6 g/dL — ABNORMAL LOW (ref 13.0–17.0)
Immature Granulocytes: 0 %
Lymphocytes Relative: 86 %
Lymphs Abs: 29.7 10*3/uL — ABNORMAL HIGH (ref 0.7–4.0)
MCH: 27.6 pg (ref 26.0–34.0)
MCHC: 31.1 g/dL (ref 30.0–36.0)
MCV: 88.6 fL (ref 80.0–100.0)
Monocytes Absolute: 0.6 10*3/uL (ref 0.1–1.0)
Monocytes Relative: 2 %
Neutro Abs: 3.2 10*3/uL (ref 1.7–7.7)
Neutrophils Relative %: 10 %
Platelets: 112 10*3/uL — ABNORMAL LOW (ref 150–400)
RBC: 4.21 MIL/uL — ABNORMAL LOW (ref 4.22–5.81)
RDW: 15.2 % (ref 11.5–15.5)
WBC Morphology: ABNORMAL
WBC: 34 10*3/uL — ABNORMAL HIGH (ref 4.0–10.5)
nRBC: 0 % (ref 0.0–0.2)

## 2019-11-16 LAB — LACTATE DEHYDROGENASE: LDH: 103 U/L (ref 98–192)

## 2019-11-17 LAB — KAPPA/LAMBDA LIGHT CHAINS
Kappa free light chain: 196 mg/L — ABNORMAL HIGH (ref 3.3–19.4)
Kappa, lambda light chain ratio: 42.61 — ABNORMAL HIGH (ref 0.26–1.65)
Lambda free light chains: 4.6 mg/L — ABNORMAL LOW (ref 5.7–26.3)

## 2019-11-18 LAB — MULTIPLE MYELOMA PANEL, SERUM
Albumin SerPl Elph-Mcnc: 4.1 g/dL (ref 2.9–4.4)
Albumin/Glob SerPl: 1.5 (ref 0.7–1.7)
Alpha 1: 0.2 g/dL (ref 0.0–0.4)
Alpha2 Glob SerPl Elph-Mcnc: 0.4 g/dL (ref 0.4–1.0)
B-Globulin SerPl Elph-Mcnc: 2.1 g/dL — ABNORMAL HIGH (ref 0.7–1.3)
Gamma Glob SerPl Elph-Mcnc: 0.2 g/dL — ABNORMAL LOW (ref 0.4–1.8)
Globulin, Total: 2.8 g/dL (ref 2.2–3.9)
IgA: 30 mg/dL — ABNORMAL LOW (ref 90–386)
IgG (Immunoglobin G), Serum: 1480 mg/dL (ref 603–1613)
IgM (Immunoglobulin M), Srm: 12 mg/dL — ABNORMAL LOW (ref 20–172)
M Protein SerPl Elph-Mcnc: 1.2 g/dL — ABNORMAL HIGH
Total Protein ELP: 6.9 g/dL (ref 6.0–8.5)

## 2019-11-22 ENCOUNTER — Other Ambulatory Visit: Payer: Self-pay

## 2019-11-23 ENCOUNTER — Encounter (HOSPITAL_COMMUNITY): Payer: Self-pay | Admitting: Hematology

## 2019-11-23 ENCOUNTER — Inpatient Hospital Stay (HOSPITAL_COMMUNITY): Payer: Medicare Other | Admitting: Hematology

## 2019-11-23 DIAGNOSIS — D479 Neoplasm of uncertain behavior of lymphoid, hematopoietic and related tissue, unspecified: Secondary | ICD-10-CM

## 2019-11-23 DIAGNOSIS — C8310 Mantle cell lymphoma, unspecified site: Secondary | ICD-10-CM | POA: Diagnosis not present

## 2019-11-23 NOTE — Patient Instructions (Signed)
[facility]  Discharge Instructions:  You saw Renee Nester, NP, today. _______________________________________________________________  Thank you for choosing [facility] to provide your oncology and hematology care.  To afford each patient quality time with our providers, please arrive at least 15 minutes before your scheduled appointment.  You need to re-schedule your appointment if you arrive 10 or more minutes late.  We strive to give you quality time with our providers, and arriving late affects you and other patients whose appointments are after yours.  Also, if you no show three or more times for appointments you may be dismissed from the clinic.  Again, thank you for choosing [facility]. Our hope is that these requests will allow you access to exceptional care and in a timely manner. _______________________________________________________________  If you have questions after your visit, please contact our office at [phone] between the hours of 8:30 a.m. and 5:00 p.m. Voicemails left after 4:30 p.m. will not be returned until the following business day. _______________________________________________________________  For prescription refill requests, have your pharmacy contact our office. _______________________________________________________________  Recommendations made by the consultant and any test results will be sent to your referring physician. _______________________________________________________________ 

## 2019-11-23 NOTE — Progress Notes (Signed)
 [facility] [address] [place], [place] [address]   CLINIC:  Medical Oncology/Hematology  PCP:  Stacks, Warren, MD [address] [place]  [place] [address] [phone]   REASON FOR VISIT:  Follow-up for Indolent Mantle cell Lymphoma   CURRENT THERAPY: Clinical surveillance    INTERVAL HISTORY:  Lance Erickson 58 y.o. male presents today for follow-up.  He reports overall doing well.  He denies any changes in his health history since his last office visit.  He denies any fevers, chills, night sweats.  Denies any abdominal pain.  No change in bowel habits.  Appetite is stable.  No weight loss.  He is here for repeat labs and office visit.   REVIEW OF SYSTEMS:  Review of Systems  All other systems reviewed and are negative.    PAST MEDICAL/SURGICAL HISTORY:  Past Medical History:  Diagnosis Date  . Cancer ([facility]) 08/2015   skin, nose  . Leukemia ([facility])   . Spinal bifida, closed    Past Surgical History:  Procedure Laterality Date  . FRACTURE SURGERY     left arm   . INGUINAL HERNIA REPAIR Right      SOCIAL HISTORY:  Social History   Socioeconomic History  . Marital status: Single    Spouse name: Not on file  . Number of children: Not on file  . Years of education: Not on file  . Highest education level: Not on file  Occupational History  . Occupation: disabled  Tobacco Use  . Smoking status: Never Smoker  . Smokeless tobacco: Never Used  Substance and Sexual Activity  . Alcohol use: No  . Drug use: No  . Sexual activity: Not Currently  Other Topics Concern  . Not on file  Social History Narrative  . Not on file   Social Determinants of Health   Financial Resource Strain:   . Difficulty of Paying Living Expenses: Not on file  Food Insecurity:   . Worried About Running Out of Food in the Last Year: Not on file  . Ran Out of Food in the Last Year: Not on file  Transportation Needs:   . Lack of Transportation (Medical): Not on file  . Lack of  Transportation (Non-Medical): Not on file  Physical Activity:   . Days of Exercise per Week: Not on file  . Minutes of Exercise per Session: Not on file  Stress:   . Feeling of Stress : Not on file  Social Connections:   . Frequency of Communication with Friends and Family: Not on file  . Frequency of Social Gatherings with Friends and Family: Not on file  . Attends Religious Services: Not on file  . Active Member of Clubs or Organizations: Not on file  . Attends Club or Organization Meetings: Not on file  . Marital Status: Not on file  Intimate Partner Violence:   . Fear of Current or Ex-Partner: Not on file  . Emotionally Abused: Not on file  . Physically Abused: Not on file  . Sexually Abused: Not on file    FAMILY HISTORY:  Family History  Problem Relation Age of Onset  . Hypertension Mother   . Deep vein thrombosis Mother   . Hyperlipidemia Mother   . Cancer Mother        skin  . Heart attack Father   . Diabetes Brother   . Hypertension Brother   . Hyperlipidemia Brother   . Heart attack Brother   . Arthritis Brother          back pain  . Arthritis Brother        back pain  . Cancer Maternal Grandmother        varian and uterus cancer  . Heart attack Maternal Grandmother   . Alcohol abuse Maternal Grandfather   . Tuberculosis Maternal Grandfather   . Heart disease Paternal Grandmother   . Pneumonia Paternal Grandfather     CURRENT MEDICATIONS:  No outpatient encounter medications on file as of 11/23/2019.   No facility-administered encounter medications on file as of 11/23/2019.    ALLERGIES:  No Known Allergies   PHYSICAL EXAM:  ECOG Performance status: 0  Vitals:   11/23/19 1154  BP: 128/68  Pulse: 66  Resp: 16  Temp: 97.7 F (36.5 C)  SpO2: 99%   Filed Weights   11/23/19 1154  Weight: 166 lb 6.4 oz (75.5 kg)    Physical Exam Constitutional:      Appearance: Normal appearance.  HENT:     Right Ear: External ear normal.     Left Ear:  External ear normal.     Nose: Nose normal.     Mouth/Throat:     Pharynx: Oropharynx is clear.  Eyes:     Conjunctiva/sclera: Conjunctivae normal.  Cardiovascular:     Rate and Rhythm: Normal rate and regular rhythm.     Pulses: Normal pulses.     Heart sounds: Normal heart sounds.  Pulmonary:     Effort: Pulmonary effort is normal.     Breath sounds: Normal breath sounds.  Abdominal:     General: Bowel sounds are normal.  Musculoskeletal:        General: Normal range of motion.  Skin:    General: Skin is warm.  Neurological:     General: No focal deficit present.     Mental Status: He is alert and oriented to person, place, and time.  Psychiatric:        Mood and Affect: Mood normal.        Behavior: Behavior normal.      LABORATORY DATA:  I have reviewed the labs as listed.  CBC    Component Value Date/Time   WBC 34.0 (H) 11/16/2019 1103   RBC 4.21 (L) 11/16/2019 1103   HGB 11.6 (L) 11/16/2019 1103   HGB 12.9 (L) 10/08/2017 0950   HCT 37.3 (L) 11/16/2019 1103   HCT 38.5 10/08/2017 0950   PLT 112 (L) 11/16/2019 1103   PLT 138 (L) 10/08/2017 0950   MCV 88.6 11/16/2019 1103   MCV 85 10/08/2017 0950   MCH 27.6 11/16/2019 1103   MCHC 31.1 11/16/2019 1103   RDW 15.2 11/16/2019 1103   RDW 14.7 10/08/2017 0950   LYMPHSABS 29.7 (H) 11/16/2019 1103   LYMPHSABS 11.2 (H) 10/08/2017 0950   MONOABS 0.6 11/16/2019 1103   EOSABS 0.2 11/16/2019 1103   EOSABS 0.1 10/08/2017 0950   BASOSABS 0.2 (H) 11/16/2019 1103   BASOSABS 0.1 10/08/2017 0950   CMP Latest Ref Rng & Units 11/16/2019 07/26/2019 03/25/2019  Glucose 70 - 99 mg/dL 77 88 93  BUN 6 - 20 mg/dL 20 22(H) 25(H)  Creatinine 0.61 - 1.24 mg/dL 1.20 1.00 0.95  Sodium 135 - 145 mmol/L 138 140 138  Potassium 3.5 - 5.1 mmol/L 4.6 4.2 5.0  Chloride 98 - 111 mmol/L 103 107 104  CO2 22 - 32 mmol/L 28 24 26  Calcium 8.9 - 10.3 mg/dL 9.2 9.2 9.3  Total Protein 6.5 - 8.1 g/dL 7.3 7.3 7.3  Total   Bilirubin 0.3 - 1.2 mg/dL 0.7  1.0 0.7  Alkaline Phos 38 - 126 U/L 46 48 49  AST 15 - 41 U/L 19 20 19  ALT 0 - 44 U/L 14 14 12          ASSESSMENT & PLAN:   Lymphoproliferative disease ([facility]) 1.  Indolent mantle cell lymphoma: - He was evaluated for leukocytosis and lymphocytosis. - Bone marrow biopsy on 12/03/2017 showed slightly hypercellular marrow with involvement by NHL.  Bone marrow is predominantly involved by small lymphoid cells expressing pan-B cell antigens including CD20, CD5 and cyclin D1 expression.  Chromosome analysis shows 46, XY.  CLL FISH panel was negative.  There is also minor population of monoclonal, kappa restricted plasma cells representing 2% of cells. -PET/CT scan on 11/16/2018 shows no appreciable change in low-level metabolism (maximum SUV 2.3) associated with mild bilateral axillary and right inguinal adenopathy.  Mild splenomegaly measuring 13.6 cm, mildly decreased from prior.  No splenic hypermetabolism.  No new or progressive sites of lymphoma. -I reviewed his blood work today.  White count was 24.6, hemoglobin 13 and platelet count 121.  Increase in absolute lymphocyte count. -SPEP showed 0.9 g/dL of M spike.  Elevated kappa light chains of 170.0 with light chain ratio 22.97. LDH was normal at 102.  - Skeletal survey was done did not show any lytic lesions.  We will repeat skeletal survey this year. - M spike is likely associated with indolent lymphoma. -Labs are relatively stable.  -We will do scans only if clinical condition dictates. - Indolent mantle cell lymphoma associated with elevated white count usually has a very indolent course. - RTC in 4 mths.       Kim R Nester  [facility] [phone]   

## 2019-11-23 NOTE — Assessment & Plan Note (Signed)
1.  Indolent mantle cell lymphoma: - He was evaluated for leukocytosis and lymphocytosis. - Bone marrow biopsy on 12/03/2017 showed slightly hypercellular marrow with involvement by NHL.  Bone marrow is predominantly involved by small lymphoid cells expressing pan-B cell antigens including CD20, CD5 and cyclin D1 expression.  Chromosome analysis shows 69, XY.  CLL FISH panel was negative.  There is also minor population of monoclonal, kappa restricted plasma cells representing 2% of cells. -PET/CT scan on 11/16/2018 shows no appreciable change in low-level metabolism (maximum SUV 2.3) associated with mild bilateral axillary and right inguinal adenopathy.  Mild splenomegaly measuring 13.6 cm, mildly decreased from prior.  No splenic hypermetabolism.  No new or progressive sites of lymphoma. -I reviewed his blood work today.  White count was 24.6, hemoglobin 13 and platelet count 121.  Increase in absolute lymphocyte count. -SPEP showed 0.9 g/dL of M spike.  Elevated kappa light chains of 170.0 with light chain ratio 22.97. LDH was normal at 102.  - Skeletal survey was done did not show any lytic lesions.  We will repeat skeletal survey this year. - M spike is likely associated with indolent lymphoma. -Labs are relatively stable.  -We will do scans only if clinical condition dictates. - Indolent mantle cell lymphoma associated with elevated white count usually has a very indolent course. - RTC in 4 mths.

## 2020-03-14 ENCOUNTER — Other Ambulatory Visit (HOSPITAL_COMMUNITY): Payer: Self-pay | Admitting: *Deleted

## 2020-03-14 DIAGNOSIS — D479 Neoplasm of uncertain behavior of lymphoid, hematopoietic and related tissue, unspecified: Secondary | ICD-10-CM

## 2020-03-14 DIAGNOSIS — D472 Monoclonal gammopathy: Secondary | ICD-10-CM

## 2020-03-15 ENCOUNTER — Other Ambulatory Visit: Payer: Self-pay

## 2020-03-15 ENCOUNTER — Inpatient Hospital Stay (HOSPITAL_COMMUNITY): Payer: Medicare Other | Attending: Hematology

## 2020-03-15 DIAGNOSIS — D472 Monoclonal gammopathy: Secondary | ICD-10-CM

## 2020-03-15 DIAGNOSIS — C8319 Mantle cell lymphoma, extranodal and solid organ sites: Secondary | ICD-10-CM | POA: Insufficient documentation

## 2020-03-15 DIAGNOSIS — D479 Neoplasm of uncertain behavior of lymphoid, hematopoietic and related tissue, unspecified: Secondary | ICD-10-CM

## 2020-03-15 LAB — COMPREHENSIVE METABOLIC PANEL
ALT: 14 U/L (ref 0–44)
AST: 20 U/L (ref 15–41)
Albumin: 4 g/dL (ref 3.5–5.0)
Alkaline Phosphatase: 55 U/L (ref 38–126)
Anion gap: 6 (ref 5–15)
BUN: 22 mg/dL — ABNORMAL HIGH (ref 6–20)
CO2: 27 mmol/L (ref 22–32)
Calcium: 9.3 mg/dL (ref 8.9–10.3)
Chloride: 103 mmol/L (ref 98–111)
Creatinine, Ser: 1.09 mg/dL (ref 0.61–1.24)
GFR calc Af Amer: >60 mL/min (ref >60)
GFR calc non Af Amer: >60 mL/min (ref >60)
Glucose, Bld: 96 mg/dL (ref 70–99)
Potassium: 4.8 mmol/L (ref 3.5–5.1)
Sodium: 136 mmol/L (ref 135–145)
Total Bilirubin: 0.7 mg/dL (ref 0.3–1.2)
Total Protein: 7.9 g/dL (ref 6.5–8.1)

## 2020-03-15 LAB — CBC WITH DIFFERENTIAL/PLATELET
Abs Immature Granulocytes: 0.1 10*3/uL — ABNORMAL HIGH (ref 0.00–0.07)
Basophils Absolute: 0.2 10*3/uL — ABNORMAL HIGH (ref 0.0–0.1)
Basophils Relative: 0 %
Eosinophils Absolute: 0.3 10*3/uL (ref 0.0–0.5)
Eosinophils Relative: 1 %
HCT: 35.3 % — ABNORMAL LOW (ref 39.0–52.0)
Hemoglobin: 10.6 g/dL — ABNORMAL LOW (ref 13.0–17.0)
Immature Granulocytes: 0 %
Lymphocytes Relative: 84 %
Lymphs Abs: 33.3 10*3/uL — ABNORMAL HIGH (ref 0.7–4.0)
MCH: 26.4 pg (ref 26.0–34.0)
MCHC: 30 g/dL (ref 30.0–36.0)
MCV: 87.8 fL (ref 80.0–100.0)
Monocytes Absolute: 0.6 10*3/uL (ref 0.1–1.0)
Monocytes Relative: 2 %
Neutro Abs: 5.3 10*3/uL (ref 1.7–7.7)
Neutrophils Relative %: 13 %
Platelets: 116 10*3/uL — ABNORMAL LOW (ref 150–400)
RBC: 4.02 MIL/uL — ABNORMAL LOW (ref 4.22–5.81)
RDW: 15.7 % — ABNORMAL HIGH (ref 11.5–15.5)
WBC Morphology: ABNORMAL
WBC: 39.7 10*3/uL — ABNORMAL HIGH (ref 4.0–10.5)
nRBC: 0 % (ref 0.0–0.2)

## 2020-03-15 LAB — LACTATE DEHYDROGENASE: LDH: 109 U/L (ref 98–192)

## 2020-03-16 LAB — KAPPA/LAMBDA LIGHT CHAINS
Kappa free light chain: 367.6 mg/L — ABNORMAL HIGH (ref 3.3–19.4)
Kappa, lambda light chain ratio: 96.74 — ABNORMAL HIGH (ref 0.26–1.65)
Lambda free light chains: 3.8 mg/L — ABNORMAL LOW (ref 5.7–26.3)

## 2020-03-16 LAB — MULTIPLE MYELOMA PANEL, SERUM
Albumin SerPl Elph-Mcnc: 3.9 g/dL (ref 2.9–4.4)
Albumin/Glob SerPl: 1.2 (ref 0.7–1.7)
Alpha 1: 0.3 g/dL (ref 0.0–0.4)
Alpha2 Glob SerPl Elph-Mcnc: 0.6 g/dL (ref 0.4–1.0)
B-Globulin SerPl Elph-Mcnc: 2.5 g/dL — ABNORMAL HIGH (ref 0.7–1.3)
Gamma Glob SerPl Elph-Mcnc: 0.2 g/dL — ABNORMAL LOW (ref 0.4–1.8)
Globulin, Total: 3.5 g/dL (ref 2.2–3.9)
IgA: 22 mg/dL — ABNORMAL LOW (ref 90–386)
IgG (Immunoglobin G), Serum: 1796 mg/dL — ABNORMAL HIGH (ref 603–1613)
IgM (Immunoglobulin M), Srm: 13 mg/dL — ABNORMAL LOW (ref 20–172)
M Protein SerPl Elph-Mcnc: 1.5 g/dL — ABNORMAL HIGH
Total Protein ELP: 7.4 g/dL (ref 6.0–8.5)

## 2020-03-22 ENCOUNTER — Ambulatory Visit (HOSPITAL_COMMUNITY): Payer: Medicare Other | Admitting: Nurse Practitioner

## 2020-03-22 ENCOUNTER — Other Ambulatory Visit (HOSPITAL_COMMUNITY): Payer: Self-pay | Admitting: *Deleted

## 2020-03-22 ENCOUNTER — Other Ambulatory Visit: Payer: Self-pay

## 2020-03-22 ENCOUNTER — Inpatient Hospital Stay (HOSPITAL_BASED_OUTPATIENT_CLINIC_OR_DEPARTMENT_OTHER): Payer: Medicare Other | Admitting: Hematology

## 2020-03-22 DIAGNOSIS — C8310 Mantle cell lymphoma, unspecified site: Secondary | ICD-10-CM

## 2020-03-22 NOTE — Progress Notes (Signed)
Virtual Visit via Telephone Note  I connected with Lance Erickson on 03/22/20 at 11:15 AM EDT by telephone and verified that I am speaking with the correct person using two identifiers.   I discussed the limitations, risks, security and privacy concerns of performing an evaluation and management service by telephone and the availability of in person appointments. I also discussed with the patient that there may be a patient responsible charge related to this service. The patient expressed understanding and agreed to proceed.   History of Present Illness: Lance Erickson is followed in our clinic for indolent mantle cell lymphoma.  He had a bone marrow biopsy in January 2019 showed hypercellular marrow with involvement with non-Hodgkin's lymphoma.  Bone marrow is predominantly involved by small lymphoid cells expressing pan B cell antigens including CD20, CD5 and cyclin D1 expression.  Chromosome analysis shows 21, XY.  CLL FISH panel was negative.  There is also minor population of monoclonal kappa restricted plasma cells are present 2%.   Observations/Objective: He denies any recent fevers, night sweats or weight loss.  Reports normal energy levels.  Reports normal appetite.  Denies any swellings in his neck or into the arms or in the groins.  Assessment and Plan:  1.  Indolent mantle cell lymphoma: -Last PET scan was in January 2020 showed mostly low-level metabolic some associated with bilateral axillary and right inguinal adenopathy with mild splenomegaly. -We reviewed labs from 03/15/2020.  M spike is 1.5 g, up from 1.2 g in January.  Calcium is 9.3.  White count is 39.7, up from 34 previously.  Platelet count is 116 and more or less stable. -Hemoglobin dropped by 1 g to 10.6.  MCV is 87.8. -Kappa light chains increased to 367 and ratio increased to 96.74. -Because of his new onset anemia and worsening of light chains and M spike protein, I have recommended a PET CT scan. -We will see if he needs a  biopsy based on the PET scan findings.   Follow Up Instructions: RTC in person after PET scan.  I discussed the assessment and treatment plan with the patient. The patient was provided an opportunity to ask questions and all were answered. The patient agreed with the plan and demonstrated an understanding of the instructions.   The patient was advised to call back or seek an in-person evaluation if the symptoms worsen or if the condition fails to improve as anticipated.  I provided 12 minutes of non-face-to-face time during this encounter.   Derek Jack, MD

## 2020-04-02 ENCOUNTER — Encounter (HOSPITAL_COMMUNITY)
Admission: RE | Admit: 2020-04-02 | Discharge: 2020-04-02 | Disposition: A | Discharge status: Home / Self Care | Payer: Medicare Other | Source: Ambulatory Visit | Attending: Hematology | Admitting: Hematology

## 2020-04-02 ENCOUNTER — Other Ambulatory Visit: Payer: Self-pay

## 2020-04-02 DIAGNOSIS — C8310 Mantle cell lymphoma, unspecified site: Secondary | ICD-10-CM

## 2020-04-02 DIAGNOSIS — C8590 Non-Hodgkin lymphoma, unspecified, unspecified site: Secondary | ICD-10-CM | POA: Diagnosis not present

## 2020-04-02 MED ORDER — FLUDEOXYGLUCOSE F - 18 (FDG) INJECTION
9.8200 | Freq: Once | INTRAVENOUS | Status: AC | PRN
  Administered 2020-04-02: 9.82 via INTRAVENOUS

## 2020-04-05 ENCOUNTER — Other Ambulatory Visit: Payer: Self-pay

## 2020-04-05 ENCOUNTER — Inpatient Hospital Stay (HOSPITAL_BASED_OUTPATIENT_CLINIC_OR_DEPARTMENT_OTHER): Payer: Medicare Other | Admitting: Hematology

## 2020-04-05 VITALS — BP 123/71 | HR 67 | Temp 96.9°F | Resp 18 | Wt 160.0 lb

## 2020-04-05 DIAGNOSIS — C8310 Mantle cell lymphoma, unspecified site: Secondary | ICD-10-CM | POA: Diagnosis not present

## 2020-04-05 DIAGNOSIS — D479 Neoplasm of uncertain behavior of lymphoid, hematopoietic and related tissue, unspecified: Secondary | ICD-10-CM | POA: Diagnosis not present

## 2020-04-05 DIAGNOSIS — D472 Monoclonal gammopathy: Secondary | ICD-10-CM | POA: Diagnosis not present

## 2020-04-05 DIAGNOSIS — C8319 Mantle cell lymphoma, extranodal and solid organ sites: Secondary | ICD-10-CM | POA: Diagnosis not present

## 2020-04-05 NOTE — Patient Instructions (Signed)
[place] at [facility] Discharge Instructions  You were seen today by Dr. Delton Coombes. He went over your recent results. We will be scheduling you for a bone density scan to measure your bone strength due to the recently found fracture in your T11 vertebrae. He will see you back in 2 for labs and follow up.   Thank you for choosing [facility] to provide your oncology and hematology care.  To afford each patient quality time with our provider, please arrive at least 15 minutes before your scheduled appointment time.   If you have a lab appointment with the [facility] please come in thru the  Main Entrance and check in at the main information desk  You need to re-schedule your appointment should you arrive 10 or more minutes late.  We strive to give you quality time with our providers, and arriving late affects you and other patients whose appointments are after yours.  Also, if you no show three or more times for appointments you may be dismissed from the clinic at the providers discretion.     Again, thank you for choosing [facility].  Our hope is that these requests will decrease the amount of time that you wait before being seen by our physicians.       _____________________________________________________________  Should you have questions after your visit to [facility], please contact our office at [phone] between the hours of 8:00 a.m. and 4:30 p.m.  Voicemails left after 4:00 p.m. will not be returned until the following business day.  For prescription refill requests, have your pharmacy contact our office and allow 72 hours.    Cancer Center Support Programs:   > Cancer Support Group  2nd Tuesday of the month 1pm-2pm, Journey Room

## 2020-04-05 NOTE — Progress Notes (Signed)
Lance Erickson,  [id number]   CLINIC:  Medical Oncology/Hematology  PCP:  [name], MD [place] / [place] [address]  [phone]  REASON FOR VISIT:  Follow-up for Indolent Mantle cell Lymphoma  PRIOR THERAPY: None  CURRENT THERAPY: Observation  INTERVAL HISTORY:  Mr. TARQUIN HAPP, a 59 y.o. male, returns for routine follow-up for his Indolent Mantle cell Lymphoma. Demetrick was last seen on 11/23/2019.  He denies recent accidents, falls, or injuries. He declines any previously broken bones. He has not had a bone density scan.  Denies any fevers, night sweats or weight loss.  Denies any recent infections.   REVIEW OF SYSTEMS:  Review of Systems  Constitutional: Negative for appetite change, fatigue and unexpected weight change.  All other systems reviewed and are negative.   PAST MEDICAL/SURGICAL HISTORY:  Past Medical History:  Diagnosis Date  . Cancer ([place]) 08/2015   skin, nose  . Leukemia ([place])   . Spinal bifida, closed    Past Surgical History:  Procedure Laterality Date  . FRACTURE SURGERY     left arm   . INGUINAL HERNIA REPAIR Right     SOCIAL HISTORY:  Social History   Socioeconomic History  . Marital status: Single    Spouse name: Not on file  . Number of children: Not on file  . Years of education: Not on file  . Highest education level: Not on file  Occupational History  . Occupation: disabled  Tobacco Use  . Smoking status: Never Smoker  . Smokeless tobacco: Never Used  Substance and Sexual Activity  . Alcohol use: No  . Drug use: No  . Sexual activity: Not Currently  Other Topics Concern  . Not on file  Social History Narrative  . Not on file   Social Determinants of Health   Financial Resource Strain:   . Difficulty of Paying Living Expenses:   Food Insecurity:   . Worried About Charity fundraiser in the Last Year:   . Arboriculturist in the Last Year:   Transportation Needs:     . Film/video editor (Medical):   Marland Kitchen Lack of Transportation (Non-Medical):   Physical Activity:   . Days of Exercise per Week:   . Minutes of Exercise per Session:   Stress:   . Feeling of Stress :   Social Connections:   . Frequency of Communication with Friends and Family:   . Frequency of Social Gatherings with Friends and Family:   . Attends Religious Services:   . Active Member of Clubs or Organizations:   . Attends Archivist Meetings:   Marland Kitchen Marital Status:   Intimate Partner Violence:   . Fear of Current or Ex-Partner:   . Emotionally Abused:   Marland Kitchen Physically Abused:   . Sexually Abused:     FAMILY HISTORY:  Family History  Problem Relation Age of Onset  . Hypertension Mother   . Deep vein thrombosis Mother   . Hyperlipidemia Mother   . Cancer Mother        skin  . Heart attack Father   . Diabetes Brother   . Hypertension Brother   . Hyperlipidemia Brother   . Heart attack Brother   . Arthritis Brother        back pain  . Arthritis Brother        back pain  . Cancer Maternal Grandmother  varian and uterus cancer  . Heart attack Maternal Grandmother   . Alcohol abuse Maternal Grandfather   . Tuberculosis Maternal Grandfather   . Heart disease Paternal Grandmother   . Pneumonia Paternal Grandfather     CURRENT MEDICATIONS:  No current outpatient medications on file.   No current facility-administered medications for this visit.    ALLERGIES:  No Known Allergies  PHYSICAL EXAM:  Performance status (ECOG): 0 - Asymptomatic  Vitals:   04/05/20 1117  BP: 123/71  Pulse: 67  Resp: 18  Temp: (!) 96.9 F (36.1 C)  SpO2: 100%   Wt Readings from Last 3 Encounters:  04/05/20 160 lb (72.6 kg)  11/23/19 166 lb 6.4 oz (75.5 kg)  08/09/19 163 lb 1 oz (74 kg)   Physical Exam Vitals reviewed.  Cardiovascular:     Heart sounds: Normal heart sounds.  Pulmonary:     Effort: Pulmonary effort is normal.     Breath sounds: Normal breath  sounds.  Abdominal:     Palpations: Abdomen is soft. There is splenomegaly.  Lymphadenopathy:     Upper Body:     Right upper body: Axillary adenopathy present.  Skin:    General: Skin is warm.  Neurological:     General: No focal deficit present.     Mental Status: He is oriented to person, place, and time.  Psychiatric:        Mood and Affect: Mood normal.        Behavior: Behavior normal.     LABORATORY DATA:  I have reviewed the labs as listed.  CBC Latest Ref Rng & Units 03/15/2020 11/16/2019 07/26/2019  WBC 4.0 - 10.5 K/uL 39.7(H) 34.0(H) 28.5(H)  Hemoglobin 13.0 - 17.0 g/dL 10.6(L) 11.6(L) 12.3(L)  Hematocrit 39.0 - 52.0 % 35.3(L) 37.3(L) 39.6  Platelets 150 - 400 K/uL 116(L) 112(L) 126(L)   CMP Latest Ref Rng & Units 03/15/2020 11/16/2019 07/26/2019  Glucose 70 - 99 mg/dL 96 77 88  BUN 6 - 20 mg/dL 22(H) 20 22(H)  Creatinine 0.61 - 1.24 mg/dL 1.09 1.20 1.00  Sodium 135 - 145 mmol/L 136 138 140  Potassium 3.5 - 5.1 mmol/L 4.8 4.6 4.2  Chloride 98 - 111 mmol/L 103 103 107  CO2 22 - 32 mmol/L 27 28 24   Calcium 8.9 - 10.3 mg/dL 9.3 9.2 9.2  Total Protein 6.5 - 8.1 g/dL 7.9 7.3 7.3  Total Bilirubin 0.3 - 1.2 mg/dL 0.7 0.7 1.0  Alkaline Phos 38 - 126 U/L 55 46 48  AST 15 - 41 U/L 20 19 20   ALT 0 - 44 U/L 14 14 14       Component Value Date/Time   RBC 4.02 (L) 03/15/2020 1122   MCV 87.8 03/15/2020 1122   MCV 85 10/08/2017 0950   MCH 26.4 03/15/2020 1122   MCHC 30.0 03/15/2020 1122   RDW 15.7 (H) 03/15/2020 1122   RDW 14.7 10/08/2017 0950   LYMPHSABS 33.3 (H) 03/15/2020 1122   LYMPHSABS 11.2 (H) 10/08/2017 0950   MONOABS 0.6 03/15/2020 1122   EOSABS 0.3 03/15/2020 1122   EOSABS 0.1 10/08/2017 0950   BASOSABS 0.2 (H) 03/15/2020 1122   BASOSABS 0.1 10/08/2017 0950   Lab Results  Component Value Date   TOTALPROTELP 7.4 03/15/2020   ALBUMINELP 4.1 03/25/2019   A1GS 0.2 03/25/2019   A2GS 0.5 03/25/2019   BETS 1.9 (H) 03/25/2019   GAMS 0.3 (L) 03/25/2019   MSPIKE 1.1  (H) 03/25/2019   SPEI Comment 03/25/2019  Lab Results  Component Value Date   KPAFRELGTCHN 367.6 (H) 03/15/2020   LAMBDASER 3.8 (L) 03/15/2020   KAPLAMBRATIO 96.74 (H) 03/15/2020     DIAGNOSTIC IMAGING:  I have independently reviewed the scans and discussed with the patient. NM PET Image Restag (PS) Skull Base To Thigh  Result Date: 04/03/2020 CLINICAL DATA:  Subsequent treatment strategy for lymphoma diagnosed in 2012. Restaging. Left arm COVID-19 vaccine 18 days ago. EXAM: NUCLEAR MEDICINE PET SKULL BASE TO THIGH TECHNIQUE: 9.8 mCi F-18 FDG was injected intravenously. Full-ring PET imaging was performed from the skull base to thigh after the radiotracer. CT data was obtained and used for attenuation correction and anatomic localization. Fasting blood glucose: 91 mg/dl COMPARISON:  11/15/2018 FINDINGS: Mediastinal blood pool activity: SUV max 2.1 Liver activity: SUV max 2.5 NECK: No areas of abnormal hypermetabolism. Incidental CT findings: No cervical adenopathy. CHEST: Index left axillary node measures 1.8 x 3.2 cm and a S.U.V. max of 2.6 on 70/3. Compare 1.9 x 2.9 cm and a S.U.V. max of 2.3 on the prior exam. Index left axillary node measures 1.1 x 3.1 cm and a S.U.V. max of 1.9 on 70/3. Compare 1.3 x 3.0 cm and a S.U.V. max of 2.1 on the prior exam. No mediastinal or hilar hypermetabolic nodes. Incidental CT findings: Heart size accentuated by a pectus excavatum deformity. Small prevascular nodes including at up to 8 mm on 89/3, similar. ABDOMEN/PELVIS: index right inguinal node measures 8 mm and a S.U.V. max of 1.6 on 266/3. Compare 9 mm and a S.U.V. max of 1.7 on the prior exam. No splenic hypermetabolism. Incidental CT findings: Splenomegaly including 15.2 cm maximal transverse dimension today versus 15.6 cm on the prior. Nonobstructive right greater than left renal collecting system calculi. Too small to characterize lower pole left renal lesion. Low-density hepatic lesions are likely cysts.  Fat containing ventral abdominal wall hernia. Moderate prostatomegaly. Presumed undescended right testes. SKELETON: New mild compression deformity at T11 with hypermetabolism, measuring a S.U.V. max of 4.1. There is also a new hypermetabolism within the spinous process of T2, measuring a S.U.V. max of 3.8. Incidental CT findings: Osteopoikilosis. IMPRESSION: 1. Similar bilateral axillary adenopathy with low-level hypermetabolism. Similar isolated right inguinal low-level hypermetabolism. No progressive disease. 2. Similar splenomegaly, without significant hypermetabolism. 3. New T11 compression deformity and hypermetabolism, favored to be posttraumatic. Correlate with clinical history. A spinous process T2 focus of hypermetabolism is new, indeterminate. 4. Incidental findings, including: Bilateral nephrolithiasis. Osteopoikilosis. Probable non descended right testicle. Electronically Signed   By: Abigail Miyamoto M.D.   On: 04/03/2020 09:48     ASSESSMENT:  1.  Indolent mantle cell lymphoma: -BMBX on 12/03/2017 showed hypercellular marrow with involvement by NHL.  Bone marrow is predominantly involved by small lymphoid cells expressing pan B cell antigens including CD20, CD5 and cyclin D1 expression.  Chromosome analysis shows 110, XY.  CLL FISH panel was negative.  Minor population of monoclonal, kappa restricted plasma cells are present 2% of cells. -Labs on 03/15/2020 shows M spike of 1.5 g.  White count is 39.7.  Platelet count is 116.  Hemoglobin is 10.6. -PET scan on 04/02/2020 shows left axillary lymphadenopathy, SUV max 2.6.  No mediastinal or hilar adenopathy.  Right inguinal adenopathy with SUV 1.6.  No splenic hypermetabolism.  Splenomegaly measuring 15.2 cm.  New mild compression deformity at T11 with hypermetabolism, SUV 4.1.  There is also new hypermetabolic within the spinous process of T2, SUV 3.8.    PLAN:  1. Indolent mantle  cell lymphoma: -We reviewed results of the PET scan.  This showed new  findings of T11 compression deformity and focus on T2 spinous process.  No evidence of transformation. -However his hemoglobin dropped to 10.6.  We will keep a close eye on it. -I have recommended 80-month follow-up with repeat blood work.  We will also check ferritin and iron panel.  2.  Bone lesions: -PET scan showed new T11 compression deformity.  There is also spinous process T2 hypermetabolic them. -He does not report any trauma leading to compression fracture. -We will check his vitamin D level.  We will also check bone density prior to next visit.  Orders placed this encounter:  No orders of the defined types were placed in this encounter.    Derek Jack, MD [place] [phone] Total time spent is 30 minutes with more than 50% of the time spent face-to-face discussing and reviewing scans, further work-up, counseling and coordination of care.  I, [employer], am acting as a Education administrator for Dr. Sanda Linger.  I, Derek Jack MD, have reviewed the above documentation for accuracy and completeness, and I agree with the above.

## 2020-06-04 ENCOUNTER — Other Ambulatory Visit: Payer: Self-pay

## 2020-06-04 ENCOUNTER — Ambulatory Visit (HOSPITAL_COMMUNITY)
Admission: RE | Admit: 2020-06-04 | Discharge: 2020-06-04 | Disposition: A | Discharge status: Home / Self Care | Payer: Medicare Other | Source: Ambulatory Visit | Attending: Hematology | Admitting: Hematology

## 2020-06-04 ENCOUNTER — Inpatient Hospital Stay (HOSPITAL_COMMUNITY): Payer: Medicare Other | Attending: Hematology

## 2020-06-04 DIAGNOSIS — C8310 Mantle cell lymphoma, unspecified site: Secondary | ICD-10-CM | POA: Insufficient documentation

## 2020-06-04 DIAGNOSIS — D649 Anemia, unspecified: Secondary | ICD-10-CM | POA: Diagnosis not present

## 2020-06-04 DIAGNOSIS — D472 Monoclonal gammopathy: Secondary | ICD-10-CM | POA: Diagnosis not present

## 2020-06-04 DIAGNOSIS — M8580 Other specified disorders of bone density and structure, unspecified site: Secondary | ICD-10-CM | POA: Diagnosis not present

## 2020-06-04 DIAGNOSIS — D479 Neoplasm of uncertain behavior of lymphoid, hematopoietic and related tissue, unspecified: Secondary | ICD-10-CM | POA: Diagnosis not present

## 2020-06-04 DIAGNOSIS — Z13820 Encounter for screening for osteoporosis: Secondary | ICD-10-CM | POA: Insufficient documentation

## 2020-06-04 DIAGNOSIS — R29890 Loss of height: Secondary | ICD-10-CM | POA: Insufficient documentation

## 2020-06-04 DIAGNOSIS — M8589 Other specified disorders of bone density and structure, multiple sites: Secondary | ICD-10-CM | POA: Diagnosis not present

## 2020-06-04 LAB — IRON AND TIBC
Iron: 48 ug/dL (ref 45–182)
Saturation Ratios: 11 % — ABNORMAL LOW (ref 17.9–39.5)
TIBC: 428 ug/dL (ref 250–450)
UIBC: 380 ug/dL

## 2020-06-04 LAB — CBC WITH DIFFERENTIAL/PLATELET
Abs Immature Granulocytes: 0.06 10*3/uL (ref 0.00–0.07)
Band Neutrophils: 0 %
Basophils Absolute: 0 10*3/uL (ref 0.0–0.1)
Basophils Relative: 0 %
Blasts: 0 %
Eosinophils Absolute: 0 10*3/uL (ref 0.0–0.5)
Eosinophils Relative: 0 %
HCT: 33.2 % — ABNORMAL LOW (ref 39.0–52.0)
Hemoglobin: 9.8 g/dL — ABNORMAL LOW (ref 13.0–17.0)
Immature Granulocytes: 0 %
Lymphocytes Relative: 40 %
Lymphs Abs: 95 10*3/uL — ABNORMAL HIGH (ref 0.7–4.0)
MCH: 25.9 pg — ABNORMAL LOW (ref 26.0–34.0)
MCHC: 29.5 g/dL — ABNORMAL LOW (ref 30.0–36.0)
MCV: 87.8 fL (ref 80.0–100.0)
Metamyelocytes Relative: 0 %
Monocytes Absolute: 0 10*3/uL — ABNORMAL LOW (ref 0.1–1.0)
Monocytes Relative: 1 %
Myelocytes: 0 %
Neutro Abs: 5 10*3/uL (ref 1.7–7.7)
Neutrophils Relative %: 3 %
Platelets: 105 10*3/uL — ABNORMAL LOW (ref 150–400)
Promyelocytes Relative: 0 %
RBC: 3.78 MIL/uL — ABNORMAL LOW (ref 4.22–5.81)
RDW: 16.6 % — ABNORMAL HIGH (ref 11.5–15.5)
WBC: 43.3 10*3/uL — ABNORMAL HIGH (ref 4.0–10.5)
nRBC: 0 % (ref 0.0–0.2)
nRBC: 0 /100 WBC

## 2020-06-04 LAB — COMPREHENSIVE METABOLIC PANEL WITH GFR
ALT: 11 U/L (ref 0–44)
AST: 19 U/L (ref 15–41)
Albumin: 3.7 g/dL (ref 3.5–5.0)
Alkaline Phosphatase: 43 U/L (ref 38–126)
Anion gap: 9 (ref 5–15)
BUN: 23 mg/dL — ABNORMAL HIGH (ref 6–20)
CO2: 25 mmol/L (ref 22–32)
Calcium: 9.1 mg/dL (ref 8.9–10.3)
Chloride: 107 mmol/L (ref 98–111)
Creatinine, Ser: 1.06 mg/dL (ref 0.61–1.24)
GFR calc Af Amer: >60 mL/min
GFR calc non Af Amer: >60 mL/min
Glucose, Bld: 86 mg/dL (ref 70–99)
Potassium: 4.3 mmol/L (ref 3.5–5.1)
Sodium: 141 mmol/L (ref 135–145)
Total Bilirubin: 0.8 mg/dL (ref 0.3–1.2)
Total Protein: 7.6 g/dL (ref 6.5–8.1)

## 2020-06-04 LAB — VITAMIN B12: Vitamin B-12: 571 pg/mL (ref 180–914)

## 2020-06-04 LAB — FOLATE: Folate: 33.7 ng/mL

## 2020-06-04 LAB — VITAMIN D 25 HYDROXY (VIT D DEFICIENCY, FRACTURES): Vit D, 25-Hydroxy: 58.83 ng/mL (ref 30–100)

## 2020-06-04 LAB — LACTATE DEHYDROGENASE: LDH: 102 U/L (ref 98–192)

## 2020-06-04 LAB — FERRITIN: Ferritin: 4 ng/mL — ABNORMAL LOW (ref 24–336)

## 2020-06-06 LAB — PROTEIN ELECTROPHORESIS, SERUM
A/G Ratio: 1 (ref 0.7–1.7)
Albumin ELP: 3.8 g/dL (ref 2.9–4.4)
Alpha-1-Globulin: 0.2 g/dL (ref 0.0–0.4)
Alpha-2-Globulin: 0.5 g/dL (ref 0.4–1.0)
Beta Globulin: 2.8 g/dL — ABNORMAL HIGH (ref 0.7–1.3)
Gamma Globulin: 0.2 g/dL — ABNORMAL LOW (ref 0.4–1.8)
Globulin, Total: 3.7 g/dL (ref 2.2–3.9)
M-Spike, %: 1.9 g/dL — ABNORMAL HIGH
Total Protein ELP: 7.5 g/dL (ref 6.0–8.5)

## 2020-06-07 ENCOUNTER — Inpatient Hospital Stay (HOSPITAL_COMMUNITY): Payer: Medicare Other

## 2020-06-07 ENCOUNTER — Inpatient Hospital Stay (HOSPITAL_BASED_OUTPATIENT_CLINIC_OR_DEPARTMENT_OTHER): Payer: Medicare Other | Admitting: Hematology

## 2020-06-07 ENCOUNTER — Other Ambulatory Visit: Payer: Self-pay

## 2020-06-07 DIAGNOSIS — D472 Monoclonal gammopathy: Secondary | ICD-10-CM

## 2020-06-07 DIAGNOSIS — C8310 Mantle cell lymphoma, unspecified site: Secondary | ICD-10-CM | POA: Diagnosis not present

## 2020-06-07 DIAGNOSIS — M8580 Other specified disorders of bone density and structure, unspecified site: Secondary | ICD-10-CM | POA: Diagnosis not present

## 2020-06-07 DIAGNOSIS — D479 Neoplasm of uncertain behavior of lymphoid, hematopoietic and related tissue, unspecified: Secondary | ICD-10-CM | POA: Diagnosis not present

## 2020-06-07 DIAGNOSIS — D649 Anemia, unspecified: Secondary | ICD-10-CM | POA: Diagnosis not present

## 2020-06-07 NOTE — Progress Notes (Signed)
[facility], [place] [address]   CLINIC:  Medical Oncology/Hematology  PCP:  [name], MD [place] / [place] [address]  [phone]  REASON FOR VISIT:  Follow-up for Indolent Mantle cell Lymphoma  PRIOR THERAPY: None  CURRENT THERAPY: Observation  INTERVAL HISTORY:  Lance Erickson, a 59 y.o. male, returns for routine follow-up for his Indolent Mantle cell Lymphoma. Lance Erickson was last seen on 04/05/2020.  PET scan on 04/02/2020 revealed Similar bilateral axillary adenopathy with low-level hypermetabolism. Similar isolated right inguinal low-level hypermetabolism. No progressive disease. Similar splenomegaly, without significant hypermetabolism. New T11 compression deformity and hypermetabolism, favored to be posttraumatic. Correlate with clinical history. A spinous process T2 focus of hypermetabolism is new, indeterminate..Incidental findings, including: Bilateral nephrolithiasis.Osteopoikilosis. Probable non descended right testicle..   Bone Density on 06/04/2020 revealed osteopenia -2.2 at the AP Spine.    REVIEW OF SYSTEMS:  Review of Systems  Constitutional: Negative.   HENT:  Negative.   Eyes: Negative.   Respiratory: Negative.   Cardiovascular: Negative.   Gastrointestinal: Negative.   Endocrine: Negative.   Genitourinary: Negative.    Musculoskeletal: Negative.   Skin: Negative.   Neurological: Negative.   Hematological: Negative.   Psychiatric/Behavioral: Negative.   All other systems reviewed and are negative.   PAST MEDICAL/SURGICAL HISTORY:  Past Medical History:  Diagnosis Date  . Cancer ([place]) 08/2015   skin, nose  . Leukemia ([place])   . Spinal bifida, closed    Past Surgical History:  Procedure Laterality Date  . FRACTURE SURGERY     left arm   . INGUINAL HERNIA REPAIR Right     SOCIAL HISTORY:  Social History   Socioeconomic History  . Marital status: Single    Spouse name: Not on file  .  Number of children: Not on file  . Years of education: Not on file  . Highest education level: Not on file  Occupational History  . Occupation: disabled  Tobacco Use  . Smoking status: Never Smoker  . Smokeless tobacco: Never Used  Vaping Use  . Vaping Use: Never used  Substance and Sexual Activity  . Alcohol use: No  . Drug use: No  . Sexual activity: Not Currently  Other Topics Concern  . Not on file  Social History Narrative  . Not on file   Social Determinants of Health   Financial Resource Strain:   . Difficulty of Paying Living Expenses:   Food Insecurity:   . Worried About Charity fundraiser in the Last Year:   . Arboriculturist in the Last Year:   Transportation Needs:   . Film/video editor (Medical):   Marland Kitchen Lack of Transportation (Non-Medical):   Physical Activity:   . Days of Exercise per Week:   . Minutes of Exercise per Session:   Stress:   . Feeling of Stress :   Social Connections:   . Frequency of Communication with Friends and Family:   . Frequency of Social Gatherings with Friends and Family:   . Attends Religious Services:   . Active Member of Clubs or Organizations:   . Attends Archivist Meetings:   Marland Kitchen Marital Status:   Intimate Partner Violence:   . Fear of Current or Ex-Partner:   . Emotionally Abused:   Marland Kitchen Physically Abused:   . Sexually Abused:     FAMILY HISTORY:  Family History  Problem Relation Age of Onset  . Hypertension Mother   .  Deep vein thrombosis Mother   . Hyperlipidemia Mother   . Cancer Mother        skin  . Heart attack Father   . Diabetes Brother   . Hypertension Brother   . Hyperlipidemia Brother   . Heart attack Brother   . Arthritis Brother        back pain  . Arthritis Brother        back pain  . Cancer Maternal Grandmother        varian and uterus cancer  . Heart attack Maternal Grandmother   . Alcohol abuse Maternal Grandfather   . Tuberculosis Maternal Grandfather   . Heart disease Paternal  Grandmother   . Pneumonia Paternal Grandfather     CURRENT MEDICATIONS:  No current outpatient medications on file.   No current facility-administered medications for this visit.    ALLERGIES:  No Known Allergies  PHYSICAL EXAM:  Performance status (ECOG): 1 - Symptomatic but completely ambulatory  There were no vitals filed for this visit. Wt Readings from Last 3 Encounters:  04/05/20 160 lb (72.6 kg)  11/23/19 166 lb 6.4 oz (75.5 kg)  08/09/19 163 lb 1 oz (74 kg)   Physical Exam Vitals reviewed.  Constitutional:      Appearance: Normal appearance.  HENT:     Mouth/Throat:     Mouth: Mucous membranes are moist.  Eyes:     Pupils: Pupils are equal, round, and reactive to light.  Cardiovascular:     Rate and Rhythm: Normal rate and regular rhythm.     Pulses: Normal pulses.     Heart sounds: Normal heart sounds.  Pulmonary:     Effort: Pulmonary effort is normal.     Breath sounds: Normal breath sounds.  Abdominal:     Palpations: Abdomen is soft. There is no mass.     Tenderness: There is no abdominal tenderness.  Musculoskeletal:     Right lower leg: No edema.     Left lower leg: No edema.  Lymphadenopathy:     Cervical:     Right cervical: No superficial, deep or posterior cervical adenopathy.    Left cervical: No superficial, deep or posterior cervical adenopathy.     Upper Body:     Right upper body: Axillary adenopathy present. No supraclavicular, pectoral or epitrochlear adenopathy.     Left upper body: Axillary adenopathy present. No supraclavicular, pectoral or epitrochlear adenopathy.     Lower Body: No right inguinal adenopathy. No left inguinal adenopathy.  Neurological:     Mental Status: He is alert and oriented to person, place, and time.  Psychiatric:        Mood and Affect: Mood normal.        Behavior: Behavior normal.     LABORATORY DATA:  I have reviewed the labs as listed.  CBC Latest Ref Rng & Units 06/04/2020 03/15/2020 11/16/2019  WBC  4.0 - 10.5 K/uL 43.3(H) 39.7(H) 34.0(H)  Hemoglobin 13.0 - 17.0 g/dL 9.8(L) 10.6(L) 11.6(L)  Hematocrit 39 - 52 % 33.2(L) 35.3(L) 37.3(L)  Platelets 150 - 400 K/uL 105(L) 116(L) 112(L)   CMP Latest Ref Rng & Units 06/04/2020 03/15/2020 11/16/2019  Glucose 70 - 99 mg/dL 86 96 77  BUN 6 - 20 mg/dL 23(H) 22(H) 20  Creatinine 0.61 - 1.24 mg/dL 1.06 1.09 1.20  Sodium 135 - 145 mmol/L 141 136 138  Potassium 3.5 - 5.1 mmol/L 4.3 4.8 4.6  Chloride 98 - 111 mmol/L 107 103 103  CO2 22 -  32 mmol/L 25 27 28   Calcium 8.9 - 10.3 mg/dL 9.1 9.3 9.2  Total Protein 6.5 - 8.1 g/dL 7.6 7.9 7.3  Total Bilirubin 0.3 - 1.2 mg/dL 0.8 0.7 0.7  Alkaline Phos 38 - 126 U/L 43 55 46  AST 15 - 41 U/L 19 20 19   ALT 0 - 44 U/L 11 14 14       Component Value Date/Time   RBC 3.78 (L) 06/04/2020 0855   MCV 87.8 06/04/2020 0855   MCV 85 10/08/2017 0950   MCH 25.9 (L) 06/04/2020 0855   MCHC 29.5 (L) 06/04/2020 0855   RDW 16.6 (H) 06/04/2020 0855   RDW 14.7 10/08/2017 0950   LYMPHSABS 95.0 (H) 06/04/2020 0855   LYMPHSABS 11.2 (H) 10/08/2017 0950   MONOABS 0.0 (L) 06/04/2020 0855   EOSABS 0.0 06/04/2020 0855   EOSABS 0.1 10/08/2017 0950   BASOSABS 0.0 06/04/2020 0855   BASOSABS 0.1 10/08/2017 0950   Lab Results  Component Value Date   TOTALPROTELP 7.5 06/04/2020   ALBUMINELP 3.8 06/04/2020   A1GS 0.2 06/04/2020   A2GS 0.5 06/04/2020   BETS 2.8 (H) 06/04/2020   GAMS 0.2 (L) 06/04/2020   MSPIKE 1.9 (H) 06/04/2020   SPEI Comment 06/04/2020    Lab Results  Component Value Date   KPAFRELGTCHN 367.6 (H) 03/15/2020   LAMBDASER 3.8 (L) 03/15/2020   KAPLAMBRATIO 96.74 (H) 03/15/2020    DIAGNOSTIC IMAGING:  I have independently reviewed the scans and discussed with the patient. DG Bone Density  Result Date: 06/04/2020 EXAM: DUAL X-RAY ABSORPTIOMETRY (DXA) FOR BONE MINERAL DENSITY IMPRESSION: Your patient Lance Erickson completed a BMD test on 06/04/2020 using the [place] (software version: 14.10)  manufactured by [employer]. The following summarizes the results of our evaluation. Technologist::TNB PATIENT BIOGRAPHICAL: Name: Augustino, Savastano Patient ID: [id number] Birth Date: 05/13/1961 Height: 69.0 in. Gender: Male Exam Date: 06/04/2020 Weight: 160.0 lbs. Indications: Caucasian, Height Loss, History of Fracture (Adult), Low Calcium Intake Fractures: Forearm Treatments: Multivitamin DENSITOMETRY RESULTS: Site      Region      Measured Date Measured Age WHO Classification Young Adult T-score BMD         %Change vs. Previous Significant Change (*) AP Spine L1-L4 06/04/2020 59.0 N/A -2.2 0.956 g/cm2 DualFemur Total Right 06/04/2020 58.0 N/A 0.1 1.113 g/cm2 ASSESSMENT: BMD as determined from AP Spine L1-L4 is 0.956 g/cm2 with aT-score of -2.2. This patient is considered osteopenic according to [place] [facility]) criteria. The scan quality is good. World Pharmacologist [facility]) criteria for post-menopausal, Caucasian Women: Normal:       T-score at or above -1 SD Osteopenia:   T-score between -1 and -2.5 SD Osteoporosis: T-score at or below -2.5 SD RECOMMENDATIONS: 1. All patients should optimize calcium and vitamin D intake. 2. Consider FDA-approved medical therapies in postmenopausal women and med aged 31 years and older, based on the following: a. A hip or vertebral (clinical or morphometric) fracture b. T-score < -2.5 at the femoral neck or spine after appropriate evaluation to exclude secondary causes c. Low bone mass (T-score between -1.0 and -2.5 at the femoral neck or spine) and a 10-year probability of a hip fracture > 3% or a 10-year probability of a major osteoporosis-related fracture > 20% based on the [place]-adapted WHO algorithm d. Clinician judgment and/or patient preferences may indicate treatment for people with 10-year fracture probabilities above or below these levels FOLLOW-UP: People with diagnosed cases of osteoporosis or osteopenia should  be regularly tested for bone  mineral density. For patients eligible for Medicare, routine testing is allowed once every 2 years. Testing frequency can be increased for patients who have rapidly progressing disease, or for those who are receiving medical therapy to restore bone mass. I have reviewed this report, and agree with the above findings. [facility] Your patient Lance Erickson completed a FRAX assessment on 06/04/2020 using the [place] (analysis version: 14.10) manufactured by [employer]. The following summarizes the results of our evaluation. PATIENT BIOGRAPHICAL: Name: Lance Erickson, Lance Erickson Patient ID: [id number] Birth Date: 1961/01/17 Height:    69.0 in. Gender:     Male      [age]       Weight:    160.0 lbs. Ethnicity:  White                            Exam Date: 06/04/2020 FRAX* RESULTS:  (version: 3.5) 10-year Probability of Fracture1 Major Osteoporotic Fracture2 Hip Fracture 6.5% 0.1% Population: [place] (Caucasian) Risk Factors: History of Fracture (Adult) Based on Femur (Left) Neck BMD 1 -The 10-year probability of fracture may be lower than reported if the patient has received treatment. 2 -Major Osteoporotic Fracture: Clinical Spine, Forearm, Hip or Shoulder *FRAX is a Materials engineer of the [employer] for Metabolic Bone Disease, a [place] (WHO) [facility]. ASSESSMENT: The probability of a major osteoporotic fracture is 6.5% within the next ten years. The probability of a hip fracture is 0.1% within the next ten years. Electronically Signed   By: Rolm Baptise M.D.   On: 06/04/2020 11:46     ASSESSMENT:  1.  Indolent mantle cell lymphoma: -BMBX on 12/03/2017 showed hypercellular marrow with involvement by NHL.  Bone marrow is predominantly involved by small lymphoid cells expressing pan B cell antigens including CD20, CD5 and cyclin D1 expression.  Chromosome analysis shows 82, XY.  CLL FISH panel was negative.  Minor population of  monoclonal, kappa restricted plasma cells are present 2% of cells. -Labs on 03/15/2020 shows M spike of 1.5 g.  White count is 39.7.  Platelet count is 116.  Hemoglobin is 10.6. -PET scan on 04/02/2020 shows left axillary lymphadenopathy, SUV max 2.6.  No mediastinal or hilar adenopathy.  Right inguinal adenopathy with SUV 1.6.  No splenic hypermetabolism.  Splenomegaly measuring 15.2 cm.  New mild compression deformity at T11 with hypermetabolism, SUV 4.1.  There is also new hypermetabolic within the spinous process of T2, SUV 3.8.  2.  Osteopenia: -Bone density on 06/04/2020 showed T score of -2.2.   PLAN:  1. Indolent mantle cell lymphoma: -He does not have any fevers, night sweats or weight loss. -I have reviewed his white count which is 43.3.  Platelet count is 105.  Hemoglobin dropped to 9.8.  M spike was 1.9 g.  LFTs were normal. -We will continue to closely monitor for indications for treatment.  2.  Bone lesions: -Previous PET scan showed T11 compression deformity. -His vitamin D level today is 58.83.  We will closely monitor. -He was told to take calcium 1200 mg daily.  Will consider Prolia as his T score was -2.2 on bone density on 06/04/2020.  3.  Normocytic anemia: -Hemoglobin today decreased to 9.8.  Ferritin was low at 4 with percent saturation of 11.  J62 and folic acid was normal. -I plan to  give her weekly Feraheme x2.  We have discussed side effects including anaphylactic reactions. -I will plan to repeat his CBC in 6 weeks.  If there is no improvement in anemia, will consider the possibility of treatment for his lymphoma.   Orders placed this encounter:  No orders of the defined types were placed in this encounter.    Derek Jack, MD [facility] [phone]   I, Jacqualyn Posey, am acting as a scribe for Dr. Sanda Linger.  I, Derek Jack MD, have reviewed the above documentation for accuracy and completeness, and I agree with the  above.

## 2020-06-07 NOTE — Patient Instructions (Addendum)
[place] at [facility] Discharge Instructions  You were seen today by Dr. Delton Coombes. He went over your recent results. Please start to take 1200 mg of Calcium daily; you can get this from any pharmacy over the counter. You will be getting two infusions of iron to help bring your iron levels up. We will also start giving you Prolia to help build your bone density due to your recent osteopenia diagnosis with a bone fracture. Dr. Delton Coombes will see you back in 6 weeks for labs and follow up.    Thank you for choosing [place] at [facility] to provide your oncology and hematology care.  To afford each patient quality time with our provider, please arrive at least 15 minutes before your scheduled appointment time.   If you have a lab appointment with the [facility] please come in thru the Main Entrance and check in at the main information desk  You need to re-schedule your appointment should you arrive 10 or more minutes late.  We strive to give you quality time with our providers, and arriving late affects you and other patients whose appointments are after yours.  Also, if you no show three or more times for appointments you may be dismissed from the clinic at the providers discretion.     Again, thank you for choosing [facility].  Our hope is that these requests will decrease the amount of time that you wait before being seen by our physicians.       _____________________________________________________________  Should you have questions after your visit to [facility], please contact our office at [phone] between the hours of 8:00 a.m. and 4:30 p.m.  Voicemails left after 4:00 p.m. will not be returned until the following business day.  For prescription refill requests, have your pharmacy contact our office and allow 72 hours.    Cancer Center Support Programs:   > Cancer Support Group  2nd Tuesday of the  month 1pm-2pm, Journey Room

## 2020-06-08 LAB — KAPPA/LAMBDA LIGHT CHAINS
Kappa free light chain: 434 mg/L — ABNORMAL HIGH (ref 3.3–19.4)
Kappa free light chain: 540.2 mg/L — ABNORMAL HIGH (ref 3.3–19.4)
Kappa, lambda light chain ratio: 114.21 — ABNORMAL HIGH (ref 0.26–1.65)
Kappa, lambda light chain ratio: 142.16 — ABNORMAL HIGH (ref 0.26–1.65)
Lambda free light chains: 3.8 mg/L — ABNORMAL LOW (ref 5.7–26.3)
Lambda free light chains: 3.8 mg/L — ABNORMAL LOW (ref 5.7–26.3)

## 2020-06-11 ENCOUNTER — Other Ambulatory Visit (HOSPITAL_COMMUNITY): Payer: Self-pay | Admitting: Hematology

## 2020-06-11 ENCOUNTER — Inpatient Hospital Stay (HOSPITAL_COMMUNITY): Payer: Medicare Other | Attending: Hematology

## 2020-06-11 VITALS — BP 126/82 | HR 66 | Temp 97.7°F | Resp 18

## 2020-06-11 DIAGNOSIS — C8319 Mantle cell lymphoma, extranodal and solid organ sites: Secondary | ICD-10-CM | POA: Diagnosis not present

## 2020-06-11 DIAGNOSIS — D649 Anemia, unspecified: Secondary | ICD-10-CM | POA: Insufficient documentation

## 2020-06-11 DIAGNOSIS — M8580 Other specified disorders of bone density and structure, unspecified site: Secondary | ICD-10-CM | POA: Insufficient documentation

## 2020-06-11 DIAGNOSIS — D509 Iron deficiency anemia, unspecified: Secondary | ICD-10-CM

## 2020-06-11 MED ORDER — SODIUM CHLORIDE 0.9 % IV SOLN
510.0000 mg | Freq: Once | INTRAVENOUS | Status: AC
  Administered 2020-06-11: 510 mg via INTRAVENOUS
  Filled 2020-06-11: qty 510

## 2020-06-11 MED ORDER — LORATADINE 10 MG PO TABS
10.0000 mg | ORAL_TABLET | Freq: Once | ORAL | Status: AC
  Administered 2020-06-11: 10 mg via ORAL
  Filled 2020-06-11: qty 1

## 2020-06-11 MED ORDER — SODIUM CHLORIDE 0.9 % IV SOLN: Freq: Once | INTRAVENOUS | Status: AC

## 2020-06-11 MED ORDER — ACETAMINOPHEN 325 MG PO TABS
650.0000 mg | ORAL_TABLET | Freq: Once | ORAL | Status: AC
  Administered 2020-06-11: 650 mg via ORAL
  Filled 2020-06-11: qty 2

## 2020-06-11 MED ORDER — FAMOTIDINE 20 MG PO TABS
20.0000 mg | ORAL_TABLET | Freq: Once | ORAL | Status: AC
  Administered 2020-06-11: 20 mg via ORAL
  Filled 2020-06-11: qty 1

## 2020-06-11 NOTE — Patient Instructions (Signed)
[facility] at [facility]  Discharge Instructions:   _______________________________________________________________  Thank you for choosing [facility] at [facility] to provide your oncology and hematology care.  To afford each patient quality time with our providers, please arrive at least 15 minutes before your scheduled appointment.  You need to re-schedule your appointment if you arrive 10 or more minutes late.  We strive to give you quality time with our providers, and arriving late affects you and other patients whose appointments are after yours.  Also, if you no show three or more times for appointments you may be dismissed from the clinic.  Again, thank you for choosing [facility] at [facility]. Our hope is that these requests will allow you access to exceptional care and in a timely manner. _______________________________________________________________  If you have questions after your visit, please contact our office at [phone] between the hours of 8:30 a.m. and 5:00 p.m. Voicemails left after 4:30 p.m. will not be returned until the following business day. _______________________________________________________________  For prescription refill requests, have your pharmacy contact our office. _______________________________________________________________  Recommendations made by the consultant and any test results will be sent to your referring physician. _______________________________________________________________ 

## 2020-06-11 NOTE — Progress Notes (Signed)
Patient presents today for Feraheme infusion. Vital signs stable. Patient denies pain today. Patient has no complaints of any changes since his last visit.   Feraheme given today per MD orders. Tolerated infusion without adverse affects. Vital signs stable. No complaints at this time. Discharged from clinic ambulatory. F/U with [facility] as scheduled.

## 2020-06-18 ENCOUNTER — Inpatient Hospital Stay (HOSPITAL_COMMUNITY): Payer: Medicare Other

## 2020-06-18 ENCOUNTER — Encounter (HOSPITAL_COMMUNITY): Payer: Self-pay

## 2020-06-18 VITALS — BP 122/61 | HR 57 | Temp 97.5°F | Resp 16

## 2020-06-18 DIAGNOSIS — D649 Anemia, unspecified: Secondary | ICD-10-CM | POA: Diagnosis not present

## 2020-06-18 DIAGNOSIS — D509 Iron deficiency anemia, unspecified: Secondary | ICD-10-CM

## 2020-06-18 DIAGNOSIS — C8319 Mantle cell lymphoma, extranodal and solid organ sites: Secondary | ICD-10-CM | POA: Diagnosis not present

## 2020-06-18 DIAGNOSIS — M8580 Other specified disorders of bone density and structure, unspecified site: Secondary | ICD-10-CM | POA: Diagnosis not present

## 2020-06-18 MED ORDER — SODIUM CHLORIDE 0.9 % IV SOLN
510.0000 mg | Freq: Once | INTRAVENOUS | Status: AC
  Administered 2020-06-18: 510 mg via INTRAVENOUS
  Filled 2020-06-18: qty 510

## 2020-06-18 MED ORDER — LORATADINE 10 MG PO TABS
10.0000 mg | ORAL_TABLET | Freq: Once | ORAL | Status: AC
  Administered 2020-06-18: 10 mg via ORAL
  Filled 2020-06-18: qty 1

## 2020-06-18 MED ORDER — SODIUM CHLORIDE 0.9 % IV SOLN: Freq: Once | INTRAVENOUS | Status: AC

## 2020-06-18 MED ORDER — FAMOTIDINE 20 MG PO TABS
20.0000 mg | ORAL_TABLET | Freq: Once | ORAL | Status: AC
  Administered 2020-06-18: 20 mg via ORAL
  Filled 2020-06-18: qty 1

## 2020-06-18 MED ORDER — ACETAMINOPHEN 325 MG PO TABS
650.0000 mg | ORAL_TABLET | Freq: Once | ORAL | Status: AC
  Administered 2020-06-18: 650 mg via ORAL
  Filled 2020-06-18: qty 2

## 2020-06-18 NOTE — Patient Instructions (Signed)
[facility] at [facility]  Discharge Instructions:   _______________________________________________________________  Thank you for choosing [facility] at [facility] to provide your oncology and hematology care.  To afford each patient quality time with our providers, please arrive at least 15 minutes before your scheduled appointment.  You need to re-schedule your appointment if you arrive 10 or more minutes late.  We strive to give you quality time with our providers, and arriving late affects you and other patients whose appointments are after yours.  Also, if you no show three or more times for appointments you may be dismissed from the clinic.  Again, thank you for choosing [facility] at [facility]. Our hope is that these requests will allow you access to exceptional care and in a timely manner. _______________________________________________________________  If you have questions after your visit, please contact our office at [phone] between the hours of 8:30 a.m. and 5:00 p.m. Voicemails left after 4:30 p.m. will not be returned until the following business day. _______________________________________________________________  For prescription refill requests, have your pharmacy contact our office. _______________________________________________________________  Recommendations made by the consultant and any test results will be sent to your referring physician. _______________________________________________________________ 

## 2020-06-18 NOTE — Progress Notes (Signed)
Lance Erickson tolerated iron infusion well today without incidence.  Discharged ambulatory.  Vital signs stable prior to discharge.

## 2020-07-24 ENCOUNTER — Inpatient Hospital Stay (HOSPITAL_COMMUNITY): Payer: Medicare Other

## 2020-07-24 ENCOUNTER — Encounter (HOSPITAL_COMMUNITY): Payer: Self-pay | Admitting: Hematology

## 2020-07-24 ENCOUNTER — Inpatient Hospital Stay (HOSPITAL_COMMUNITY): Payer: Medicare Other | Attending: Hematology | Admitting: Hematology

## 2020-07-24 ENCOUNTER — Other Ambulatory Visit: Payer: Self-pay

## 2020-07-24 VITALS — BP 132/70 | HR 65 | Temp 97.3°F | Resp 18 | Wt 152.9 lb

## 2020-07-24 DIAGNOSIS — Z8572 Personal history of non-Hodgkin lymphomas: Secondary | ICD-10-CM | POA: Diagnosis not present

## 2020-07-24 DIAGNOSIS — M8588 Other specified disorders of bone density and structure, other site: Secondary | ICD-10-CM | POA: Diagnosis not present

## 2020-07-24 DIAGNOSIS — M8580 Other specified disorders of bone density and structure, unspecified site: Secondary | ICD-10-CM | POA: Diagnosis not present

## 2020-07-24 DIAGNOSIS — C8310 Mantle cell lymphoma, unspecified site: Secondary | ICD-10-CM | POA: Diagnosis not present

## 2020-07-24 DIAGNOSIS — D649 Anemia, unspecified: Secondary | ICD-10-CM | POA: Insufficient documentation

## 2020-07-24 DIAGNOSIS — D509 Iron deficiency anemia, unspecified: Secondary | ICD-10-CM

## 2020-07-24 DIAGNOSIS — D479 Neoplasm of uncertain behavior of lymphoid, hematopoietic and related tissue, unspecified: Secondary | ICD-10-CM

## 2020-07-24 DIAGNOSIS — D472 Monoclonal gammopathy: Secondary | ICD-10-CM

## 2020-07-24 LAB — COMPREHENSIVE METABOLIC PANEL
ALT: 12 U/L (ref 0–44)
AST: 20 U/L (ref 15–41)
Albumin: 3.8 g/dL (ref 3.5–5.0)
Alkaline Phosphatase: 50 U/L (ref 38–126)
Anion gap: 9 (ref 5–15)
BUN: 22 mg/dL — ABNORMAL HIGH (ref 6–20)
CO2: 27 mmol/L (ref 22–32)
Calcium: 9.3 mg/dL (ref 8.9–10.3)
Chloride: 102 mmol/L (ref 98–111)
Creatinine, Ser: 1.13 mg/dL (ref 0.61–1.24)
GFR calc Af Amer: >60 mL/min (ref >60)
GFR calc non Af Amer: >60 mL/min (ref >60)
Glucose, Bld: 94 mg/dL (ref 70–99)
Potassium: 4.6 mmol/L (ref 3.5–5.1)
Sodium: 138 mmol/L (ref 135–145)
Total Bilirubin: 0.7 mg/dL (ref 0.3–1.2)
Total Protein: 8.1 g/dL (ref 6.5–8.1)

## 2020-07-24 LAB — CBC WITH DIFFERENTIAL/PLATELET
Abs Immature Granulocytes: 0.12 10*3/uL — ABNORMAL HIGH (ref 0.00–0.07)
Basophils Absolute: 0.3 10*3/uL — ABNORMAL HIGH (ref 0.0–0.1)
Basophils Relative: 1 %
Eosinophils Absolute: 0.3 10*3/uL (ref 0.0–0.5)
Eosinophils Relative: 1 %
HCT: 37.9 % — ABNORMAL LOW (ref 39.0–52.0)
Hemoglobin: 11.5 g/dL — ABNORMAL LOW (ref 13.0–17.0)
Immature Granulocytes: 0 %
Lymphocytes Relative: 90 %
Lymphs Abs: 45.2 10*3/uL — ABNORMAL HIGH (ref 0.7–4.0)
MCH: 27.6 pg (ref 26.0–34.0)
MCHC: 30.3 g/dL (ref 30.0–36.0)
MCV: 91.1 fL (ref 80.0–100.0)
Monocytes Absolute: 0.7 10*3/uL (ref 0.1–1.0)
Monocytes Relative: 1 %
Neutro Abs: 3.4 10*3/uL (ref 1.7–7.7)
Neutrophils Relative %: 7 %
Platelets: 108 10*3/uL — ABNORMAL LOW (ref 150–400)
RBC: 4.16 MIL/uL — ABNORMAL LOW (ref 4.22–5.81)
RDW: 18.4 % — ABNORMAL HIGH (ref 11.5–15.5)
WBC: 50 10*3/uL — ABNORMAL HIGH (ref 4.0–10.5)
nRBC: 0 % (ref 0.0–0.2)

## 2020-07-24 LAB — IRON AND TIBC
Iron: 57 ug/dL (ref 45–182)
Saturation Ratios: 19 % (ref 17.9–39.5)
TIBC: 308 ug/dL (ref 250–450)
UIBC: 251 ug/dL

## 2020-07-24 LAB — FERRITIN: Ferritin: 42 ng/mL (ref 24–336)

## 2020-07-24 MED ORDER — DENOSUMAB 60 MG/ML ~~LOC~~ SOSY
60.0000 mg | PREFILLED_SYRINGE | Freq: Once | SUBCUTANEOUS | Status: AC
  Administered 2020-07-24: 60 mg via SUBCUTANEOUS
  Filled 2020-07-24: qty 1

## 2020-07-24 NOTE — Patient Instructions (Addendum)
[place] at [facility] Discharge Instructions  You were seen today by Dr. Delton Coombes. He went over your recent results. You received your injection today; you will continue receiving this injection every 6 months. Call the office immediately if you develop pain in your jaw or your jawbone becomes visible through the gums. Dr. Delton Coombes will see you back for labs and follow up.   Thank you for choosing [place] at [facility] to provide your oncology and hematology care.  To afford each patient quality time with our provider, please arrive at least 15 minutes before your scheduled appointment time.   If you have a lab appointment with the [facility] please come in thru the Main Entrance and check in at the main information desk  You need to re-schedule your appointment should you arrive 10 or more minutes late.  We strive to give you quality time with our providers, and arriving late affects you and other patients whose appointments are after yours.  Also, if you no show three or more times for appointments you may be dismissed from the clinic at the providers discretion.     Again, thank you for choosing [facility].  Our hope is that these requests will decrease the amount of time that you wait before being seen by our physicians.       _____________________________________________________________  Should you have questions after your visit to [facility], please contact our office at [phone] between the hours of 8:00 a.m. and 4:30 p.m.  Voicemails left after 4:00 p.m. will not be returned until the following business day.  For prescription refill requests, have your pharmacy contact our office and allow 72 hours.    Cancer Center Support Programs:   > Cancer Support Group  2nd Tuesday of the month 1pm-2pm, Journey Room

## 2020-07-24 NOTE — Patient Instructions (Signed)
[place] at [facility] Discharge Instructions  [place] at [facility] Discharge Instructions  Received Prolia injection today. Follow-up as scheduled   Thank you for choosing [place] at [facility] to provide your oncology and hematology care.  To afford each patient quality time with our provider, please arrive at least 15 minutes before your scheduled appointment time.   If you have a lab appointment with the [facility] please come in thru the Main Entrance and check in at the main information desk.  You need to re-schedule your appointment should you arrive 10 or more minutes late.  We strive to give you quality time with our providers, and arriving late affects you and other patients whose appointments are after yours.  Also, if you no show three or more times for appointments you may be dismissed from the clinic at the providers discretion.     Again, thank you for choosing [facility].  Our hope is that these requests will decrease the amount of time that you wait before being seen by our physicians.       _____________________________________________________________  Should you have questions after your visit to [facility], please contact our office at [phone] and follow the prompts.  Our office hours are 8:00 a.m. and 4:30 p.m. Monday - Friday.  Please note that voicemails left after 4:00 p.m. may not be returned until the following business day.  We are closed weekends and major holidays.  You do have access to a nurse 24-7, just call the main number to the clinic [phone] and do not press any options, hold on the line and a nurse will answer the phone.    For prescription refill requests, have your pharmacy contact our office and allow 72 hours.    Due to Covid, you will need to wear a mask upon entering the hospital. If you do not have a mask, a mask will be given to  you at the Main Entrance upon arrival. For doctor visits, patients may have 1 support person age 63 or older with them. For treatment visits, patients can not have anyone with them due to social distancing guidelines and our immunocompromised population.       Thank you for choosing [place] at [facility] to provide your oncology and hematology care.  To afford each patient quality time with our provider, please arrive at least 15 minutes before your scheduled appointment time.   If you have a lab appointment with the [place] please come in thru the Main Entrance and check in at the main information desk.  You need to re-schedule your appointment should you arrive 10 or more minutes late.  We strive to give you quality time with our providers, and arriving late affects you and other patients whose appointments are after yours.  Also, if you no show three or more times for appointments you may be dismissed from the clinic at the providers discretion.     Again, thank you for choosing [facility].  Our hope is that these requests will decrease the amount of time that you wait before being seen by our physicians.       _____________________________________________________________  Should you have questions after your visit to [facility], please contact our office at [phone] and follow the prompts.  Our office hours are 8:00 a.m. and 4:30 p.m.  Monday - Friday.  Please note that voicemails left after 4:00 p.m. may not be returned until the following business day.  We are closed weekends and major holidays.  You do have access to a nurse 24-7, just call the main number to the clinic [phone] and do not press any options, hold on the line and a nurse will answer the phone.    For prescription refill requests, have your pharmacy contact our office and allow 72 hours.    Due to Covid, you will need to wear a mask upon entering the  hospital. If you do not have a mask, a mask will be given to you at the Main Entrance upon arrival. For doctor visits, patients may have 1 support person age 70 or older with them. For treatment visits, patients can not have anyone with them due to social distancing guidelines and our immunocompromised population.

## 2020-07-24 NOTE — Progress Notes (Signed)
[name] reviewed with and pt seen by Dr. Delton Coombes and pt approved for Prolia injection today per MD                                      Lance Erickson tolerated Prolia injection well without complaints or incident.Calcium 9.3 today and pt denied any tooth or jaw pain and no recent or future dental visits prior to administering this medication. Reviewed purpose and side effects of the Prolia with written information given to the pt who verbalized understanding. Pt discharged self ambulatory in satisfactory condition

## 2020-07-24 NOTE — Progress Notes (Signed)
Patient was assessed by Dr. Delton Coombes and labs have been reviewed.  Calcium is 9.3, patient is okay to proceed with injection today. Primary RN and pharmacy aware.

## 2020-07-24 NOTE — Progress Notes (Signed)
[place], [place] [address]   CLINIC:  Medical Oncology/Hematology  PCP:  [name], MD [address] [place] [address]  [phone]  REASON FOR VISIT:  Follow-up for indolent mantle cell lymphoma  PRIOR THERAPY: None  CURRENT THERAPY: Observation  INTERVAL HISTORY:  [name], a 59 y.o. male, returns for routine follow-up for his indolent mantle cell lymphoma. [place] was last seen on 06/07/2020.  Today he reports feeling good. He denies any new adenopathy and his energy levels and appetite are great. He reports that he felt better with improved energy when he received 2 iron infusions. He denies having any dental issues and he continues taking calcium BID.    REVIEW OF SYSTEMS:  Review of Systems  Constitutional: Negative for appetite change and fatigue.  Musculoskeletal: Negative for arthralgias.  Hematological: Negative for adenopathy.  All other systems reviewed and are negative.   PAST MEDICAL/SURGICAL HISTORY:  Past Medical History:  Diagnosis Date  . Cancer ([place]) 08/2015   skin, nose  . Leukemia ([place])   . Spinal bifida, closed    Past Surgical History:  Procedure Laterality Date  . FRACTURE SURGERY     left arm   . INGUINAL HERNIA REPAIR Right     SOCIAL HISTORY:  Social History   Socioeconomic History  . Marital status: Single    Spouse name: Not on file  . Number of children: Not on file  . Years of education: Not on file  . Highest education level: Not on file  Occupational History  . Occupation: disabled  Tobacco Use  . Smoking status: Never Smoker  . Smokeless tobacco: Never Used  Vaping Use  . Vaping Use: Never used  Substance and Sexual Activity  . Alcohol use: No  . Drug use: No  . Sexual activity: Not Currently  Other Topics Concern  . Not on file  Social History Narrative  . Not on file   Social Determinants of Health   Financial Resource Strain:   . Difficulty of  Paying Living Expenses: Not on file  Food Insecurity:   . Worried About Charity fundraiser in the Last Year: Not on file  . Ran Out of Food in the Last Year: Not on file  Transportation Needs:   . Lack of Transportation (Medical): Not on file  . Lack of Transportation (Non-Medical): Not on file  Physical Activity:   . Days of Exercise per Week: Not on file  . Minutes of Exercise per Session: Not on file  Stress:   . Feeling of Stress : Not on file  Social Connections:   . Frequency of Communication with Friends and Family: Not on file  . Frequency of Social Gatherings with Friends and Family: Not on file  . Attends Religious Services: Not on file  . Active Member of Clubs or Organizations: Not on file  . Attends Archivist Meetings: Not on file  . Marital Status: Not on file  Intimate Partner Violence:   . Fear of Current or Ex-Partner: Not on file  . Emotionally Abused: Not on file  . Physically Abused: Not on file  . Sexually Abused: Not on file    FAMILY HISTORY:  Family History  Problem Relation Age of Onset  . Hypertension Mother   . Deep vein thrombosis Mother   . Hyperlipidemia Mother   . Cancer Mother        skin  .  Heart attack Father   . Diabetes Brother   . Hypertension Brother   . Hyperlipidemia Brother   . Heart attack Brother   . Arthritis Brother        back pain  . Arthritis Brother        back pain  . Cancer Maternal Grandmother        varian and uterus cancer  . Heart attack Maternal Grandmother   . Alcohol abuse Maternal Grandfather   . Tuberculosis Maternal Grandfather   . Heart disease Paternal Grandmother   . Pneumonia Paternal Grandfather     CURRENT MEDICATIONS:  No current outpatient medications on file.   No current facility-administered medications for this visit.    ALLERGIES:  No Known Allergies  PHYSICAL EXAM:  Performance status (ECOG): 1 - Symptomatic but completely ambulatory  Vitals:   07/24/20 1140  BP:  132/70  Pulse: 65  Resp: 18  Temp: (!) 97.3 F (36.3 C)  SpO2: 96%   Wt Readings from Last 3 Encounters:  07/24/20 152 lb 14.4 oz (69.4 kg)  04/05/20 160 lb (72.6 kg)  11/23/19 166 lb 6.4 oz (75.5 kg)   Physical Exam Vitals reviewed.  Constitutional:      Appearance: Normal appearance.  Cardiovascular:     Rate and Rhythm: Normal rate and regular rhythm.     Pulses: Normal pulses.     Heart sounds: Normal heart sounds.  Pulmonary:     Effort: Pulmonary effort is normal.     Breath sounds: Normal breath sounds.  Abdominal:     Palpations: Abdomen is soft. There is no hepatomegaly, splenomegaly or mass.     Tenderness: There is no abdominal tenderness.     Hernia: No hernia is present.  Musculoskeletal:     Right lower leg: No edema.     Left lower leg: No edema.  Lymphadenopathy:     Upper Body:     Right upper body: Axillary adenopathy present.     Left upper body: Axillary adenopathy present.     Lower Body: No right inguinal adenopathy. No left inguinal adenopathy.  Neurological:     General: No focal deficit present.     Mental Status: He is alert and oriented to person, place, and time.  Psychiatric:        Mood and Affect: Mood normal.        Behavior: Behavior normal.     LABORATORY DATA:  I have reviewed the labs as listed.  CBC Latest Ref Rng & Units 07/24/2020 06/04/2020 03/15/2020  WBC 4.0 - 10.5 K/uL 50.0(H) 43.3(H) 39.7(H)  Hemoglobin 13.0 - 17.0 g/dL 11.5(L) 9.8(L) 10.6(L)  Hematocrit 39 - 52 % 37.9(L) 33.2(L) 35.3(L)  Platelets 150 - 400 K/uL 108(L) 105(L) 116(L)   CMP Latest Ref Rng & Units 07/24/2020 06/04/2020 03/15/2020  Glucose 70 - 99 mg/dL 94 86 96  BUN 6 - 20 mg/dL 22(H) 23(H) 22(H)  Creatinine 0.61 - 1.24 mg/dL 1.13 1.06 1.09  Sodium 135 - 145 mmol/L 138 141 136  Potassium 3.5 - 5.1 mmol/L 4.6 4.3 4.8  Chloride 98 - 111 mmol/L 102 107 103  CO2 22 - 32 mmol/L 27 25 27   Calcium 8.9 - 10.3 mg/dL 9.3 9.1 9.3  Total Protein 6.5 - 8.1 g/dL 8.1 7.6  7.9  Total Bilirubin 0.3 - 1.2 mg/dL 0.7 0.8 0.7  Alkaline Phos 38 - 126 U/L 50 43 55  AST 15 - 41 U/L 20 19 20   ALT 0 -  44 U/L 12 11 14       Component Value Date/Time   RBC 4.16 (L) 07/24/2020 1114   MCV 91.1 07/24/2020 1114   MCV 85 10/08/2017 0950   MCH 27.6 07/24/2020 1114   MCHC 30.3 07/24/2020 1114   RDW 18.4 (H) 07/24/2020 1114   RDW 14.7 10/08/2017 0950   LYMPHSABS 45.2 (H) 07/24/2020 1114   LYMPHSABS 11.2 (H) 10/08/2017 0950   MONOABS 0.7 07/24/2020 1114   EOSABS 0.3 07/24/2020 1114   EOSABS 0.1 10/08/2017 0950   BASOSABS 0.3 (H) 07/24/2020 1114   BASOSABS 0.1 10/08/2017 0950   Lab Results  Component Value Date   LDH 102 06/04/2020   LDH 109 03/15/2020   LDH 103 11/16/2019   Lab Results  Component Value Date   TIBC 308 07/24/2020   TIBC 428 06/04/2020   FERRITIN 42 07/24/2020   FERRITIN 4 (L) 06/04/2020   IRONPCTSAT 19 07/24/2020   IRONPCTSAT 11 (L) 06/04/2020   Lab Results  Component Value Date   TOTALPROTELP 7.5 06/04/2020   ALBUMINELP 3.8 06/04/2020   A1GS 0.2 06/04/2020   A2GS 0.5 06/04/2020   BETS 2.8 (H) 06/04/2020   GAMS 0.2 (L) 06/04/2020   MSPIKE 1.9 (H) 06/04/2020   SPEI Comment 06/04/2020    Lab Results  Component Value Date   KPAFRELGTCHN 540.2 (H) 06/07/2020   LAMBDASER 3.8 (L) 06/07/2020   KAPLAMBRATIO 142.16 (H) 06/07/2020    DIAGNOSTIC IMAGING:  I have independently reviewed the scans and discussed with the patient. No results found.   ASSESSMENT:  1. Indolent mantle cell lymphoma: -BMBX on 12/03/2017 showed hypercellular marrow with involvement by NHL. Bone marrow is predominantly involved by small lymphoid cells expressing pan B cell antigens including CD20, CD5 and cyclin D1 expression. Chromosome analysis shows 13, XY. CLL FISH panel was negative. Minor population of monoclonal, kappa restricted plasma cells are present 2% of cells. -Labs on 03/15/2020 shows M spike of 1.5 g. White count is 39.7. Platelet count is 116.  Hemoglobin is 10.6. -PET scan on 04/02/2020 shows left axillary lymphadenopathy, SUV max 2.6. No mediastinal or hilar adenopathy. Right inguinal adenopathy with SUV 1.6. No splenic hypermetabolism. Splenomegaly measuring 15.2 cm. New mild compression deformity at T11 with hypermetabolism, SUV 4.1. There is also new hypermetabolic within the spinous process of T2, SUV 3.8.  2.  Osteopenia: -Bone density on 06/04/2020 showed T score of -2.2.   PLAN:  1.Indolent mantle cell lymphoma: -Does not report any B symptoms.  No recurrent infections. -CBC today shows white count is 50.  This is up from 43 last time.  Platelets are 108.  Hemoglobin improved 11.5. -Bilateral axillary adenopathy and right inguinal adenopathy stable. -No indication for treatment at this time.  RTC in 4 months with repeat labs.  2. Bone lesions: -PET scan showed T11 compression deformity.  Vitamin D was normal.  Bone density test showed osteopenia. -Recommended Prolia every 6 months.  We discussed side effects including rare chance of ONJ.  Patient does not have a dentist.  However he does not have any dental issues at this time. -He will proceed with first dose of Prolia today.  3.  Normocytic anemia: -Received Feraheme on 06/11/2020 and 06/18/2020. -Energy levels improved greatly.  Hemoglobin improved from 9.8-11.5. -Ferritin is 42 and percent saturation is 19.  Orders placed this encounter:  No orders of the defined types were placed in this encounter.    Derek Jack, MD [place] [phone]   I, Quillian Quince  [place], am acting as a scribe for Dr. Sanda Linger.  I, Derek Jack MD, have reviewed the above documentation for accuracy and completeness, and I agree with the above.

## 2020-11-21 ENCOUNTER — Other Ambulatory Visit (HOSPITAL_COMMUNITY): Payer: Self-pay | Admitting: *Deleted

## 2020-11-21 DIAGNOSIS — C8310 Mantle cell lymphoma, unspecified site: Secondary | ICD-10-CM

## 2020-11-22 ENCOUNTER — Encounter (HOSPITAL_COMMUNITY): Payer: Self-pay | Admitting: *Deleted

## 2020-11-22 ENCOUNTER — Other Ambulatory Visit: Payer: Self-pay

## 2020-11-22 ENCOUNTER — Inpatient Hospital Stay (HOSPITAL_COMMUNITY): Payer: Medicare Other | Attending: Hematology

## 2020-11-22 DIAGNOSIS — M8580 Other specified disorders of bone density and structure, unspecified site: Secondary | ICD-10-CM | POA: Insufficient documentation

## 2020-11-22 DIAGNOSIS — D509 Iron deficiency anemia, unspecified: Secondary | ICD-10-CM | POA: Diagnosis not present

## 2020-11-22 DIAGNOSIS — C8310 Mantle cell lymphoma, unspecified site: Secondary | ICD-10-CM

## 2020-11-22 DIAGNOSIS — C8314 Mantle cell lymphoma, lymph nodes of axilla and upper limb: Secondary | ICD-10-CM | POA: Diagnosis not present

## 2020-11-22 LAB — COMPREHENSIVE METABOLIC PANEL
ALT: 14 U/L (ref 0–44)
AST: 25 U/L (ref 15–41)
Albumin: 3.5 g/dL (ref 3.5–5.0)
Alkaline Phosphatase: 53 U/L (ref 38–126)
Anion gap: 8 (ref 5–15)
BUN: 29 mg/dL — ABNORMAL HIGH (ref 6–20)
CO2: 27 mmol/L (ref 22–32)
Calcium: 9.4 mg/dL (ref 8.9–10.3)
Chloride: 101 mmol/L (ref 98–111)
Creatinine, Ser: 1.14 mg/dL (ref 0.61–1.24)
GFR, Estimated: >60 mL/min (ref >60)
Glucose, Bld: 78 mg/dL (ref 70–99)
Potassium: 4.2 mmol/L (ref 3.5–5.1)
Sodium: 136 mmol/L (ref 135–145)
Total Bilirubin: 0.6 mg/dL (ref 0.3–1.2)
Total Protein: 8.5 g/dL — ABNORMAL HIGH (ref 6.5–8.1)

## 2020-11-22 LAB — CBC WITH DIFFERENTIAL/PLATELET
Basophils Absolute: 0 10*3/uL (ref 0.0–0.1)
Basophils Relative: 0 %
Eosinophils Absolute: 0 10*3/uL (ref 0.0–0.5)
Eosinophils Relative: 0 %
HCT: 33.4 % — ABNORMAL LOW (ref 39.0–52.0)
Hemoglobin: 9.8 g/dL — ABNORMAL LOW (ref 13.0–17.0)
Lymphocytes Relative: 95 %
Lymphs Abs: 84.6 10*3/uL — ABNORMAL HIGH (ref 0.7–4.0)
MCH: 27 pg (ref 26.0–34.0)
MCHC: 29.3 g/dL — ABNORMAL LOW (ref 30.0–36.0)
MCV: 92 fL (ref 80.0–100.0)
Monocytes Absolute: 0.9 10*3/uL (ref 0.1–1.0)
Monocytes Relative: 1 %
Neutro Abs: 3.6 10*3/uL (ref 1.7–7.7)
Neutrophils Relative %: 4 %
Platelets: 130 10*3/uL — ABNORMAL LOW (ref 150–400)
RBC: 3.63 MIL/uL — ABNORMAL LOW (ref 4.22–5.81)
RDW: 15.2 % (ref 11.5–15.5)
WBC: 89 10*3/uL (ref 4.0–10.5)
nRBC: 0 % (ref 0.0–0.2)

## 2020-11-22 LAB — IRON AND TIBC
Iron: 36 ug/dL — ABNORMAL LOW (ref 45–182)
Saturation Ratios: 9 % — ABNORMAL LOW (ref 17.9–39.5)
TIBC: 395 ug/dL (ref 250–450)
UIBC: 359 ug/dL

## 2020-11-22 LAB — FERRITIN: Ferritin: 9 ng/mL — ABNORMAL LOW (ref 24–336)

## 2020-11-22 LAB — LACTATE DEHYDROGENASE: LDH: 103 U/L (ref 98–192)

## 2020-11-23 NOTE — Progress Notes (Signed)
Late entry:   Critical value alert  WBC 89.0  Provider aware. Patient will be seen in clinic as scheduled.

## 2020-11-27 ENCOUNTER — Other Ambulatory Visit: Payer: Self-pay

## 2020-11-27 ENCOUNTER — Inpatient Hospital Stay (HOSPITAL_BASED_OUTPATIENT_CLINIC_OR_DEPARTMENT_OTHER): Payer: Medicare Other | Admitting: Hematology

## 2020-11-27 ENCOUNTER — Other Ambulatory Visit (HOSPITAL_COMMUNITY): Payer: Self-pay

## 2020-11-27 VITALS — BP 134/70 | HR 71 | Temp 97.2°F | Resp 18 | Wt 154.0 lb

## 2020-11-27 DIAGNOSIS — M8580 Other specified disorders of bone density and structure, unspecified site: Secondary | ICD-10-CM | POA: Diagnosis not present

## 2020-11-27 DIAGNOSIS — D509 Iron deficiency anemia, unspecified: Secondary | ICD-10-CM | POA: Diagnosis not present

## 2020-11-27 DIAGNOSIS — C8310 Mantle cell lymphoma, unspecified site: Secondary | ICD-10-CM | POA: Diagnosis not present

## 2020-11-27 DIAGNOSIS — C8314 Mantle cell lymphoma, lymph nodes of axilla and upper limb: Secondary | ICD-10-CM | POA: Diagnosis not present

## 2020-11-27 NOTE — Progress Notes (Signed)
Lance Erickson, [place] [address]   CLINIC:  Medical Oncology/Hematology  PCP:  [name], MD [address] [place] [address]  [phone]  REASON FOR VISIT:  Follow-up for indolent mantle cell lymphoma and IDA  PRIOR THERAPY: None  CURRENT THERAPY: Iron tablets daily; intermittent Feraheme last on 06/18/2020  INTERVAL HISTORY:  [name], a 60 y.o. male, returns for routine follow-up for his indolent mantle cell lymphoma and IDA. Lance Erickson was last seen on 07/24/2020.  Today he reports feeling well. He denies having any recent infections, F/C, night sweats or weight loss. He denies having any new pains or adenopathy. He denies having melena, hematochezia, hematuria or nosebleeds. He takes 2 iron tablets daily and denies having constipation. He denies having any skin rashes.   REVIEW OF SYSTEMS:  Review of Systems  Constitutional: Negative for appetite change, chills, diaphoresis, fatigue, fever and unexpected weight change.  HENT:   Negative for nosebleeds.   Gastrointestinal: Negative for blood in stool and constipation.  Genitourinary: Negative for hematuria.   Musculoskeletal: Negative for arthralgias and myalgias.  Hematological: Negative for adenopathy.  All other systems reviewed and are negative.   PAST MEDICAL/SURGICAL HISTORY:  Past Medical History:  Diagnosis Date  . Cancer ([place]) 08/2015   skin, nose  . Leukemia ([place])   . Spinal bifida, closed    Past Surgical History:  Procedure Laterality Date  . FRACTURE SURGERY     left arm   . INGUINAL HERNIA REPAIR Right     SOCIAL HISTORY:  Social History   Socioeconomic History  . Marital status: Single    Spouse name: Not on file  . Number of children: Not on file  . Years of education: Not on file  . Highest education level: Not on file  Occupational History  . Occupation: disabled  Tobacco Use  . Smoking status: Never Smoker  . Smokeless tobacco: Never  Used  Vaping Use  . Vaping Use: Never used  Substance and Sexual Activity  . Alcohol use: No  . Drug use: No  . Sexual activity: Not Currently  Other Topics Concern  . Not on file  Social History Narrative  . Not on file   Social Determinants of Health   Financial Resource Strain: Not on file  Food Insecurity: Not on file  Transportation Needs: Not on file  Physical Activity: Not on file  Stress: Not on file  Social Connections: Not on file  Intimate Partner Violence: Not on file    FAMILY HISTORY:  Family History  Problem Relation Age of Onset  . Hypertension Mother   . Deep vein thrombosis Mother   . Hyperlipidemia Mother   . Cancer Mother        skin  . Heart attack Father   . Diabetes Brother   . Hypertension Brother   . Hyperlipidemia Brother   . Heart attack Brother   . Arthritis Brother        back pain  . Arthritis Brother        back pain  . Cancer Maternal Grandmother        varian and uterus cancer  . Heart attack Maternal Grandmother   . Alcohol abuse Maternal Grandfather   . Tuberculosis Maternal Grandfather   . Heart disease Paternal Grandmother   . Pneumonia Paternal Grandfather     CURRENT MEDICATIONS:  No current outpatient medications on file.   No current facility-administered medications  for this visit.    ALLERGIES:  No Known Allergies  PHYSICAL EXAM:  Performance status (ECOG): 1 - Symptomatic but completely ambulatory  Vitals:   11/27/20 1432  BP: 134/70  Pulse: 71  Resp: 18  Temp: (!) 97.2 F (36.2 C)  SpO2: 100%   Wt Readings from Last 3 Encounters:  11/27/20 154 lb (69.9 kg)  07/24/20 152 lb 14.4 oz (69.4 kg)  04/05/20 160 lb (72.6 kg)   Physical Exam Vitals reviewed.  Constitutional:      Appearance: Normal appearance.  Cardiovascular:     Rate and Rhythm: Normal rate and regular rhythm.     Pulses: Normal pulses.     Heart sounds: Normal heart sounds.  Pulmonary:     Effort: Pulmonary effort is normal.      Breath sounds: Normal breath sounds.  Chest:  Breasts:     Right: Axillary adenopathy (R > L axillary lymphadenopathy) present.     Left: Axillary adenopathy present.    Abdominal:     Palpations: Abdomen is soft. There is no hepatomegaly, splenomegaly or mass.     Hernia: No hernia is present.  Musculoskeletal:     Right lower leg: No edema.     Left lower leg: No edema.  Lymphadenopathy:     Upper Body:     Right upper body: Axillary adenopathy (R > L axillary lymphadenopathy) present.     Left upper body: Axillary adenopathy present.     Lower Body: Right inguinal adenopathy present. No left inguinal adenopathy.  Neurological:     General: No focal deficit present.     Mental Status: He is alert and oriented to person, place, and time.  Psychiatric:        Mood and Affect: Mood normal.        Behavior: Behavior normal.     LABORATORY DATA:  I have reviewed the labs as listed.  CBC Latest Ref Rng & Units 11/22/2020 07/24/2020 06/04/2020  WBC 4.0 - 10.5 K/uL 89.0(HH) 50.0(H) 43.3(H)  Hemoglobin 13.0 - 17.0 g/dL 9.8(L) 11.5(L) 9.8(L)  Hematocrit 39.0 - 52.0 % 33.4(L) 37.9(L) 33.2(L)  Platelets 150 - 400 K/uL 130(L) 108(L) 105(L)   CMP Latest Ref Rng & Units 11/22/2020 07/24/2020 06/04/2020  Glucose 70 - 99 mg/dL 78 94 86  BUN 6 - 20 mg/dL 29(H) 22(H) 23(H)  Creatinine 0.61 - 1.24 mg/dL 1.14 1.13 1.06  Sodium 135 - 145 mmol/L 136 138 141  Potassium 3.5 - 5.1 mmol/L 4.2 4.6 4.3  Chloride 98 - 111 mmol/L 101 102 107  CO2 22 - 32 mmol/L 27 27 25   Calcium 8.9 - 10.3 mg/dL 9.4 9.3 9.1  Total Protein 6.5 - 8.1 g/dL 8.5(H) 8.1 7.6  Total Bilirubin 0.3 - 1.2 mg/dL 0.6 0.7 0.8  Alkaline Phos 38 - 126 U/L 53 50 43  AST 15 - 41 U/L 25 20 19   ALT 0 - 44 U/L 14 12 11       Component Value Date/Time   RBC 3.63 (L) 11/22/2020 1214   MCV 92.0 11/22/2020 1214   MCV 85 10/08/2017 0950   MCH 27.0 11/22/2020 1214   MCHC 29.3 (L) 11/22/2020 1214   RDW 15.2 11/22/2020 1214   RDW 14.7  10/08/2017 0950   LYMPHSABS 84.6 (H) 11/22/2020 1214   LYMPHSABS 11.2 (H) 10/08/2017 0950   MONOABS 0.9 11/22/2020 1214   EOSABS 0.0 11/22/2020 1214   EOSABS 0.1 10/08/2017 0950   BASOSABS 0.0 11/22/2020 1214   BASOSABS  0.1 10/08/2017 0950   Lab Results  Component Value Date   LDH 103 11/22/2020   LDH 102 06/04/2020   LDH 109 03/15/2020   Lab Results  Component Value Date   TIBC 395 11/22/2020   TIBC 308 07/24/2020   TIBC 428 06/04/2020   FERRITIN 9 (L) 11/22/2020   FERRITIN 42 07/24/2020   FERRITIN 4 (L) 06/04/2020   IRONPCTSAT 9 (L) 11/22/2020   IRONPCTSAT 19 07/24/2020   IRONPCTSAT 11 (L) 06/04/2020    DIAGNOSTIC IMAGING:  I have independently reviewed the scans and discussed with the patient. No results found.   ASSESSMENT:  1. Indolent mantle cell lymphoma: -BMBX on 12/03/2017 showed hypercellular marrow with involvement by NHL. Bone marrow is predominantly involved by small lymphoid cells expressing pan B cell antigens including CD20, CD5 and cyclin D1 expression. Chromosome analysis shows 76, XY. CLL FISH panel was negative. Minor population of monoclonal, kappa restricted plasma cells are present 2% of cells. -Labs on 03/15/2020 shows M spike of 1.5 g. White count is 39.7. Platelet count is 116. Hemoglobin is 10.6. -PET scan on 04/02/2020 shows left axillary lymphadenopathy, SUV max 2.6. No mediastinal or hilar adenopathy. Right inguinal adenopathy with SUV 1.6. No splenic hypermetabolism. Splenomegaly measuring 15.2 cm. New mild compression deformity at T11 with hypermetabolism, SUV 4.1. There is also new hypermetabolic within the spinous process of T2, SUV 3.8.  2. Osteopenia: -Bone density on 06/04/2020 showed T score of -2.2.   PLAN:  1.Indolent mantle cell lymphoma: -Bilateral axillary adenopathy, right more than left and right inguinal adenopathy stable. - CBC shows white count increased to 89 with 95% lymphocytes.  Platelet count is stable. -  Hemoglobin dropped because of drop in ferritin. - He does not have any fevers, night sweats or weight loss. - We will continue to monitor him closely.  2. Bone lesions: -He had T11 compression deformity. - Prolia 60 mg every 6 months started on 07/24/2020.  He does not report any side effects from it.  3. Normocytic anemia: -Last Feraheme was on 06/18/2020.  Denies any bleeding per rectum or melena. - he is taking iron tablet twice daily. - Reviewed labs from 11/22/2020.  Percent saturation is 9 and ferritin is 9.  Hemoglobin dropped to 9.8. - Recommended Feraheme weekly x2. - RTC 3 months with repeat labs.  Orders placed this encounter:  No orders of the defined types were placed in this encounter.    Derek Jack, MD [place] [phone]   I, Milinda Antis, am acting as a scribe for Dr. Sanda Linger.  I, Derek Jack MD, have reviewed the above documentation for accuracy and completeness, and I agree with the above.

## 2020-11-27 NOTE — Patient Instructions (Signed)
[place] at [facility] Discharge Instructions  You were seen today by Dr. Delton Coombes. He went over your recent results. Your iron levels have dropped, so you will be scheduled to received 2 iron infusions 1 week apart. Dr. Delton Coombes will see you back in 3 months for labs and follow up.   Thank you for choosing [place] at [facility] to provide your oncology and hematology care.  To afford each patient quality time with our provider, please arrive at least 15 minutes before your scheduled appointment time.   If you have a lab appointment with the [facility] please come in thru the Main Entrance and check in at the main information desk  You need to re-schedule your appointment should you arrive 10 or more minutes late.  We strive to give you quality time with our providers, and arriving late affects you and other patients whose appointments are after yours.  Also, if you no show three or more times for appointments you may be dismissed from the clinic at the providers discretion.     Again, thank you for choosing [facility].  Our hope is that these requests will decrease the amount of time that you wait before being seen by our physicians.       _____________________________________________________________  Should you have questions after your visit to [facility], please contact our office at [phone] between the hours of 8:00 a.m. and 4:30 p.m.  Voicemails left after 4:00 p.m. will not be returned until the following business day.  For prescription refill requests, have your pharmacy contact our office and allow 72 hours.    Cancer Center Support Programs:   > Cancer Support Group  2nd Tuesday of the month 1pm-2pm, Journey Room

## 2020-11-29 ENCOUNTER — Ambulatory Visit (HOSPITAL_COMMUNITY): Payer: Medicare Other | Admitting: Hematology

## 2020-12-04 ENCOUNTER — Inpatient Hospital Stay (HOSPITAL_COMMUNITY): Payer: Medicare Other

## 2020-12-04 ENCOUNTER — Other Ambulatory Visit: Payer: Self-pay

## 2020-12-04 VITALS — BP 118/66 | HR 64 | Temp 97.8°F | Resp 17

## 2020-12-04 DIAGNOSIS — M8580 Other specified disorders of bone density and structure, unspecified site: Secondary | ICD-10-CM | POA: Diagnosis not present

## 2020-12-04 DIAGNOSIS — D509 Iron deficiency anemia, unspecified: Secondary | ICD-10-CM | POA: Diagnosis not present

## 2020-12-04 DIAGNOSIS — C8314 Mantle cell lymphoma, lymph nodes of axilla and upper limb: Secondary | ICD-10-CM | POA: Diagnosis not present

## 2020-12-04 MED ORDER — SODIUM CHLORIDE 0.9 % IV SOLN
510.0000 mg | Freq: Once | INTRAVENOUS | Status: AC
  Administered 2020-12-04: 510 mg via INTRAVENOUS
  Filled 2020-12-04: qty 510

## 2020-12-04 MED ORDER — FAMOTIDINE 20 MG PO TABS
20.0000 mg | ORAL_TABLET | Freq: Once | ORAL | Status: AC
  Administered 2020-12-04: 20 mg via ORAL
  Filled 2020-12-04: qty 1

## 2020-12-04 MED ORDER — SODIUM CHLORIDE 0.9 % IV SOLN: Freq: Once | INTRAVENOUS | Status: AC

## 2020-12-04 MED ORDER — LORATADINE 10 MG PO TABS
10.0000 mg | ORAL_TABLET | Freq: Once | ORAL | Status: AC
  Administered 2020-12-04: 10 mg via ORAL
  Filled 2020-12-04: qty 1

## 2020-12-04 MED ORDER — ACETAMINOPHEN 325 MG PO TABS
650.0000 mg | ORAL_TABLET | Freq: Once | ORAL | Status: AC
  Administered 2020-12-04: 650 mg via ORAL
  Filled 2020-12-04: qty 2

## 2020-12-04 NOTE — Patient Instructions (Signed)
[facility] Discharge Instructions for Patients Receiving Chemotherapy  Today you received the following chemotherapy agents   To help prevent nausea and vomiting after your treatment, we encourage you to take your nausea medication   If you develop nausea and vomiting that is not controlled by your nausea medication, call the clinic.   BELOW ARE SYMPTOMS THAT SHOULD BE REPORTED IMMEDIATELY:  *FEVER GREATER THAN 100.5 F  *CHILLS WITH OR WITHOUT FEVER  NAUSEA AND VOMITING THAT IS NOT CONTROLLED WITH YOUR NAUSEA MEDICATION  *UNUSUAL SHORTNESS OF BREATH  *UNUSUAL BRUISING OR BLEEDING  TENDERNESS IN MOUTH AND THROAT WITH OR WITHOUT PRESENCE OF ULCERS  *URINARY PROBLEMS  *BOWEL PROBLEMS  UNUSUAL RASH Items with * indicate a potential emergency and should be followed up as soon as possible.  Feel free to call the clinic should you have any questions or concerns. The clinic phone number is [phone].  Please show the CHEMO ALERT CARD at check-in to the Emergency Department and triage nurse.   

## 2020-12-04 NOTE — Progress Notes (Signed)
Patient presents today for Feraheme infusion.  Vital signs WNL.  No new complaints since last visit.  Treatment given today per MD orders.  Tolerated infusion without adverse affects.  Vital signs stable.  No complaints at this time.  Discharge from clinic ambulatory in stable condition.  Alert and oriented X 3.  Follow up with  Cancer Center as scheduled. 

## 2020-12-11 ENCOUNTER — Inpatient Hospital Stay (HOSPITAL_COMMUNITY): Payer: Medicare Other | Attending: Hematology

## 2020-12-11 VITALS — BP 119/60 | HR 69 | Temp 97.5°F | Resp 18

## 2020-12-11 DIAGNOSIS — C8314 Mantle cell lymphoma, lymph nodes of axilla and upper limb: Secondary | ICD-10-CM | POA: Insufficient documentation

## 2020-12-11 DIAGNOSIS — D509 Iron deficiency anemia, unspecified: Secondary | ICD-10-CM | POA: Diagnosis not present

## 2020-12-11 MED ORDER — SODIUM CHLORIDE 0.9 % IV SOLN: Freq: Once | INTRAVENOUS | Status: AC

## 2020-12-11 MED ORDER — SODIUM CHLORIDE 0.9 % IV SOLN
510.0000 mg | Freq: Once | INTRAVENOUS | Status: AC
  Administered 2020-12-11: 510 mg via INTRAVENOUS
  Filled 2020-12-11: qty 17

## 2020-12-11 NOTE — Patient Instructions (Signed)
[facility] at [facility]  Discharge Instructions:   _______________________________________________________________  Thank you for choosing [facility] at [facility] to provide your oncology and hematology care.  To afford each patient quality time with our providers, please arrive at least 15 minutes before your scheduled appointment.  You need to re-schedule your appointment if you arrive 10 or more minutes late.  We strive to give you quality time with our providers, and arriving late affects you and other patients whose appointments are after yours.  Also, if you no show three or more times for appointments you may be dismissed from the clinic.  Again, thank you for choosing [facility] at [facility]. Our hope is that these requests will allow you access to exceptional care and in a timely manner. _______________________________________________________________  If you have questions after your visit, please contact our office at [phone] between the hours of 8:30 a.m. and 5:00 p.m. Voicemails left after 4:30 p.m. will not be returned until the following business day. _______________________________________________________________  For prescription refill requests, have your pharmacy contact our office. _______________________________________________________________  Recommendations made by the consultant and any test results will be sent to your referring physician. _______________________________________________________________ 

## 2020-12-11 NOTE — Progress Notes (Signed)
Pt here for iron infusion.  No complaints today. Tolerated iron infusion well today without incidence.  Discharged in stable condition ambulatory.  Vital signs stable prior to discharge.

## 2021-01-13 ENCOUNTER — Other Ambulatory Visit: Payer: Self-pay

## 2021-01-13 ENCOUNTER — Emergency Department (HOSPITAL_COMMUNITY): Payer: Medicare Other

## 2021-01-13 ENCOUNTER — Encounter (HOSPITAL_COMMUNITY): Payer: Self-pay

## 2021-01-13 ENCOUNTER — Emergency Department (HOSPITAL_COMMUNITY)
Admission: EM | Admit: 2021-01-13 | Discharge: 2021-01-14 | Disposition: A | Discharge status: Home / Self Care | Payer: Medicare Other | Attending: Emergency Medicine | Admitting: Emergency Medicine

## 2021-01-13 DIAGNOSIS — Z856 Personal history of leukemia: Secondary | ICD-10-CM | POA: Diagnosis not present

## 2021-01-13 DIAGNOSIS — R079 Chest pain, unspecified: Secondary | ICD-10-CM

## 2021-01-13 DIAGNOSIS — Z85828 Personal history of other malignant neoplasm of skin: Secondary | ICD-10-CM | POA: Insufficient documentation

## 2021-01-13 DIAGNOSIS — I517 Cardiomegaly: Secondary | ICD-10-CM | POA: Diagnosis not present

## 2021-01-13 DIAGNOSIS — R0789 Other chest pain: Secondary | ICD-10-CM | POA: Diagnosis not present

## 2021-01-13 LAB — COMPREHENSIVE METABOLIC PANEL
ALT: 14 U/L (ref 0–44)
AST: 18 U/L (ref 15–41)
Albumin: 3.4 g/dL — ABNORMAL LOW (ref 3.5–5.0)
Alkaline Phosphatase: 58 U/L (ref 38–126)
Anion gap: 10 (ref 5–15)
BUN: 41 mg/dL — ABNORMAL HIGH (ref 6–20)
CO2: 23 mmol/L (ref 22–32)
Calcium: 9.5 mg/dL (ref 8.9–10.3)
Chloride: 107 mmol/L (ref 98–111)
Creatinine, Ser: 1.63 mg/dL — ABNORMAL HIGH (ref 0.61–1.24)
GFR, Estimated: 49 mL/min — ABNORMAL LOW (ref >60)
Glucose, Bld: 117 mg/dL — ABNORMAL HIGH (ref 70–99)
Potassium: 4.6 mmol/L (ref 3.5–5.1)
Sodium: 140 mmol/L (ref 135–145)
Total Bilirubin: 0.7 mg/dL (ref 0.3–1.2)
Total Protein: 8.5 g/dL — ABNORMAL HIGH (ref 6.5–8.1)

## 2021-01-13 LAB — CBC WITH DIFFERENTIAL/PLATELET
Basophils Absolute: 0 10*3/uL (ref 0.0–0.1)
Basophils Relative: 0 %
Eosinophils Absolute: 0 10*3/uL (ref 0.0–0.5)
Eosinophils Relative: 0 %
HCT: 33 % — ABNORMAL LOW (ref 39.0–52.0)
Hemoglobin: 10 g/dL — ABNORMAL LOW (ref 13.0–17.0)
Lymphocytes Relative: 92 %
Lymphs Abs: 46.4 10*3/uL — ABNORMAL HIGH (ref 0.7–4.0)
MCH: 28.4 pg (ref 26.0–34.0)
MCHC: 30.3 g/dL (ref 30.0–36.0)
MCV: 93.8 fL (ref 80.0–100.0)
Monocytes Absolute: 1.1 10*3/uL — ABNORMAL HIGH (ref 0.1–1.0)
Monocytes Relative: 2 %
Neutro Abs: 3 10*3/uL (ref 1.7–7.7)
Neutrophils Relative %: 5 %
Platelets: 70 10*3/uL — ABNORMAL LOW (ref 150–400)
RBC: 3.52 MIL/uL — ABNORMAL LOW (ref 4.22–5.81)
RDW: 18.1 % — ABNORMAL HIGH (ref 11.5–15.5)
WBC: 50.5 10*3/uL (ref 4.0–10.5)
nRBC: 0 % (ref 0.0–0.2)

## 2021-01-13 LAB — TROPONIN I (HIGH SENSITIVITY)
Troponin I (High Sensitivity): 3 ng/L (ref <18)
Troponin I (High Sensitivity): 3 ng/L (ref <18)

## 2021-01-13 MED ORDER — KETOROLAC TROMETHAMINE 30 MG/ML IJ SOLN
30.0000 mg | Freq: Once | INTRAMUSCULAR | Status: AC
  Administered 2021-01-13: 30 mg via INTRAMUSCULAR
  Filled 2021-01-13: qty 1

## 2021-01-13 MED ORDER — SODIUM CHLORIDE 0.9 % IV BOLUS
1000.0000 mL | Freq: Once | INTRAVENOUS | Status: AC
  Administered 2021-01-13: 1000 mL via INTRAVENOUS

## 2021-01-13 MED ORDER — IOHEXOL 350 MG/ML SOLN
100.0000 mL | Freq: Once | INTRAVENOUS | Status: AC | PRN
  Administered 2021-01-13: 100 mL via INTRAVENOUS

## 2021-01-13 MED ORDER — KETOROLAC TROMETHAMINE 30 MG/ML IJ SOLN: 30.0000 mg | Freq: Once | INTRAMUSCULAR | Status: DC

## 2021-01-13 NOTE — ED Triage Notes (Signed)
Pt to er, pt states that he is here for chest pain off an on for the past month, states that the pain is very sporadic, pt denies sob.

## 2021-01-13 NOTE — ED Provider Notes (Signed)
[facility] EMERGENCY DEPARTMENT Provider Note   CSN: [id number] Arrival date & time: 01/13/21  1922     History Chief Complaint  Patient presents with  . Chest Pain    Lance Erickson is a 60 y.o. male.  HPI  HPI: A 60 year old patient presents for evaluation of chest pain. Initial onset of pain was more than 6 hours ago. The patient's chest pain is sharp and is worse with exertion. The patient's chest pain is not middle- or left-sided, is not well-localized, is not described as heaviness/pressure/tightness and does not radiate to the arms/jaw/neck. The patient does not complain of nausea and denies diaphoresis. The patient has no history of stroke, has no history of peripheral artery disease, has not smoked in the past 90 days, denies any history of treated diabetes, has no relevant family history of coronary artery disease (first degree relative at less than age 59), is not hypertensive, has no history of hypercholesterolemia and does not have an elevated BMI (>=30).    Pt is a 60 y/o male with a h/o leukemia, spina bifida, who presents to the ED today for eval of chest pain. States he has had pain to the right side of the chest intermittently for a month. Starting today the pain has been worse and constant. Describes pain as a sharp pain and rates it 6-7/10.  Pain is worse with coughing, certain movements, certain positions (laying flat), and sometimes with exertion. Denies associated SOB. Has had a dry cough for months which he thinks is due to allergies. Denies fevers, ble swelling. Denies any recent trauma.   Denies h/o tobacco use and denies any early family hx of heart disease   Past Medical History:  Diagnosis Date  . Cancer ([place]) 08/2015   skin, nose  . Leukemia ([place])   . Spinal bifida, closed     Patient Active Problem List   Diagnosis Date Noted  . Anemia, iron deficiency 06/11/2020  . Lymphoproliferative disease ([place]) 11/16/2017  . Spina bifida ([place]) 05/02/2015  .  Annual physical exam 05/02/2015    Past Surgical History:  Procedure Laterality Date  . FRACTURE SURGERY     left arm   . INGUINAL HERNIA REPAIR Right        Family History  Problem Relation Age of Onset  . Hypertension Mother   . Deep vein thrombosis Mother   . Hyperlipidemia Mother   . Cancer Mother        skin  . Heart attack Father   . Diabetes Brother   . Hypertension Brother   . Hyperlipidemia Brother   . Heart attack Brother   . Arthritis Brother        back pain  . Arthritis Brother        back pain  . Cancer Maternal Grandmother        varian and uterus cancer  . Heart attack Maternal Grandmother   . Alcohol abuse Maternal Grandfather   . Tuberculosis Maternal Grandfather   . Heart disease Paternal Grandmother   . Pneumonia Paternal Grandfather     Social History   Tobacco Use  . Smoking status: Never Smoker  . Smokeless tobacco: Never Used  Vaping Use  . Vaping Use: Never used  Substance Use Topics  . Alcohol use: No  . Drug use: No    Home Medications Prior to Admission medications   Medication Sig Start Date End Date Taking? Authorizing Provider  acetaminophen (TYLENOL) 500 MG tablet Take 1,000 mg  by mouth every 6 (six) hours as needed.   Yes [provider]    Allergies    Patient has no known allergies.  Review of Systems   Review of Systems  Constitutional: Negative for fever.  HENT: Negative for ear pain and sore throat.   Eyes: Negative for visual disturbance.  Respiratory: Positive for cough. Negative for shortness of breath.   Cardiovascular: Positive for chest pain.  Gastrointestinal: Negative for abdominal pain, constipation, diarrhea, nausea and vomiting.  Genitourinary: Negative for dysuria and hematuria.  Musculoskeletal: Negative for myalgias.  Skin: Negative for rash.  Neurological: Negative for headaches.  All other systems reviewed and are negative.   Physical Exam Updated Vital Signs BP (!) 155/64 (BP  Location: Right Arm)   Pulse 96   Temp 98.3 F (36.8 C) (Oral)   Resp 18   Ht 5' 10.5" (1.791 m)   Wt 70.3 kg   SpO2 98%   BMI 21.93 kg/m   Physical Exam Vitals and nursing note reviewed.  Constitutional:      Appearance: He is well-developed and well-nourished.  HENT:     Head: Normocephalic and atraumatic.  Eyes:     Conjunctiva/sclera: Conjunctivae normal.  Cardiovascular:     Rate and Rhythm: Normal rate and regular rhythm.     Heart sounds: No murmur heard.   Pulmonary:     Effort: Pulmonary effort is normal. No respiratory distress.     Breath sounds: Normal breath sounds. No decreased breath sounds, wheezing, rhonchi or rales.  Chest:     Chest wall: Tenderness (to the right chest which reproduces pain) present.  Abdominal:     Palpations: Abdomen is soft.     Tenderness: There is no abdominal tenderness. There is no guarding or rebound.  Musculoskeletal:        General: No edema.     Cervical back: Neck supple.     Right lower leg: No tenderness. No edema.     Left lower leg: No tenderness. No edema.  Skin:    General: Skin is warm and dry.  Neurological:     Mental Status: He is alert.  Psychiatric:        Mood and Affect: Mood and affect normal.     ED Results / Procedures / Treatments   Labs (all labs ordered are listed, but only abnormal results are displayed) Labs Reviewed  COMPREHENSIVE METABOLIC PANEL - Abnormal; Notable for the following components:      Result Value   Glucose, Bld 117 (*)    BUN 41 (*)    Creatinine, Ser 1.63 (*)    Total Protein 8.5 (*)    Albumin 3.4 (*)    GFR, Estimated 49 (*)    All other components within normal limits  CBC WITH DIFFERENTIAL/PLATELET - Abnormal; Notable for the following components:   WBC 50.5 (*)    RBC 3.52 (*)    Hemoglobin 10.0 (*)    HCT 33.0 (*)    RDW 18.1 (*)    Platelets 70 (*)    Lymphs Abs 46.4 (*)    Monocytes Absolute 1.1 (*)    All other components within normal limits  TROPONIN  I (HIGH SENSITIVITY)  TROPONIN I (HIGH SENSITIVITY)    EKG EKG Interpretation  Date/Time:  Sunday January 13 2021 19:40:12 EST Ventricular Rate:  86 PR Interval:    QRS Duration: 90 QT Interval:  341 QTC Calculation: 408 R Axis:   94 Text Interpretation: Sinus rhythm  Prolonged PR interval Left atrial enlargement Anteroseptal infarct, age indeterminate No previous ECGs available Confirmed by Fredia Sorrow [phone]) on 01/13/2021 7:53:19 PM   Radiology DG Chest Portable 1 View  Result Date: 01/13/2021 CLINICAL DATA:  Right-sided chest pain for 1 month, no known injury, initial encounter EXAM: PORTABLE CHEST 1 VIEW COMPARISON:  04/02/2020 FINDINGS: Cardiac shadow is at the upper limits of normal in size. The lungs are well aerated bilaterally. No focal infiltrate or effusion is seen. Bony structures show no acute abnormality. 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 IMPRESSION: No acute abnormality noted. Electronically Signed   By: Inez Catalina M.D.   On: 01/13/2021 21:01    Procedures Procedures   Medications Ordered in ED Medications  ketorolac (TORADOL) 30 MG/ML injection 30 mg (30 mg Intramuscular Given 01/13/21 2055)  sodium chloride 0.9 % bolus 1,000 mL (1,000 mLs Intravenous New Bag/Given 01/13/21 2146)  iohexol (OMNIPAQUE) 350 MG/ML injection 100 mL (100 mLs Intravenous Contrast Given 01/13/21 2254)    ED Course  I have reviewed the triage vital signs and the nursing notes.  Pertinent labs & imaging results that were available during my care of the patient were reviewed by me and considered in my medical decision making (see chart for details).    MDM Rules/Calculators/A&P HEAR Score: 1                        60 y/o M presenting for eval of chest pain. Seems atypical in nature. Reproducible on exam.   Reviewed/interpreted labs CBC with leukocytosis which appears chronic, anemia present but stable BMP with elevated BUN/creatinine at 41 and 1.63, otherwise unremarkable Trop  negative, delta troponin pending at shift change   EKG - Sinus rhythm Prolonged PR interval Left atrial enlargement Anteroseptal infarct, age indeterminate No previous ECGs available  CXR with no acute abnormality noted CTA chest pending at shift change  HEART score of 1.   Patient here with right-sided chest pain.  This seems MSK in nature. Doubt acs, increased risk for pe given ca hx so cta ordered. At shift change, pending cta chest. Care signed out to Dr. Stark Jock. If neg, consider admission for aki vs dc with close fu.  Final Clinical Impression(s) / ED Diagnoses Final diagnoses:  None    Rx / DC Orders ED Discharge Orders    None       Bishop Dublin 01/13/21 2301    Veryl Speak, MD 01/14/21 [phone]

## 2021-01-13 NOTE — ED Notes (Signed)
Date and time results received: 01/13/21 @ 2144  Test: CBC Critical Value:WBC 50.5 Name of Provider Notified: Tivis Ringer, PA-C Orders Received? No  Or Actions Taken?: No

## 2021-01-14 NOTE — Discharge Instructions (Addendum)
Take Tylenol 1000 mg rotated with ibuprofen 600 mg every 4 hours as needed for pain.  Drink plenty of fluids and get plenty of rest.  You should follow-up with your primary doctor later this week for repeat basic metabolic panel and discussion of the results of the CT scan which was performed this evening.  Return to the emergency department in the meantime if you develop worsening pain, difficulty breathing, high fevers, or other new and concerning symptoms.

## 2021-01-22 ENCOUNTER — Other Ambulatory Visit: Payer: Self-pay

## 2021-01-22 ENCOUNTER — Inpatient Hospital Stay (HOSPITAL_COMMUNITY): Payer: Medicare Other | Attending: Hematology

## 2021-01-22 ENCOUNTER — Encounter (HOSPITAL_COMMUNITY): Payer: Self-pay

## 2021-01-22 ENCOUNTER — Inpatient Hospital Stay (HOSPITAL_COMMUNITY): Payer: Medicare Other

## 2021-01-22 VITALS — BP 129/76 | HR 72 | Temp 97.2°F | Resp 18

## 2021-01-22 DIAGNOSIS — C8310 Mantle cell lymphoma, unspecified site: Secondary | ICD-10-CM | POA: Diagnosis not present

## 2021-01-22 DIAGNOSIS — M8580 Other specified disorders of bone density and structure, unspecified site: Secondary | ICD-10-CM | POA: Diagnosis not present

## 2021-01-22 DIAGNOSIS — D509 Iron deficiency anemia, unspecified: Secondary | ICD-10-CM

## 2021-01-22 LAB — COMPREHENSIVE METABOLIC PANEL
ALT: 17 U/L (ref 0–44)
AST: 22 U/L (ref 15–41)
Albumin: 3.2 g/dL — ABNORMAL LOW (ref 3.5–5.0)
Alkaline Phosphatase: 50 U/L (ref 38–126)
Anion gap: 10 (ref 5–15)
BUN: 28 mg/dL — ABNORMAL HIGH (ref 6–20)
CO2: 25 mmol/L (ref 22–32)
Calcium: 9.7 mg/dL (ref 8.9–10.3)
Chloride: 104 mmol/L (ref 98–111)
Creatinine, Ser: 1.3 mg/dL — ABNORMAL HIGH (ref 0.61–1.24)
GFR, Estimated: >60 mL/min (ref >60)
Glucose, Bld: 101 mg/dL — ABNORMAL HIGH (ref 70–99)
Potassium: 4 mmol/L (ref 3.5–5.1)
Sodium: 139 mmol/L (ref 135–145)
Total Bilirubin: 0.4 mg/dL (ref 0.3–1.2)
Total Protein: 8.8 g/dL — ABNORMAL HIGH (ref 6.5–8.1)

## 2021-01-22 LAB — CBC WITH DIFFERENTIAL/PLATELET
Basophils Absolute: 0 10*3/uL (ref 0.0–0.1)
Basophils Relative: 0 %
Eosinophils Absolute: 0 10*3/uL (ref 0.0–0.5)
Eosinophils Relative: 0 %
HCT: 33.7 % — ABNORMAL LOW (ref 39.0–52.0)
Hemoglobin: 10 g/dL — ABNORMAL LOW (ref 13.0–17.0)
Lymphocytes Relative: 93 %
Lymphs Abs: 77.2 10*3/uL — ABNORMAL HIGH (ref 0.7–4.0)
MCH: 28.4 pg (ref 26.0–34.0)
MCHC: 29.7 g/dL — ABNORMAL LOW (ref 30.0–36.0)
MCV: 95.7 fL (ref 80.0–100.0)
Monocytes Absolute: 0.8 10*3/uL (ref 0.1–1.0)
Monocytes Relative: 1 %
Neutro Abs: 5 10*3/uL (ref 1.7–7.7)
Neutrophils Relative %: 6 %
Platelets: 120 10*3/uL — ABNORMAL LOW (ref 150–400)
RBC: 3.52 MIL/uL — ABNORMAL LOW (ref 4.22–5.81)
RDW: 18 % — ABNORMAL HIGH (ref 11.5–15.5)
WBC: 83 10*3/uL (ref 4.0–10.5)
nRBC: 0 % (ref 0.0–0.2)

## 2021-01-22 MED ORDER — DENOSUMAB 60 MG/ML ~~LOC~~ SOSY
60.0000 mg | PREFILLED_SYRINGE | Freq: Once | SUBCUTANEOUS | Status: AC
  Administered 2021-01-22: 60 mg via SUBCUTANEOUS

## 2021-01-22 MED ORDER — DENOSUMAB 60 MG/ML ~~LOC~~ SOSY
PREFILLED_SYRINGE | SUBCUTANEOUS | Status: AC
  Filled 2021-01-22: qty 1

## 2021-01-22 NOTE — Progress Notes (Signed)
Patient taking calcium as directed.  Denied tooth, jaw, and leg pain.  No recent or upcoming dental visits.  Labs reviewed.  Patient tolerated injection with no complaints voiced.  See MAR for details.  Patient stable during and after injection.  Site clean and dry with no bruising or swelling noted.  Band aid applied.  Vss with discharge and left ambulatory with no s/s of distress noted.  

## 2021-01-22 NOTE — Progress Notes (Signed)
CRITICAL VALUE STICKER  CRITICAL VALUE: WBC 83  RECEIVER (on-site recipient of call): Alferd Apa, LPN  DATE & TIME NOTIFIED: 01/22/2021 1:13 pm  MESSENGER (representative from lab): Amber  MD NOTIFIED: Dr. Delton Coombes  TIME OF NOTIFICATION: 1:14 pm

## 2021-01-22 NOTE — Patient Instructions (Signed)
[facility] at [facility]  Discharge Instructions:   _______________________________________________________________  Thank you for choosing [facility] at [facility] to provide your oncology and hematology care.  To afford each patient quality time with our providers, please arrive at least 15 minutes before your scheduled appointment.  You need to re-schedule your appointment if you arrive 10 or more minutes late.  We strive to give you quality time with our providers, and arriving late affects you and other patients whose appointments are after yours.  Also, if you no show three or more times for appointments you may be dismissed from the clinic.  Again, thank you for choosing [facility]. Our hope is that these requests will allow you access to exceptional care and in a timely manner. _______________________________________________________________  If you have questions after your visit, please contact our office at [phone] between the hours of 8:30 a.m. and 5:00 p.m. Voicemails left after 4:30 p.m. will not be returned until the following business day. _______________________________________________________________  For prescription refill requests, have your pharmacy contact our office. _______________________________________________________________  Recommendations made by the consultant and any test results will be sent to your referring physician. _______________________________________________________________ 

## 2021-02-01 ENCOUNTER — Emergency Department (HOSPITAL_COMMUNITY): Payer: Medicare Other

## 2021-02-01 ENCOUNTER — Other Ambulatory Visit: Payer: Self-pay

## 2021-02-01 ENCOUNTER — Encounter (HOSPITAL_COMMUNITY): Payer: Self-pay | Admitting: Emergency Medicine

## 2021-02-01 ENCOUNTER — Emergency Department (HOSPITAL_COMMUNITY)
Admission: EM | Admit: 2021-02-01 | Discharge: 2021-02-01 | Disposition: A | Discharge status: Home / Self Care | Payer: Medicare Other | Attending: Emergency Medicine | Admitting: Emergency Medicine

## 2021-02-01 DIAGNOSIS — M545 Low back pain, unspecified: Secondary | ICD-10-CM | POA: Insufficient documentation

## 2021-02-01 DIAGNOSIS — C799 Secondary malignant neoplasm of unspecified site: Secondary | ICD-10-CM

## 2021-02-01 DIAGNOSIS — M899 Disorder of bone, unspecified: Secondary | ICD-10-CM | POA: Diagnosis not present

## 2021-02-01 DIAGNOSIS — M5459 Other low back pain: Secondary | ICD-10-CM | POA: Diagnosis not present

## 2021-02-01 DIAGNOSIS — Z85828 Personal history of other malignant neoplasm of skin: Secondary | ICD-10-CM | POA: Diagnosis not present

## 2021-02-01 DIAGNOSIS — M546 Pain in thoracic spine: Secondary | ICD-10-CM | POA: Diagnosis not present

## 2021-02-01 LAB — CBC WITH DIFFERENTIAL/PLATELET
Basophils Absolute: 0 10*3/uL (ref 0.0–0.1)
Basophils Relative: 0 %
Eosinophils Absolute: 0.6 10*3/uL — ABNORMAL HIGH (ref 0.0–0.5)
Eosinophils Relative: 1 %
HCT: 31.4 % — ABNORMAL LOW (ref 39.0–52.0)
Hemoglobin: 9.6 g/dL — ABNORMAL LOW (ref 13.0–17.0)
Lymphocytes Relative: 95 %
Lymphs Abs: 57.1 10*3/uL — ABNORMAL HIGH (ref 0.7–4.0)
MCH: 29.1 pg (ref 26.0–34.0)
MCHC: 30.6 g/dL (ref 30.0–36.0)
MCV: 95.2 fL (ref 80.0–100.0)
Monocytes Absolute: 0.6 10*3/uL (ref 0.1–1.0)
Monocytes Relative: 1 %
Neutro Abs: 1.8 10*3/uL (ref 1.7–7.7)
Neutrophils Relative %: 3 %
Platelets: 112 10*3/uL — ABNORMAL LOW (ref 150–400)
RBC: 3.3 MIL/uL — ABNORMAL LOW (ref 4.22–5.81)
RDW: 18.2 % — ABNORMAL HIGH (ref 11.5–15.5)
WBC: 60.1 10*3/uL (ref 4.0–10.5)
nRBC: 0 % (ref 0.0–0.2)

## 2021-02-01 LAB — COMPREHENSIVE METABOLIC PANEL
ALT: 13 U/L (ref 0–44)
AST: 21 U/L (ref 15–41)
Albumin: 3.1 g/dL — ABNORMAL LOW (ref 3.5–5.0)
Alkaline Phosphatase: 53 U/L (ref 38–126)
Anion gap: 9 (ref 5–15)
BUN: 22 mg/dL — ABNORMAL HIGH (ref 6–20)
CO2: 22 mmol/L (ref 22–32)
Calcium: 8.6 mg/dL — ABNORMAL LOW (ref 8.9–10.3)
Chloride: 108 mmol/L (ref 98–111)
Creatinine, Ser: 1.17 mg/dL (ref 0.61–1.24)
GFR, Estimated: >60 mL/min (ref >60)
Glucose, Bld: 103 mg/dL — ABNORMAL HIGH (ref 70–99)
Potassium: 4.1 mmol/L (ref 3.5–5.1)
Sodium: 139 mmol/L (ref 135–145)
Total Bilirubin: 0.7 mg/dL (ref 0.3–1.2)
Total Protein: 8.6 g/dL — ABNORMAL HIGH (ref 6.5–8.1)

## 2021-02-01 LAB — MAGNESIUM: Magnesium: 1.7 mg/dL (ref 1.7–2.4)

## 2021-02-01 MED ORDER — TRAMADOL HCL 50 MG PO TABS: 50.0000 mg | ORAL_TABLET | Freq: Four times a day (QID) | ORAL | 0 refills | Status: DC | PRN

## 2021-02-01 MED ORDER — GADOBUTROL 1 MMOL/ML IV SOLN
6.0000 mL | Freq: Once | INTRAVENOUS | Status: AC | PRN
  Administered 2021-02-01: 6 mL via INTRAVENOUS

## 2021-02-01 MED ORDER — LORAZEPAM 2 MG/ML IJ SOLN
1.0000 mg | Freq: Once | INTRAMUSCULAR | Status: AC
  Administered 2021-02-01: 1 mg via INTRAVENOUS
  Filled 2021-02-01: qty 1

## 2021-02-01 MED ORDER — DEXAMETHASONE 2 MG PO TABS: 2.0000 mg | ORAL_TABLET | Freq: Two times a day (BID) | ORAL | 0 refills | Status: AC

## 2021-02-01 MED ORDER — HYDROCODONE-ACETAMINOPHEN 5-325 MG PO TABS
1.0000 | ORAL_TABLET | Freq: Once | ORAL | Status: AC
  Administered 2021-02-01: 1 via ORAL
  Filled 2021-02-01: qty 1

## 2021-02-01 MED ORDER — DEXAMETHASONE SODIUM PHOSPHATE 4 MG/ML IJ SOLN
4.0000 mg | Freq: Once | INTRAMUSCULAR | Status: AC
  Administered 2021-02-01: 4 mg via INTRAVENOUS
  Filled 2021-02-01: qty 1

## 2021-02-01 NOTE — ED Provider Notes (Signed)
This patient has been seen and evaluated by Dr. Bobby Rumpf as well as by myself.  I discussed with the patient at the bedside his results including the MRI findings.  I also discussed this case with the oncologist Dr. Delton Coombes who is going to see the patient on Monday at 2:00 and recommends 2 mg of Decadron twice daily to help with any potential swelling.  I have confirmed with the patient that he has no weakness or numbness and on my exam he does have good sensation bilaterally and the ability to ambulate.  He does have tenderness in the mid back consistent with the destructive bony lesion in his lower thoracic spine.  I have reviewed his vital signs, he is not tachycardic or febrile, he is not having any incontinence, he is amenable to discharge and aware of the indications for return.   Noemi Chapel, MD 02/01/21 [phone]

## 2021-02-01 NOTE — ED Notes (Signed)
[place] called with appt. For Monday February 04, 2021.  Arrive at 2pm.

## 2021-02-01 NOTE — Discharge Instructions (Addendum)
Tramadol as needed for pain.  Follow-up with hematology oncology clinic here on Monday for these new lytic lesions.  If you develop any weakness or numbness of the legs or you are having inability to urinate or you are urinating on yourself accidentally you must return to the emergency department immediately

## 2021-02-01 NOTE — ED Notes (Signed)
Pt ambulated to restroom. 

## 2021-02-01 NOTE — ED Provider Notes (Signed)
[facility] EMERGENCY DEPARTMENT Provider Note   CSN: [id number] Arrival date & time: 02/01/21  2423     History Chief Complaint  Patient presents with  . Back Pain    Lance Erickson is a 60 y.o. male.  Patient with a complaint of lumbar back pain for about 6 months.  Patient tells me has been worse in the last month.  Patient was seen here in the emergency department on March 6 for shortness of breath.  Had a CT scan of the chest done to rule out PE which was negative for PE but showed evidence of a significantly enlarging T12 lytic lesion.  Have been present in the past but have been getting much bigger.  Patient states that the pain is bilateral and radiates down to the level of both knees.  Does not involve his feet no numbness or weakness in the feet.  No incontinence.  Patient's past medical history significant for spina bifida.  And for and for a type of leukemia.  Patient is followed by hematology oncology does get injections.  Patient last seen by hematology oncology in the fall formally.  But has follow-up scheduled for next month.  Is seen regularly in the infusion clinic.  Patient during the visit on March 6 was instructed to follow-up with primary care doctor about this new lesion.  Sounds like there was some confusion where patient did not understand.  Patient appears comfortable here.  But does describe pain.        Past Medical History:  Diagnosis Date  . Cancer ([place]) 08/2015   skin, nose  . Leukemia ([place])   . Spinal bifida, closed     Patient Active Problem List   Diagnosis Date Noted  . Anemia, iron deficiency 06/11/2020  . Lymphoproliferative disease ([place]) 11/16/2017  . Spina bifida ([place]) 05/02/2015  . Annual physical exam 05/02/2015    Past Surgical History:  Procedure Laterality Date  . FRACTURE SURGERY     left arm   . INGUINAL HERNIA REPAIR Right        Family History  Problem Relation Age of Onset  . Hypertension Mother   . Deep vein  thrombosis Mother   . Hyperlipidemia Mother   . Cancer Mother        skin  . Heart attack Father   . Diabetes Brother   . Hypertension Brother   . Hyperlipidemia Brother   . Heart attack Brother   . Arthritis Brother        back pain  . Arthritis Brother        back pain  . Cancer Maternal Grandmother        varian and uterus cancer  . Heart attack Maternal Grandmother   . Alcohol abuse Maternal Grandfather   . Tuberculosis Maternal Grandfather   . Heart disease Paternal Grandmother   . Pneumonia Paternal Grandfather     Social History   Tobacco Use  . Smoking status: Never Smoker  . Smokeless tobacco: Never Used  Vaping Use  . Vaping Use: Never used  Substance Use Topics  . Alcohol use: No  . Drug use: No    Home Medications Prior to Admission medications   Medication Sig Start Date End Date Taking? Authorizing Provider  traMADol (ULTRAM) 50 MG tablet Take 1 tablet (50 mg total) by mouth every 6 (six) hours as needed. 02/01/21  Yes Fredia Sorrow, MD  acetaminophen (TYLENOL) 500 MG tablet Take 1,000 mg by mouth every 6 (  six) hours as needed.    [provider]    Allergies    Patient has no known allergies.  Review of Systems   Review of Systems  Constitutional: Negative for chills and fever.  HENT: Negative for rhinorrhea and sore throat.   Eyes: Negative for visual disturbance.  Respiratory: Negative for cough and shortness of breath.   Cardiovascular: Negative for chest pain and leg swelling.  Gastrointestinal: Negative for abdominal pain, diarrhea, nausea and vomiting.  Genitourinary: Negative for dysuria.  Musculoskeletal: Positive for back pain. Negative for neck pain.  Skin: Negative for rash.  Neurological: Negative for dizziness, light-headedness and headaches.  Hematological: Does not bruise/bleed easily.  Psychiatric/Behavioral: Negative for confusion.    Physical Exam Updated Vital Signs BP (!) 149/78 (BP Location: Left Arm)    Pulse 87   Temp 98.3 F (36.8 C)   Resp 18   Ht 1.778 m (5\' 10" )   Wt 68 kg   SpO2 97%   BMI 21.52 kg/m   Physical Exam Vitals and nursing note reviewed.  Constitutional:      Appearance: Normal appearance. He is well-developed.  HENT:     Head: Normocephalic and atraumatic.  Eyes:     Extraocular Movements: Extraocular movements intact.     Conjunctiva/sclera: Conjunctivae normal.     Pupils: Pupils are equal, round, and reactive to light.  Cardiovascular:     Rate and Rhythm: Normal rate and regular rhythm.     Heart sounds: No murmur heard.   Pulmonary:     Effort: Pulmonary effort is normal. No respiratory distress.     Breath sounds: Normal breath sounds.  Abdominal:     Palpations: Abdomen is soft.     Tenderness: There is no abdominal tenderness.  Musculoskeletal:        General: Normal range of motion.     Cervical back: Normal range of motion and neck supple.     Comments: Good movement of both lower extremities.  Good strength in the feet ankle knee and hip area.  Good cap refill to the toes.  No significant swelling.  Sensations intact to the feet.  Skin:    General: Skin is warm and dry.     Capillary Refill: Capillary refill takes less than 2 seconds.  Neurological:     General: No focal deficit present.     Mental Status: He is alert and oriented to person, place, and time.     Cranial Nerves: No cranial nerve deficit.     Sensory: No sensory deficit.     Motor: No weakness.     ED Results / Procedures / Treatments   Labs (all labs ordered are listed, but only abnormal results are displayed) Labs Reviewed  COMPREHENSIVE METABOLIC PANEL - Abnormal; Notable for the following components:      Result Value   Glucose, Bld 103 (*)    BUN 22 (*)    Calcium 8.6 (*)    Total Protein 8.6 (*)    Albumin 3.1 (*)    All other components within normal limits  CBC WITH DIFFERENTIAL/PLATELET - Abnormal; Notable for the following components:   WBC 60.1 (*)     RBC 3.30 (*)    Hemoglobin 9.6 (*)    HCT 31.4 (*)    RDW 18.2 (*)    Platelets 112 (*)    Lymphs Abs 57.1 (*)    Eosinophils Absolute 0.6 (*)    All other components within normal limits  MAGNESIUM    EKG None  Radiology No results found.  Procedures Procedures   Medications Ordered in ED Medications  HYDROcodone-acetaminophen (NORCO/VICODIN) 5-325 MG per tablet 1 tablet (1 tablet Oral Given 02/01/21 1338)    ED Course  I have reviewed the triage vital signs and the nursing notes.  Pertinent labs & imaging results that were available during my care of the patient were reviewed by me and considered in my medical decision making (see chart for details).    MDM Rules/Calculators/A&P                          Patient had CT of the chest which showed the thoracic area up to do CT of the lumbar area here today.  That shows evidence of T11T12 fairly significant lytic lesion.  Also a sacral lytic lesion and a lytic lesion in the ilium of the pelvis.  Patient's lab work without significant abnormalities as far as his white blood cell count goes.  Contacted Dr. Delton Coombes from hematology and oncology clinic.  He recommends go ahead and go forward with the MRI of thoracic and lumbar area with and without contrast.  And that he plans to see him in the clinic on Monday.  If the MRI does not show anything significant as far as spinal cord impingement or hemorrhage.  Patient can be discharged home prescription put in for tramadol.     Final Clinical Impression(s) / ED Diagnoses Final diagnoses:  Acute bilateral low back pain without sciatica  Lytic bone lesions on xray    Rx / DC Orders ED Discharge Orders         Ordered    traMADol (ULTRAM) 50 MG tablet  Every 6 hours PRN        02/01/21 1416           Fredia Sorrow, MD 02/01/21 1423

## 2021-02-01 NOTE — ED Triage Notes (Signed)
Pt c/o of lower back pain that has been going on for over 6 months but worsening since yesterday. Denies any new injuries. Taking a "shot" every 6 months for his bones and thinks the pain is from the medication

## 2021-02-04 ENCOUNTER — Inpatient Hospital Stay (HOSPITAL_BASED_OUTPATIENT_CLINIC_OR_DEPARTMENT_OTHER): Payer: Medicare Other | Admitting: Hematology

## 2021-02-04 ENCOUNTER — Other Ambulatory Visit: Payer: Self-pay

## 2021-02-04 ENCOUNTER — Inpatient Hospital Stay (HOSPITAL_COMMUNITY): Payer: Medicare Other

## 2021-02-04 VITALS — BP 144/70 | HR 74 | Temp 97.3°F | Resp 18 | Wt 150.6 lb

## 2021-02-04 DIAGNOSIS — D509 Iron deficiency anemia, unspecified: Secondary | ICD-10-CM | POA: Diagnosis not present

## 2021-02-04 DIAGNOSIS — C8310 Mantle cell lymphoma, unspecified site: Secondary | ICD-10-CM | POA: Diagnosis not present

## 2021-02-04 DIAGNOSIS — M8580 Other specified disorders of bone density and structure, unspecified site: Secondary | ICD-10-CM | POA: Diagnosis not present

## 2021-02-04 LAB — LACTATE DEHYDROGENASE: LDH: 146 U/L (ref 98–192)

## 2021-02-04 NOTE — Patient Instructions (Addendum)
[place] at [facility] Discharge Instructions  You were seen today by Dr. Delton Coombes. He went over your recent results and scans; the MRI shows a fracture in your T11 vertebral body. You had labs drawn for further analysis. You will be referred to interventional radiology to have a biopsy done of your fractured vertebral body. You will also be scheduled to have a PET scan done before your next visit. You will be referred to a radiation oncologist in [place] for radiation to your spine. Dr. Delton Coombes will see you back after the biopsy results for follow up.   Thank you for choosing [place] at [facility] to provide your oncology and hematology care.  To afford each patient quality time with our provider, please arrive at least 15 minutes before your scheduled appointment time.   If you have a lab appointment with the [facility] please come in thru the Main Entrance and check in at the main information desk  You need to re-schedule your appointment should you arrive 10 or more minutes late.  We strive to give you quality time with our providers, and arriving late affects you and other patients whose appointments are after yours.  Also, if you no show three or more times for appointments you may be dismissed from the clinic at the providers discretion.     Again, thank you for choosing [facility].  Our hope is that these requests will decrease the amount of time that you wait before being seen by our physicians.       _____________________________________________________________  Should you have questions after your visit to [facility], please contact our office at [phone] between the hours of 8:00 a.m. and 4:30 p.m.  Voicemails left after 4:00 p.m. will not be returned until the following business day.  For prescription refill requests, have your pharmacy contact our office and allow 72 hours.    Cancer Center  Support Programs:   > Cancer Support Group  2nd Tuesday of the month 1pm-2pm, Journey Room

## 2021-02-04 NOTE — Progress Notes (Signed)
Lance Erickson, [place] [id number]   CLINIC:  Medical Oncology/Hematology  PCP:  [name], MD [address] [place] [address]  [phone]  REASON FOR VISIT:  Follow-up for indolent mantle cell lymphoma and IDA  PRIOR THERAPY: None  CURRENT THERAPY: Iron tablets daily; intermittent Feraheme last on 12/11/2020  INTERVAL HISTORY:  Lance Erickson, a 60 y.o. male, returns for routine follow-up for his indolent mantle cell lymphoma and IDA. Lance Erickson was last seen on 11/27/2020.  Today he is accompanied by his mother and he reports feeling well. He complains of having pain in his lower mid-back for the past 6 months and in his knees which is chronic, but denies leg weakness or incontinence. He started taking Decadron 2 mg BID and tramadol which is helping control the pain. He denies having any falls and is able to ambulate. He denies having F/C or night sweats.   REVIEW OF SYSTEMS:  Review of Systems  Constitutional: Positive for fatigue (75%). Negative for appetite change.  Cardiovascular: Positive for chest pain (5/10 pain).  Musculoskeletal: Positive for arthralgias (knees pain) and back pain (5/10 lower back pain).  Neurological: Negative for extremity weakness.  All other systems reviewed and are negative.   PAST MEDICAL/SURGICAL HISTORY:  Past Medical History:  Diagnosis Date  . Cancer ([place]) 08/2015   skin, nose  . Leukemia ([place])   . Spinal bifida, closed    Past Surgical History:  Procedure Laterality Date  . FRACTURE SURGERY     left arm   . INGUINAL HERNIA REPAIR Right     SOCIAL HISTORY:  Social History   Socioeconomic History  . Marital status: Single    Spouse name: Not on file  . Number of children: Not on file  . Years of education: Not on file  . Highest education level: Not on file  Occupational History  . Occupation: disabled  Tobacco Use  . Smoking status: Never Smoker  . Smokeless tobacco: Never Used   Vaping Use  . Vaping Use: Never used  Substance and Sexual Activity  . Alcohol use: No  . Drug use: No  . Sexual activity: Not Currently  Other Topics Concern  . Not on file  Social History Narrative  . Not on file   Social Determinants of Health   Financial Resource Strain: Not on file  Food Insecurity: Not on file  Transportation Needs: Not on file  Physical Activity: Not on file  Stress: Not on file  Social Connections: Not on file  Intimate Partner Violence: Not on file    FAMILY HISTORY:  Family History  Problem Relation Age of Onset  . Hypertension Mother   . Deep vein thrombosis Mother   . Hyperlipidemia Mother   . Cancer Mother        skin  . Heart attack Father   . Diabetes Brother   . Hypertension Brother   . Hyperlipidemia Brother   . Heart attack Brother   . Arthritis Brother        back pain  . Arthritis Brother        back pain  . Cancer Maternal Grandmother        varian and uterus cancer  . Heart attack Maternal Grandmother   . Alcohol abuse Maternal Grandfather   . Tuberculosis Maternal Grandfather   . Heart disease Paternal Grandmother   . Pneumonia Paternal Grandfather     CURRENT MEDICATIONS:  Current  Outpatient Medications  Medication Sig Dispense Refill  . acetaminophen (TYLENOL) 500 MG tablet Take 1,000 mg by mouth every 6 (six) hours as needed.    Marland Kitchen dexamethasone (DECADRON) 2 MG tablet Take 1 tablet (2 mg total) by mouth 2 (two) times daily with a meal. 30 tablet 0  . traMADol (ULTRAM) 50 MG tablet Take 1 tablet (50 mg total) by mouth every 6 (six) hours as needed. 15 tablet 0   No current facility-administered medications for this visit.    ALLERGIES:  No Known Allergies  PHYSICAL EXAM:  Performance status (ECOG): 1 - Symptomatic but completely ambulatory  Vitals:   02/04/21 1419  BP: (!) 144/70  Pulse: 74  Resp: 18  Temp: (!) 97.3 F (36.3 C)  SpO2: 97%   Wt Readings from Last 3 Encounters:  02/04/21 150 lb 9.6 oz  (68.3 kg)  02/01/21 150 lb (68 kg)  01/13/21 155 lb (70.3 kg)   Physical Exam Vitals reviewed.  Constitutional:      Appearance: Normal appearance.  Cardiovascular:     Rate and Rhythm: Normal rate and regular rhythm.     Pulses: Normal pulses.     Heart sounds: Normal heart sounds.  Pulmonary:     Effort: Pulmonary effort is normal.     Breath sounds: Normal breath sounds.  Chest:  Breasts:     Right: Axillary adenopathy present. No supraclavicular adenopathy.     Left: No axillary adenopathy or supraclavicular adenopathy.    Abdominal:     Palpations: Abdomen is soft. There is no hepatomegaly, splenomegaly or mass.     Tenderness: There is no abdominal tenderness.     Hernia: No hernia is present.  Musculoskeletal:     Right lower leg: No edema.     Left lower leg: No edema.  Lymphadenopathy:     Cervical: No cervical adenopathy.     Upper Body:     Right upper body: Axillary adenopathy present. No supraclavicular adenopathy.     Left upper body: No supraclavicular, axillary or pectoral adenopathy.     Lower Body: No right inguinal adenopathy. No left inguinal adenopathy.  Neurological:     General: No focal deficit present.     Mental Status: He is alert and oriented to person, place, and time.  Psychiatric:        Mood and Affect: Mood normal.        Behavior: Behavior normal.     LABORATORY DATA:  I have reviewed the labs as listed.  CBC Latest Ref Rng & Units 02/01/2021 01/22/2021 01/13/2021  WBC 4.0 - 10.5 K/uL 60.1(HH) 83.0(HH) 50.5(HH)  Hemoglobin 13.0 - 17.0 g/dL 9.6(L) 10.0(L) 10.0(L)  Hematocrit 39.0 - 52.0 % 31.4(L) 33.7(L) 33.0(L)  Platelets 150 - 400 K/uL 112(L) 120(L) 70(L)   CMP Latest Ref Rng & Units 02/01/2021 01/22/2021 01/13/2021  Glucose 70 - 99 mg/dL 103(H) 101(H) 117(H)  BUN 6 - 20 mg/dL 22(H) 28(H) 41(H)  Creatinine 0.61 - 1.24 mg/dL 1.17 1.30(H) 1.63(H)  Sodium 135 - 145 mmol/L 139 139 140  Potassium 3.5 - 5.1 mmol/L 4.1 4.0 4.6  Chloride 98  - 111 mmol/L 108 104 107  CO2 22 - 32 mmol/L 22 25 23   Calcium 8.9 - 10.3 mg/dL 8.6(L) 9.7 9.5  Total Protein 6.5 - 8.1 g/dL 8.6(H) 8.8(H) 8.5(H)  Total Bilirubin 0.3 - 1.2 mg/dL 0.7 0.4 0.7  Alkaline Phos 38 - 126 U/L 53 50 58  AST 15 - 41 U/L 21 22  18  ALT 0 - 44 U/L 13 17 14       Component Value Date/Time   RBC 3.30 (L) 02/01/2021 0835   MCV 95.2 02/01/2021 0835   MCV 85 10/08/2017 0950   MCH 29.1 02/01/2021 0835   MCHC 30.6 02/01/2021 0835   RDW 18.2 (H) 02/01/2021 0835   RDW 14.7 10/08/2017 0950   LYMPHSABS 57.1 (H) 02/01/2021 0835   LYMPHSABS 11.2 (H) 10/08/2017 0950   MONOABS 0.6 02/01/2021 0835   EOSABS 0.6 (H) 02/01/2021 0835   EOSABS 0.1 10/08/2017 0950   BASOSABS 0.0 02/01/2021 0835   BASOSABS 0.1 10/08/2017 0950    DIAGNOSTIC IMAGING:  I have independently reviewed the scans and discussed with the patient. CT Angio Chest PE W and/or Wo Contrast  Result Date: 01/13/2021 CLINICAL DATA:  Chest pain. EXAM: CT ANGIOGRAPHY CHEST WITH CONTRAST TECHNIQUE: Multidetector CT imaging of the chest was performed using the standard protocol during bolus administration of intravenous contrast. Multiplanar CT image reconstructions and MIPs were obtained to evaluate the vascular anatomy. CONTRAST:  172mL OMNIPAQUE IOHEXOL 350 MG/ML SOLN COMPARISON:  CT images from a prior nuclear medicine PET/CT, dated Apr 02, 2020 are available. FINDINGS: Cardiovascular: Stable, approximately 3.6 cm x 4.2 cm dilatation of the descending aortic arch is noted. There is no evidence of associated dissection. Satisfactory opacification of the pulmonary arteries to the segmental level. No evidence of pulmonary embolism. There is moderate severity cardiomegaly. No pericardial effusion. Mediastinum/Nodes: There is mild pretracheal and AP window lymphadenopathy. Thyroid gland, trachea, and esophagus demonstrate no significant findings. Lungs/Pleura: Mild linear scarring and/or atelectasis is seen within the bilateral  lower lobes, left greater than right. There is no evidence of a pleural effusion or pneumothorax. Upper Abdomen: The spleen is markedly enlarged. Musculoskeletal: The osseous structures are diffusely mottled in appearance. A 4.5 cm x 5.2 cm expansile lytic lesion is seen within the posterior aspect of the T12 vertebral body. This is increased in size when compared to the prior study. Review of the MIP images confirms the above findings. IMPRESSION: 1. Stable dilatation of the descending aortic arch without evidence of associated dissection. 2. Moderate severity cardiomegaly. 3. Marked splenomegaly. 4. Expansile lytic lesion within the T12 vertebral body, increased in size compared to the prior study this area is hypermetabolic on the prior nuclear medicine PET/CT and may represent sequelae associated with an underlying neoplastic process. Electronically Signed   By: Virgina Norfolk M.D.   On: 01/13/2021 23:16   CT Lumbar Spine Wo Contrast  Result Date: 02/01/2021 CLINICAL DATA:  Patient with a history of lymphoma. Low back pain for over 6 months which acutely worsened yesterday. EXAM: CT LUMBAR SPINE WITHOUT CONTRAST TECHNIQUE: Multidetector CT imaging of the lumbar spine was performed without intravenous contrast administration. Multiplanar CT image reconstructions were also generated. COMPARISON:  CT chest 01/13/2021.  PET CT scan 04/02/2020. FINDINGS: Segmentation: Standard. Alignment: 0.6 cm anterolisthesis L4 on L5 is due to facet arthropathy. Vertebrae: Bones are markedly heterogeneous throughout. There is partial visualization of a lytic lesion in the right pedicle and facet of T12. A second lytic lesion is seen in the posterior margin L1 in the right pedicle and facet of L1. A large lytic lesion is also identified in the right sacrum. Small lytic lesions are seen in the posterior aspect of the iliac wings bilaterally. No fracture is identified. Paraspinal and other soft tissues: Punctate nonobstructing  stones are noted in the lower pole of the right kidney. Otherwise negative. Disc levels:  Scattered facet degenerative disease appears worst at L4-5. There is also a shallow disc bulge at L4-5 but the central canal and foramina appear open. Minimal disc bulge L5-S1 without stenosis also noted. IMPRESSION: Lytic lesions best seen in the right pedicles and facets at T12 and L1, right sacrum and posterior ilium highly worrisome for recurrent tumor. The patient has a large destructive lesion in the T11 vertebral body on CT angiogram of the chest 01/13/2021. This level is not included on the study. If there is concern for epidural tumor and myelopathy, MRI of the thoracic spine with and without contrast could be used for further evaluation. No central canal stenosis is seen on this study. Negative for fracture. Electronically Signed   By: Inge Rise M.D.   On: 02/01/2021 09:49   MR THORACIC SPINE W WO CONTRAST  Result Date: 02/01/2021 CLINICAL DATA:  Back pain.  History of lymphoma. EXAM: MRI THORACIC AND LUMBAR SPINE WITHOUT AND WITH CONTRAST TECHNIQUE: Multiplanar and multiecho pulse sequences of the thoracic and lumbar spine were obtained without and with intravenous contrast. CONTRAST:  69mL GADAVIST GADOBUTROL 1 MMOL/ML IV SOLN COMPARISON:  CT lumbar spine from same day. CTA chest dated January 13, 2021. FINDINGS: MRI THORACIC SPINE FINDINGS Alignment:  Physiologic. Vertebrae: Diffusely decreased marrow signal. Enhancing tumor nearly completely replacing the T11 vertebral body with extension into the posterior elements. Moderate height loss and prominent bulging of the posterior cortex and anterior epidural tumor extension with moderate to severe spinal canal stenosis (series 22, image 27). Numerous additional small enhancing lesions involving the thoracic vertebral bodies and posterior elements as well as the bilateral ribs. Cord:  Normal signal and morphology.  No intradural enhancement. Paraspinal and other  soft tissues: Trace left pleural effusion. Bilateral lower lobe atelectasis. Disc levels: No significant disc bulge or herniation. No additional spinal canal stenosis. No neuroforaminal stenosis. MRI LUMBAR SPINE FINDINGS Segmentation:  Standard. Alignment:  Unchanged 5 mm anterolisthesis at L4-L5. Vertebrae: Diffusely decreased marrow signal. Multiple scattered enhancing lesions involving the lumbar vertebral bodies and posterior elements, sacrum, and bilateral iliac bones. The largest lesion is located in the right sacral ala and measures 2.5 cm. No acute fracture or evidence of discitis. Chronic mild L5 inferior endplate compression deformity. Conus medullaris: Extends to the L2 level and appears normal. No intradural enhancement. Paraspinal and other soft tissues: Negative. Disc levels: T12-L1:  Negative. L1-L2:  Negative. L2-L3: Minimal disc bulging. Mild bilateral facet arthropathy. No stenosis. L3-L4: Small right foraminal disc protrusion. Mild bilateral facet arthropathy. Mild right neuroforaminal stenosis. L4-L5: Minimal disc bulging. Moderate bilateral facet arthropathy. Mild bilateral neuroforaminal stenosis. L5-S1: Negative disc. Mild bilateral facet arthropathy. No stenosis. IMPRESSION: 1. Multifocal tumor involvement of the thoracolumbar spine, pelvis, and ribs. Enhancing tumor nearly completely replacing the T11 vertebral body with moderate pathologic compression fracture, prominent bulging of the posterior cortex, and anterior epidural tumor extension causing moderate to severe spinal canal stenosis. No abnormal cord signal. 2. Multilevel lumbar spondylosis as described above without high-grade stenosis or impingement. Electronically Signed   By: Titus Dubin M.D.   On: 02/01/2021 16:09   MR Lumbar Spine W Wo Contrast  Result Date: 02/01/2021 CLINICAL DATA:  Back pain.  History of lymphoma. EXAM: MRI THORACIC AND LUMBAR SPINE WITHOUT AND WITH CONTRAST TECHNIQUE: Multiplanar and multiecho pulse  sequences of the thoracic and lumbar spine were obtained without and with intravenous contrast. CONTRAST:  33mL GADAVIST GADOBUTROL 1 MMOL/ML IV SOLN COMPARISON:  CT lumbar spine from same day.  CTA chest dated January 13, 2021. FINDINGS: MRI THORACIC SPINE FINDINGS Alignment:  Physiologic. Vertebrae: Diffusely decreased marrow signal. Enhancing tumor nearly completely replacing the T11 vertebral body with extension into the posterior elements. Moderate height loss and prominent bulging of the posterior cortex and anterior epidural tumor extension with moderate to severe spinal canal stenosis (series 22, image 27). Numerous additional small enhancing lesions involving the thoracic vertebral bodies and posterior elements as well as the bilateral ribs. Cord:  Normal signal and morphology.  No intradural enhancement. Paraspinal and other soft tissues: Trace left pleural effusion. Bilateral lower lobe atelectasis. Disc levels: No significant disc bulge or herniation. No additional spinal canal stenosis. No neuroforaminal stenosis. MRI LUMBAR SPINE FINDINGS Segmentation:  Standard. Alignment:  Unchanged 5 mm anterolisthesis at L4-L5. Vertebrae: Diffusely decreased marrow signal. Multiple scattered enhancing lesions involving the lumbar vertebral bodies and posterior elements, sacrum, and bilateral iliac bones. The largest lesion is located in the right sacral ala and measures 2.5 cm. No acute fracture or evidence of discitis. Chronic mild L5 inferior endplate compression deformity. Conus medullaris: Extends to the L2 level and appears normal. No intradural enhancement. Paraspinal and other soft tissues: Negative. Disc levels: T12-L1:  Negative. L1-L2:  Negative. L2-L3: Minimal disc bulging. Mild bilateral facet arthropathy. No stenosis. L3-L4: Small right foraminal disc protrusion. Mild bilateral facet arthropathy. Mild right neuroforaminal stenosis. L4-L5: Minimal disc bulging. Moderate bilateral facet arthropathy. Mild  bilateral neuroforaminal stenosis. L5-S1: Negative disc. Mild bilateral facet arthropathy. No stenosis. IMPRESSION: 1. Multifocal tumor involvement of the thoracolumbar spine, pelvis, and ribs. Enhancing tumor nearly completely replacing the T11 vertebral body with moderate pathologic compression fracture, prominent bulging of the posterior cortex, and anterior epidural tumor extension causing moderate to severe spinal canal stenosis. No abnormal cord signal. 2. Multilevel lumbar spondylosis as described above without high-grade stenosis or impingement. Electronically Signed   By: Titus Dubin M.D.   On: 02/01/2021 16:09   DG Chest Portable 1 View  Result Date: 01/13/2021 CLINICAL DATA:  Right-sided chest pain for 1 month, no known injury, initial encounter EXAM: PORTABLE CHEST 1 VIEW COMPARISON:  04/02/2020 FINDINGS: Cardiac shadow is at the upper limits of normal in size. The lungs are well aerated bilaterally. No focal infiltrate or effusion is seen. Bony structures show no acute abnormality. 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 IMPRESSION: No acute abnormality noted. Electronically Signed   By: Inez Catalina M.D.   On: 01/13/2021 21:01     ASSESSMENT:  1. Indolent mantle cell lymphoma: -BMBX on 12/03/2017 showed hypercellular marrow with involvement by NHL. Bone marrow is predominantly involved by small lymphoid cells expressing pan B cell antigens including CD20, CD5 and cyclin D1 expression. Chromosome analysis shows 53, XY. CLL FISH panel was negative. Minor population of monoclonal, kappa restricted plasma cells are present 2% of cells. -Labs on 03/15/2020 shows M spike of 1.5 g. White count is 39.7. Platelet count is 116. Hemoglobin is 10.6. -PET scan on 04/02/2020 shows left axillary lymphadenopathy, SUV max 2.6. No mediastinal or hilar adenopathy. Right inguinal adenopathy with SUV 1.6. No splenic hypermetabolism. Splenomegaly measuring 15.2 cm. New mild compression deformity at T11  with hypermetabolism, SUV 4.1. There is also new hypermetabolic within the spinous process of T2, SUV 3.8.  2. Osteopenia: -Bone density on 06/04/2020 showed T score of -2.2.   PLAN:  1.Indolent mantle cell lymphoma: -Bilateral axillary adenopathy, right more than left and right inguinal adenopathy is stable. -CBC shows white count is 60.1, predominantly lymphocytes and some eosinophilia.  Platelet count is 112.  LDH was normal. -Recommend whole-body PET CT scan for restaging of lymphoma.  2. Bone lesions: -He had T11 compression deformity, for which Prolia 60 mg every 6 months was initiated on 07/24/2020. -He presented to the ER on Friday with back pain.  I have talked to ER physician and ordered scans. -MRI of the thoracic and lumbar spine on 02/01/2021 showed multifocal tumor involvement of the thoracolumbar spine and pelvis and ribs.  Enhancing tumor near completely replacing the T11 vertebral body with moderate pathological compression fracture.  Prominent bulging of the posterior cortex and anterior epidural tumor extension causing moderate to severe spinal canal stenosis.  No abnormal cord signal. -Recommend biopsy of the T11 tumor. -Recommend radiation therapy to the epidural tumor extension.  We will make a referral to [facility]. -We will check for LDH, SPEP, immunofixation, free light chains, beta-2 microglobulin.  3. Normocytic anemia: -Last Feraheme on 12/04/2020 on 12/11/2020. -CBC on 01/30/2021 shows hemoglobin 9.6.  Orders placed this encounter:  Orders Placed This Encounter  Procedures  . NM PET Image Restag (PS) Skull Base To Thigh  . CT Biopsy  . Lactate dehydrogenase  . Protein electrophoresis, serum  . Immunofixation electrophoresis  . Kappa/lambda light chains  . Beta 2 microglobulin, serum     Derek Jack, MD [place] [phone]   I, Milinda Antis, am acting as a scribe for Dr. Sanda Linger.  I, Derek Jack MD, have reviewed the above documentation for accuracy and completeness, and I agree with the above.

## 2021-02-05 ENCOUNTER — Other Ambulatory Visit: Payer: Self-pay | Admitting: Radiation Therapy

## 2021-02-05 LAB — BETA 2 MICROGLOBULIN, SERUM: Beta-2 Microglobulin: 4.1 mg/L — ABNORMAL HIGH (ref 0.6–2.4)

## 2021-02-05 LAB — IMMUNOFIXATION ELECTROPHORESIS
IgA: 12 mg/dL — ABNORMAL LOW (ref 90–386)
IgG (Immunoglobin G), Serum: 3724 mg/dL — ABNORMAL HIGH (ref 603–1613)
IgM (Immunoglobulin M), Srm: <5 mg/dL — ABNORMAL LOW (ref 20–172)
Total Protein ELP: 9.5 g/dL — ABNORMAL HIGH (ref 6.0–8.5)

## 2021-02-05 LAB — KAPPA/LAMBDA LIGHT CHAINS
Kappa free light chain: 938.7 mg/L — ABNORMAL HIGH (ref 3.3–19.4)
Kappa, lambda light chain ratio: 426.68 — ABNORMAL HIGH (ref 0.26–1.65)
Lambda free light chains: 2.2 mg/L — ABNORMAL LOW (ref 5.7–26.3)

## 2021-02-05 LAB — PROTEIN ELECTROPHORESIS, SERUM
A/G Ratio: 0.7 (ref 0.7–1.7)
Albumin ELP: 3.9 g/dL (ref 2.9–4.4)
Alpha-1-Globulin: 0.4 g/dL (ref 0.0–0.4)
Alpha-2-Globulin: 0.8 g/dL (ref 0.4–1.0)
Beta Globulin: 4 g/dL — ABNORMAL HIGH (ref 0.7–1.3)
Gamma Globulin: 0.1 g/dL — ABNORMAL LOW (ref 0.4–1.8)
Globulin, Total: 5.4 g/dL — ABNORMAL HIGH (ref 2.2–3.9)
M-Spike, %: 3.3 g/dL — ABNORMAL HIGH
Total Protein ELP: 9.3 g/dL — ABNORMAL HIGH (ref 6.0–8.5)

## 2021-02-06 ENCOUNTER — Other Ambulatory Visit (HOSPITAL_COMMUNITY): Payer: Self-pay | Admitting: Physician Assistant

## 2021-02-06 ENCOUNTER — Other Ambulatory Visit (HOSPITAL_COMMUNITY): Payer: Self-pay | Admitting: Family Medicine

## 2021-02-07 ENCOUNTER — Telehealth (HOSPITAL_COMMUNITY): Payer: Self-pay

## 2021-02-07 ENCOUNTER — Other Ambulatory Visit (HOSPITAL_COMMUNITY): Payer: Self-pay | Admitting: Hematology

## 2021-02-07 DIAGNOSIS — D509 Iron deficiency anemia, unspecified: Secondary | ICD-10-CM

## 2021-02-07 DIAGNOSIS — C8310 Mantle cell lymphoma, unspecified site: Secondary | ICD-10-CM

## 2021-02-07 NOTE — Progress Notes (Signed)
Histology and Location of Primary Cancer:  Indolent mantle cell lymphoma   Sites of Visceral and Bony Metastatic Disease:  MRI Thoracic w/ & w/o Contrast --02/01/2021 IMPRESSION: 1. Multifocal tumor involvement of the thoracolumbar spine, pelvis, and ribs. Enhancing tumor nearly completely replacing the T11 vertebral body with moderate pathologic compression fracture, prominent bulging of the posterior cortex, and anterior epidural tumor extension causing moderate to severe spinal canal stenosis. No abnormal cord signal. 2. Multilevel lumbar spondylosis as described above without high-grade stenosis or impingement.  CT Lumbar Spine w/o Contrast --02/01/2021 IMPRESSION: --Lytic lesions best seen in the right pedicles and facets at T12 and L1, right sacrum and posterior ilium highly worrisome for recurrent tumor. The patient has a large destructive lesion in the T11 vertebral body on CT angiogram of the chest 01/13/2021. This level is not included on the study. --If there is concern for epidural tumor and myelopathy, MRI of the thoracic spine with and without contrast could be used for further evaluation. No central canal stenosis is seen on this study. --Negative for fracture.  Past/Anticipated chemotherapy by medical oncology, if any:  Under care of Sreedhar Katragadda 02/04/2021 PLAN:  1.Indolent mantle cell lymphoma: -Bilateral axillary adenopathy, right more than left and right inguinal adenopathy is stable. -CBC shows white count is 60.1, predominantly lymphocytes and some eosinophilia.  Platelet count is 112.  LDH was normal. -Recommend whole-body PET CT scan for restaging of lymphoma (scheduled for 02/14/2021) 2. Bone lesions: -He had T11 compression deformity, for which Prolia 60 mg every 6 months was initiated on 07/24/2020. -He presented to the ER on Friday with back pain.  I have talked to ER physician and ordered scans. -MRI of the thoracic and lumbar spine on 02/01/2021  showed multifocal tumor involvement of the thoracolumbar spine and pelvis and ribs.  Enhancing tumor near completely replacing the T11 vertebral body with moderate pathological compression fracture.  Prominent bulging of the posterior cortex and anterior epidural tumor extension causing moderate to severe spinal canal stenosis.  No abnormal cord signal. -Recommend biopsy of the T11 tumor (scheduled for 02/12/2021) -Recommend radiation therapy to the epidural tumor extension.  We will make a referral to [place]. -We will check for LDH, SPEP, immunofixation, free light chains, beta-2 microglobulin.  Pain on a scale of 0-10 is: Reports 5 out of 10 generalized pain (managing with Tylenol and Tramadol)  If Spine Met(s), symptoms, if any, include:  Bowel/Bladder retention or incontinence (please describe): Patient denies. Reports occasional urinary frequency, but otherwise no changes in urinary/bowel habits  Numbness or weakness in extremities (please describe): Patient denies  Current Decadron regimen, if applicable: 2mg PO twice a day  Ambulatory status? Walker? Wheelchair?: Able to ambulate unassisted   SAFETY ISSUES:  Prior radiation? No  Pacemaker/ICD? No  Possible current pregnancy? N/A  Is the patient on methotrexate? No  Current Complaints / other details:  Patient has received both Moderna vaccines as well as Moderna booster. Scheduled for biopsy tomorrow 02/12/21 and PET scan 02/14/21    

## 2021-02-07 NOTE — Telephone Encounter (Signed)
-----   Message from Pedro Earls, MD sent at 02/07/2021  8:34 AM EDT ----- Regarding: RE: Biopsy Yes. Thanks.   ----- Message ----- From: Danielle Dess Sent: 02/07/2021   8:19 AM EDT To: Pedro Earls, MD, # Subject: FW: Biopsy                                     Kat,   Do you think I could put this T11 biopsy on for you on Tuesday?  Thanks,  Lia Foyer  ----- Message ----- From: Lenore Cordia Sent: 02/07/2021   7:51 AM EDT To: Harlene Salts Subject: FW: Biopsy                                     Greetings and Well Wishes!  ----- Message ----- From: Sandi Mariscal, MD Sent: 02/06/2021   6:41 PM EDT To: Luanne Bras, MD, Arne Cleveland, MD, # Subject: RE: Biopsy                                     Thx for everyone's input regarding this patient.  After discussing with Dr. Zada Finders, I will approve to fluoro guided Bx only (will hold off on Ostecool/RF ablation and cement augmentation for now).  [facility] - please expedite this pt for a fluoro guided T11 vertebral body biopsy.  The procedure can be performed at [facility] by either Drs. Deveshwar or Karenann Cai or by Drs. Fredderick Erb or myself at either [facility] or [facility].  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 Delton Coombes Provider Telephone:  [phone]   Other Info:

## 2021-02-07 NOTE — Telephone Encounter (Signed)
Left a vm for Dr. Tomie China office to return call. Plan is to schedule [name] for T11 biopsy on 4/5. Pt's mother is concerned because he has radiation on 4/4 and thought the biopsy needed to be done prior to that. Will await return call to decide what to do. AW

## 2021-02-11 ENCOUNTER — Ambulatory Visit
Admission: RE | Admit: 2021-02-11 | Discharge: 2021-02-11 | Disposition: A | Discharge status: Home / Self Care | Payer: Medicare Other | Source: Ambulatory Visit | Attending: Radiation Oncology | Admitting: Radiation Oncology

## 2021-02-11 ENCOUNTER — Encounter: Payer: Self-pay | Admitting: Genetic Counselor

## 2021-02-11 ENCOUNTER — Other Ambulatory Visit: Payer: Self-pay

## 2021-02-11 ENCOUNTER — Other Ambulatory Visit: Payer: Self-pay | Admitting: Student

## 2021-02-11 VITALS — BP 143/71 | HR 94 | Temp 97.9°F | Resp 19 | Wt 144.0 lb

## 2021-02-11 DIAGNOSIS — C8310 Mantle cell lymphoma, unspecified site: Secondary | ICD-10-CM | POA: Diagnosis not present

## 2021-02-11 DIAGNOSIS — M5117 Intervertebral disc disorders with radiculopathy, lumbosacral region: Secondary | ICD-10-CM | POA: Diagnosis not present

## 2021-02-11 DIAGNOSIS — R161 Splenomegaly, not elsewhere classified: Secondary | ICD-10-CM | POA: Diagnosis not present

## 2021-02-11 DIAGNOSIS — M47816 Spondylosis without myelopathy or radiculopathy, lumbar region: Secondary | ICD-10-CM | POA: Insufficient documentation

## 2021-02-11 DIAGNOSIS — M5136 Other intervertebral disc degeneration, lumbar region: Secondary | ICD-10-CM | POA: Diagnosis not present

## 2021-02-11 DIAGNOSIS — Z79899 Other long term (current) drug therapy: Secondary | ICD-10-CM | POA: Diagnosis not present

## 2021-02-11 DIAGNOSIS — Q059 Spina bifida, unspecified: Secondary | ICD-10-CM | POA: Diagnosis not present

## 2021-02-11 DIAGNOSIS — R590 Localized enlarged lymph nodes: Secondary | ICD-10-CM | POA: Insufficient documentation

## 2021-02-11 DIAGNOSIS — M48061 Spinal stenosis, lumbar region without neurogenic claudication: Secondary | ICD-10-CM | POA: Diagnosis not present

## 2021-02-11 DIAGNOSIS — C7952 Secondary malignant neoplasm of bone marrow: Secondary | ICD-10-CM

## 2021-02-11 DIAGNOSIS — I517 Cardiomegaly: Secondary | ICD-10-CM | POA: Diagnosis not present

## 2021-02-11 DIAGNOSIS — C7951 Secondary malignant neoplasm of bone: Secondary | ICD-10-CM

## 2021-02-11 DIAGNOSIS — J90 Pleural effusion, not elsewhere classified: Secondary | ICD-10-CM | POA: Insufficient documentation

## 2021-02-11 NOTE — Addendum Note (Signed)
Encounter addended by: Zola Button, RN on: 02/11/2021 7:45 AM  Actions taken: Charge Capture section accepted

## 2021-02-12 ENCOUNTER — Telehealth (HOSPITAL_COMMUNITY): Payer: Self-pay | Admitting: *Deleted

## 2021-02-12 ENCOUNTER — Encounter: Payer: Self-pay | Admitting: General Practice

## 2021-02-12 ENCOUNTER — Encounter: Payer: Self-pay | Admitting: Radiation Oncology

## 2021-02-12 ENCOUNTER — Encounter (HOSPITAL_COMMUNITY): Payer: Self-pay | Admitting: General Practice

## 2021-02-12 ENCOUNTER — Other Ambulatory Visit (HOSPITAL_COMMUNITY): Payer: Self-pay | Admitting: *Deleted

## 2021-02-12 ENCOUNTER — Encounter (HOSPITAL_COMMUNITY): Payer: Self-pay

## 2021-02-12 ENCOUNTER — Ambulatory Visit (HOSPITAL_COMMUNITY)
Admission: RE | Admit: 2021-02-12 | Discharge: 2021-02-12 | Disposition: A | Discharge status: Home / Self Care | Payer: Medicare Other | Source: Ambulatory Visit | Attending: Hematology | Admitting: Hematology

## 2021-02-12 DIAGNOSIS — D509 Iron deficiency anemia, unspecified: Secondary | ICD-10-CM

## 2021-02-12 DIAGNOSIS — M8448XA Pathological fracture, other site, initial encounter for fracture: Secondary | ICD-10-CM | POA: Diagnosis not present

## 2021-02-12 DIAGNOSIS — M4624 Osteomyelitis of vertebra, thoracic region: Secondary | ICD-10-CM | POA: Diagnosis not present

## 2021-02-12 DIAGNOSIS — C8310 Mantle cell lymphoma, unspecified site: Secondary | ICD-10-CM | POA: Diagnosis not present

## 2021-02-12 DIAGNOSIS — C7951 Secondary malignant neoplasm of bone: Secondary | ICD-10-CM | POA: Insufficient documentation

## 2021-02-12 DIAGNOSIS — C8519 Unspecified B-cell lymphoma, extranodal and solid organ sites: Secondary | ICD-10-CM | POA: Diagnosis not present

## 2021-02-12 DIAGNOSIS — Z79899 Other long term (current) drug therapy: Secondary | ICD-10-CM | POA: Diagnosis not present

## 2021-02-12 HISTORY — PX: IR FLUORO GUIDED NEEDLE PLC ASPIRATION/INJECTION LOC: IMG2395

## 2021-02-12 LAB — CBC WITH DIFFERENTIAL/PLATELET
Abs Immature Granulocytes: 0 10*3/uL (ref 0.00–0.07)
Basophils Absolute: 0 10*3/uL (ref 0.0–0.1)
Basophils Relative: 0 %
Eosinophils Absolute: 0 10*3/uL (ref 0.0–0.5)
Eosinophils Relative: 0 %
HCT: 41.7 % (ref 39.0–52.0)
Hemoglobin: 11.5 g/dL — ABNORMAL LOW (ref 13.0–17.0)
Lymphocytes Relative: 90 %
Lymphs Abs: 244.4 10*3/uL — ABNORMAL HIGH (ref 0.7–4.0)
MCH: 27.1 pg (ref 26.0–34.0)
MCHC: 27.6 g/dL — ABNORMAL LOW (ref 30.0–36.0)
MCV: 98.1 fL (ref 80.0–100.0)
Monocytes Absolute: 5.4 10*3/uL — ABNORMAL HIGH (ref 0.1–1.0)
Monocytes Relative: 2 %
Neutro Abs: 21.7 10*3/uL — ABNORMAL HIGH (ref 1.7–7.7)
Neutrophils Relative %: 8 %
Platelets: 188 10*3/uL (ref 150–400)
RBC: 4.25 MIL/uL (ref 4.22–5.81)
RDW: 20.4 % — ABNORMAL HIGH (ref 11.5–15.5)
WBC: 271.6 10*3/uL (ref 4.0–10.5)
nRBC: 0 % (ref 0.0–0.2)

## 2021-02-12 LAB — CBC
HCT: 41.6 % (ref 39.0–52.0)
Hemoglobin: 11.9 g/dL — ABNORMAL LOW (ref 13.0–17.0)
MCH: 28.3 pg (ref 26.0–34.0)
MCHC: 28.6 g/dL — ABNORMAL LOW (ref 30.0–36.0)
MCV: 99 fL (ref 80.0–100.0)
Platelets: 171 10*3/uL (ref 150–400)
RBC: 4.2 MIL/uL — ABNORMAL LOW (ref 4.22–5.81)
RDW: 20.6 % — ABNORMAL HIGH (ref 11.5–15.5)
WBC: 269.2 10*3/uL (ref 4.0–10.5)
nRBC: 0 % (ref 0.0–0.2)

## 2021-02-12 LAB — PROTIME-INR
INR: 1.2 (ref 0.8–1.2)
Prothrombin Time: 14.3 seconds (ref 11.4–15.2)

## 2021-02-12 MED ORDER — SODIUM CHLORIDE 0.9 % IV SOLN
INTRAVENOUS | Status: DC
  Administered 2021-02-12: 10 mL/h via INTRAVENOUS

## 2021-02-12 MED ORDER — MIDAZOLAM HCL 2 MG/2ML IJ SOLN
INTRAMUSCULAR | Status: AC
  Filled 2021-02-12: qty 2

## 2021-02-12 MED ORDER — BUPIVACAINE HCL 0.25 % IJ SOLN
INTRAMUSCULAR | Status: AC | PRN
  Administered 2021-02-12: 15 mL

## 2021-02-12 MED ORDER — LIDOCAINE HCL (PF) 1 % IJ SOLN
INTRAMUSCULAR | Status: AC | PRN
  Administered 2021-02-12: 10 mL

## 2021-02-12 MED ORDER — FENTANYL CITRATE (PF) 100 MCG/2ML IJ SOLN
INTRAMUSCULAR | Status: AC
  Filled 2021-02-12: qty 2

## 2021-02-12 MED ORDER — FENTANYL CITRATE (PF) 100 MCG/2ML IJ SOLN
INTRAMUSCULAR | Status: AC | PRN
  Administered 2021-02-12: 25 ug via INTRAVENOUS

## 2021-02-12 MED ORDER — BUPIVACAINE HCL (PF) 0.5 % IJ SOLN
INTRAMUSCULAR | Status: AC
  Filled 2021-02-12: qty 30

## 2021-02-12 MED ORDER — MIDAZOLAM HCL 2 MG/2ML IJ SOLN
INTRAMUSCULAR | Status: AC | PRN
  Administered 2021-02-12: 1 mg via INTRAVENOUS

## 2021-02-12 MED ORDER — CEFAZOLIN SODIUM-DEXTROSE 2-4 GM/100ML-% IV SOLN
INTRAVENOUS | Status: AC
  Administered 2021-02-12: 2 g via INTRAVENOUS
  Filled 2021-02-12: qty 100

## 2021-02-12 MED ORDER — LIDOCAINE HCL 1 % IJ SOLN
INTRAMUSCULAR | Status: AC
  Filled 2021-02-12: qty 20

## 2021-02-12 MED ORDER — CEFAZOLIN SODIUM-DEXTROSE 2-4 GM/100ML-% IV SOLN: 2.0000 g | Freq: Once | INTRAVENOUS | Status: AC

## 2021-02-12 NOTE — Progress Notes (Signed)
[facility] Psychosocial Distress Screening Clinical Social Work  Clinical Social Work was referred by distress screening protocol.  The patient scored a 10 on the Psychosocial Distress Thermometer which indicates severe distress. Clinical Social Worker contacted patient by phone to assess for distress and other psychosocial needs. Patient's main concern is insurance and affording copays/deductibles.  Patient is on disability through his father - he was born w spina bifida and "some brain damage."  Lives with 45 year old mother. He has mother and.or friend who can drive him to/from appointments.  Per mother, "we take care of each other."  He does own a car and drives when he is able.   No current PCP, he was "dropped" by [facility] as he had not been seen there is quite some time.  He considers [facility] as his PCP.   Per mother, "he is not worried about anything, I am the one who is worried."  She is concerned about the financial impact of cancer and cancer treatments.  Advised her to contact local DSS in order to determine if he is eligible for Medicaid in addition to Medicare.   Offered option of [facility] for transport but mother prefers to drive him.  Referred to [place] advocate. Mother was uncertain about other options for additional financial support including Cancer Services, Cancer Care and [facility].  ON[facility]N DISTRESS SCREENING 02/11/2021  Screening Type Initial Screening  Distress experienced in past week (1-10) 10  Practical problem type Insurance  Emotional problem type Boredom  Spiritual/Religous concerns type Relating to God  Physical Problem type Pain;Getting around;Breathing;Changes in urination  Physician notified of physical symptoms Yes  Referral to clinical psychology No  Referral to clinical social work Yes  Referral to dietition No  Referral to financial advocate Yes  Referral to support programs Yes  Referral to  palliative care No    Clinical Social Worker follow up needed: Yes.   will see patient in person at next [facility] appointment  If yes, follow up plan: see above  Beverely Pace, [place], [employer] Worker Phone:  [phone]

## 2021-02-12 NOTE — Procedures (Signed)
INTERVENTIONAL NEURORADIOLOGY BRIEF POSTPROCEDURE NOTE  FLUOROSCOPY GUIDED FNA AND CORE BIOPSY  Attending: Dr. Pedro Earls  Assistant: None.  Diagnosis: Pathologic fracture  Access site: Percutaneous right transpedicular  Anesthesia: Moderate sedation  Medication used: 1 mg Versed IV; 25 mcg Fentanyl IV.  Complications: None.  Estimated blood loss: Negligible.  Specimen: 1 FNA and 1 core biopsy T11 vertebra. Both samples appear bloody. Needle redirected, but very soft consistency was simmilar.   Findings: Fracture of the T11 vertebral body.   The patient tolerated the procedure well without incident or complication and is in stable condition.

## 2021-02-12 NOTE — Progress Notes (Signed)
[facility] [facility] Spiritual Care Note  Referred by Willodean Rosenthal for emotional support for Charlton and his mom. Missed [place] by phone, but caught his mother Martin Majestic, who was welcoming of opportunity to share and process her worries about Khali's diagnosis and the cost of treatment. Given the busyness of Terrill's schedule this week, I plan to phone him again next week.   [place], [place], [facility] Pager [phone] Voicemail [phone]

## 2021-02-12 NOTE — Sedation Documentation (Signed)
Called to short stay for hand off report. Unit unable to receive report at this time. Awaiting call back.

## 2021-02-12 NOTE — Progress Notes (Addendum)
Radiation Oncology         [phone] ________________________________  Initial outpatient Consultation  Name: Lance Erickson MRN: [id number]  Date: 02/11/2021  DOB: 26-Jul-1961  BX:IDHWYS, Lance Gash, MD  Derek Jack, MD   REFERRING PHYSICIAN: Derek Jack, MD  DIAGNOSIS:    ICD-10-CM   1. Mantle cell lymphoma, unspecified body region [facility])  C83.10 Ambulatory referral to Social Work  2. Spine metastasis ([place])  C79.51 Ambulatory referral to Social Work  3. Secondary malignant neoplasm of bone and bone marrow ([facility])  C79.51    C79.52      CHIEF COMPLAINT: Here to discuss management of spine cancer  HISTORY OF PRESENT ILLNESS::Lance Erickson is a 60 y.o. male who presents with his mother today.  I have spoken about him personally with medical oncology, Dr. Delton Coombes.  As well this this morning at our brain and spine tumor board.  He has been followed (by observation) with Dr. Delton Coombes for Indolent mantle cell lymphoma Notably the patient also has a history of fused spina bifida.   More recently he developed back pain in the lower thoracic region.  He had a known history of a fracture at T11 which was initially felt to be benign on imaging.  Recent MRI now demonstrates progressive changes in that region with epidural tumor extension, now indicating that this is likely malignant, favored to either related to multiple myeloma or lymphoma.  He denies any neurologic deficits.  He also reports less severe pain in the left lower parasternal region.  PET scan is pending.  Biopsy will take place tomorrow.  Neurosurgery and IR have decided not to perform any vertebroplasty due to potential risks.  I have personally reviewed his images.  Sites of Visceral and Bony Metastatic Disease:  MRI Thoracic w/ & w/o Contrast --02/01/2021 IMPRESSION: 1. Multifocal tumor involvement of the thoracolumbar spine, pelvis, and ribs. Enhancing tumor nearly completely replacing the T11 vertebral body  with moderate pathologic compression fracture, prominent bulging of the posterior cortex, and anterior epidural tumor extension causing moderate to severe spinal canal stenosis. No abnormal cord signal. 2. Multilevel lumbar spondylosis as described above without high-grade stenosis or impingement.  CT Lumbar Spine w/o Contrast --02/01/2021 IMPRESSION: --Lytic lesions best seen in the right pedicles and facets at T12 and L1, right sacrum and posterior ilium highly worrisome for recurrent tumor. The patient has a large destructive lesion in the T11 vertebral body on CT angiogram of the chest 01/13/2021. This level is not included on the study. --If there is concern for epidural tumor and myelopathy, MRI of the thoracic spine with and without contrast could be used for further evaluation. No central canal stenosis is seen on this study.   Pain on a scale of 0-10 is: Reports 5 out of 10 generalized pain (managing with Tylenol and Tramadol)  If Spine Met(s), symptoms, if any, include:  Bowel/Bladder retention or incontinence (please describe): Patient denies. Reports occasional urinary frequency, but otherwise no changes in urinary/bowel habits  Numbness or weakness in extremities (please describe): Patient denies  Current Decadron regimen, if applicable: 73m PO twice a day (this has helped his back pain)  Ambulatory status? Walker? Wheelchair?: Able to ambulate unassisted   SAFETY ISSUES:  Prior radiation? No  Pacemaker/ICD? No  Possible current pregnancy? N/A  Is the patient on methotrexate? No  Current Complaints / other details:  Patient has received both Moderna vaccines as well as Moderna booster. Scheduled for biopsy tomorrow 02/12/21 and PET scan 02/14/21  PREVIOUS RADIATION THERAPY: No  PAST MEDICAL HISTORY:  has a past medical history of Cancer ([place]) (08/2015), Leukemia ([place]), and Spinal bifida, closed.    PAST SURGICAL HISTORY: Past Surgical History:  Procedure Laterality  Date  . FRACTURE SURGERY     left arm   . INGUINAL HERNIA REPAIR Right     FAMILY HISTORY: family history includes Alcohol abuse in his maternal grandfather; Arthritis in his brother and brother; Cancer in his maternal grandmother and mother; Deep vein thrombosis in his mother; Diabetes in his brother; Heart attack in his brother, father, and maternal grandmother; Heart disease in his paternal grandmother; Hyperlipidemia in his brother and mother; Hypertension in his brother and mother; Pneumonia in his paternal grandfather; Tuberculosis in his maternal grandfather.  SOCIAL HISTORY:  reports that he has never smoked. He has never used smokeless tobacco. He reports that he does not drink alcohol and does not use drugs.  ALLERGIES: Patient has no known allergies.  MEDICATIONS:  Current Outpatient Medications  Medication Sig Dispense Refill  . acetaminophen (TYLENOL) 500 MG tablet Take 1,000 mg by mouth every 6 (six) hours as needed for mild pain or moderate pain.    Marland Kitchen dexamethasone (DECADRON) 2 MG tablet Take 1 tablet (2 mg total) by mouth 2 (two) times daily with a meal. 30 tablet 0  . traMADol (ULTRAM) 50 MG tablet Take 1 tablet (50 mg total) by mouth every 6 (six) hours as needed. (Patient taking differently: Take 50 mg by mouth 2 (two) times daily.) 15 tablet 0   No current facility-administered medications for this encounter.   Facility-Administered Medications Ordered in Other Encounters  Medication Dose Route Frequency Provider Last Rate Last Admin  . 0.9 %  sodium chloride infusion   Intravenous Continuous Louk, Alexandra M, PA-C        REVIEW OF SYSTEMS:  Notable for that above.   PHYSICAL EXAM:  weight is 144 lb (65.3 kg). His oral temperature is 97.9 F (36.6 C). His blood pressure is 143/71 (abnormal) and his pulse is 94. His respiration is 19 and oxygen saturation is 99%.   General: Alert and oriented, in no acute distress  HEENT: Head is normocephalic. Extraocular movements  are intact. Oropharynx is clear without thrush. Extremities: No cyanosis or edema. Skin: No concerning lesions. Musculoskeletal: symmetric strength and muscle tone throughout.  He is able to ambulate independently with small steps.  Tender to palpation in left lower parasternal region and lower thoracic spinal region Neurologic: Cranial nerves II through XII are grossly intact. No obvious focalities.  Motor function and sensation are symmetric. Speech is fluent.   Psychiatric: Judgment and insight are intact.     ECOG = 1  0 - Asymptomatic (Fully active, able to carry on all predisease activities without restriction)  1 - Symptomatic but completely ambulatory (Restricted in physically strenuous activity but ambulatory and able to carry out work of a light or sedentary nature. For example, light housework, office work)  2 - Symptomatic, <50% in bed during the day (Ambulatory and capable of all self care but unable to carry out any work activities. Up and about more than 50% of waking hours)  3 - Symptomatic, >50% in bed, but not bedbound (Capable of only limited self-care, confined to bed or chair 50% or more of waking hours)  4 - Bedbound (Completely disabled. Cannot carry on any self-care. Totally confined to bed or chair)  5 - Death   Lolita Rieger, Creech RH, Tormey DC, et  al. [phone]). "Toxicity and response criteria of the [facility]". [place] Oncol. 5 (6): 649-55   LABORATORY DATA:  Lab Results  Component Value Date   WBC 269.2 (HH) 02/12/2021   HGB 11.9 (L) 02/12/2021   HCT 41.6 02/12/2021   MCV 99.0 02/12/2021   PLT 171 02/12/2021   CMP     Component Value Date/Time   NA 139 02/01/2021 0835   NA 140 10/08/2017 0950   K 4.1 02/01/2021 0835   CL 108 02/01/2021 0835   CO2 22 02/01/2021 0835   GLUCOSE 103 (H) 02/01/2021 0835   BUN 22 (H) 02/01/2021 0835   BUN 20 10/08/2017 0950   CREATININE 1.17 02/01/2021 0835   CREATININE 0.90 05/12/2013 0958    CALCIUM 8.6 (L) 02/01/2021 0835   PROT 8.6 (H) 02/01/2021 0835   PROT 6.8 10/08/2017 0950   ALBUMIN 3.1 (L) 02/01/2021 0835   ALBUMIN 4.4 10/08/2017 0950   AST 21 02/01/2021 0835   ALT 13 02/01/2021 0835   ALKPHOS 53 02/01/2021 0835   BILITOT 0.7 02/01/2021 0835   BILITOT 0.4 10/08/2017 0950   GFRNONAA >60 02/01/2021 0835   GFRNONAA >89 05/12/2013 0958   GFRAA >60 07/24/2020 1114   GFRAA >89 05/12/2013 0958         RADIOGRAPHY: CT Angio Chest PE W and/or Wo Contrast  Result Date: 01/13/2021 CLINICAL DATA:  Chest pain. EXAM: CT ANGIOGRAPHY CHEST WITH CONTRAST TECHNIQUE: Multidetector CT imaging of the chest was performed using the standard protocol during bolus administration of intravenous contrast. Multiplanar CT image reconstructions and MIPs were obtained to evaluate the vascular anatomy. CONTRAST:  115m OMNIPAQUE IOHEXOL 350 MG/ML SOLN COMPARISON:  CT images from a prior nuclear medicine PET/CT, dated Apr 02, 2020 are available. FINDINGS: Cardiovascular: Stable, approximately 3.6 cm x 4.2 cm dilatation of the descending aortic arch is noted. There is no evidence of associated dissection. Satisfactory opacification of the pulmonary arteries to the segmental level. No evidence of pulmonary embolism. There is moderate severity cardiomegaly. No pericardial effusion. Mediastinum/Nodes: There is mild pretracheal and AP window lymphadenopathy. Thyroid gland, trachea, and esophagus demonstrate no significant findings. Lungs/Pleura: Mild linear scarring and/or atelectasis is seen within the bilateral lower lobes, left greater than right. There is no evidence of a pleural effusion or pneumothorax. Upper Abdomen: The spleen is markedly enlarged. Musculoskeletal: The osseous structures are diffusely mottled in appearance. A 4.5 cm x 5.2 cm expansile lytic lesion is seen within the posterior aspect of the T12 vertebral body. This is increased in size when compared to the prior study. Review of the MIP  images confirms the above findings. IMPRESSION: 1. Stable dilatation of the descending aortic arch without evidence of associated dissection. 2. Moderate severity cardiomegaly. 3. Marked splenomegaly. 4. Expansile lytic lesion within the T12 vertebral body, increased in size compared to the prior study this area is hypermetabolic on the prior nuclear medicine PET/CT and may represent sequelae associated with an underlying neoplastic process. Electronically Signed   By: TVirgina NorfolkM.D.   On: 01/13/2021 23:16   CT Lumbar Spine Wo Contrast  Result Date: 02/01/2021 CLINICAL DATA:  Patient with a history of lymphoma. Low back pain for over 6 months which acutely worsened yesterday. EXAM: CT LUMBAR SPINE WITHOUT CONTRAST TECHNIQUE: Multidetector CT imaging of the lumbar spine was performed without intravenous contrast administration. Multiplanar CT image reconstructions were also generated. COMPARISON:  CT chest 01/13/2021.  PET CT scan 04/02/2020. FINDINGS: Segmentation: Standard. Alignment: 0.6 cm  anterolisthesis L4 on L5 is due to facet arthropathy. Vertebrae: Bones are markedly heterogeneous throughout. There is partial visualization of a lytic lesion in the right pedicle and facet of T12. A second lytic lesion is seen in the posterior margin L1 in the right pedicle and facet of L1. A large lytic lesion is also identified in the right sacrum. Small lytic lesions are seen in the posterior aspect of the iliac wings bilaterally. No fracture is identified. Paraspinal and other soft tissues: Punctate nonobstructing stones are noted in the lower pole of the right kidney. Otherwise negative. Disc levels: Scattered facet degenerative disease appears worst at L4-5. There is also a shallow disc bulge at L4-5 but the central canal and foramina appear open. Minimal disc bulge L5-S1 without stenosis also noted. IMPRESSION: Lytic lesions best seen in the right pedicles and facets at T12 and L1, right sacrum and posterior  ilium highly worrisome for recurrent tumor. The patient has a large destructive lesion in the T11 vertebral body on CT angiogram of the chest 01/13/2021. This level is not included on the study. If there is concern for epidural tumor and myelopathy, MRI of the thoracic spine with and without contrast could be used for further evaluation. No central canal stenosis is seen on this study. Negative for fracture. Electronically Signed   By: Inge Rise M.D.   On: 02/01/2021 09:49   MR THORACIC SPINE W WO CONTRAST  Result Date: 02/01/2021 CLINICAL DATA:  Back pain.  History of lymphoma. EXAM: MRI THORACIC AND LUMBAR SPINE WITHOUT AND WITH CONTRAST TECHNIQUE: Multiplanar and multiecho pulse sequences of the thoracic and lumbar spine were obtained without and with intravenous contrast. CONTRAST:  64m GADAVIST GADOBUTROL 1 MMOL/ML IV SOLN COMPARISON:  CT lumbar spine from same day. CTA chest dated January 13, 2021. FINDINGS: MRI THORACIC SPINE FINDINGS Alignment:  Physiologic. Vertebrae: Diffusely decreased marrow signal. Enhancing tumor nearly completely replacing the T11 vertebral body with extension into the posterior elements. Moderate height loss and prominent bulging of the posterior cortex and anterior epidural tumor extension with moderate to severe spinal canal stenosis (series 22, image 27). Numerous additional small enhancing lesions involving the thoracic vertebral bodies and posterior elements as well as the bilateral ribs. Cord:  Normal signal and morphology.  No intradural enhancement. Paraspinal and other soft tissues: Trace left pleural effusion. Bilateral lower lobe atelectasis. Disc levels: No significant disc bulge or herniation. No additional spinal canal stenosis. No neuroforaminal stenosis. MRI LUMBAR SPINE FINDINGS Segmentation:  Standard. Alignment:  Unchanged 5 mm anterolisthesis at L4-L5. Vertebrae: Diffusely decreased marrow signal. Multiple scattered enhancing lesions involving the lumbar  vertebral bodies and posterior elements, sacrum, and bilateral iliac bones. The largest lesion is located in the right sacral ala and measures 2.5 cm. No acute fracture or evidence of discitis. Chronic mild L5 inferior endplate compression deformity. Conus medullaris: Extends to the L2 level and appears normal. No intradural enhancement. Paraspinal and other soft tissues: Negative. Disc levels: T12-L1:  Negative. L1-L2:  Negative. L2-L3: Minimal disc bulging. Mild bilateral facet arthropathy. No stenosis. L3-L4: Small right foraminal disc protrusion. Mild bilateral facet arthropathy. Mild right neuroforaminal stenosis. L4-L5: Minimal disc bulging. Moderate bilateral facet arthropathy. Mild bilateral neuroforaminal stenosis. L5-S1: Negative disc. Mild bilateral facet arthropathy. No stenosis. IMPRESSION: 1. Multifocal tumor involvement of the thoracolumbar spine, pelvis, and ribs. Enhancing tumor nearly completely replacing the T11 vertebral body with moderate pathologic compression fracture, prominent bulging of the posterior cortex, and anterior epidural tumor extension causing moderate to  severe spinal canal stenosis. No abnormal cord signal. 2. Multilevel lumbar spondylosis as described above without high-grade stenosis or impingement. Electronically Signed   By: Titus Dubin M.D.   On: 02/01/2021 16:09   MR Lumbar Spine W Wo Contrast  Result Date: 02/01/2021 CLINICAL DATA:  Back pain.  History of lymphoma. EXAM: MRI THORACIC AND LUMBAR SPINE WITHOUT AND WITH CONTRAST TECHNIQUE: Multiplanar and multiecho pulse sequences of the thoracic and lumbar spine were obtained without and with intravenous contrast. CONTRAST:  4m GADAVIST GADOBUTROL 1 MMOL/ML IV SOLN COMPARISON:  CT lumbar spine from same day. CTA chest dated January 13, 2021. FINDINGS: MRI THORACIC SPINE FINDINGS Alignment:  Physiologic. Vertebrae: Diffusely decreased marrow signal. Enhancing tumor nearly completely replacing the T11 vertebral body  with extension into the posterior elements. Moderate height loss and prominent bulging of the posterior cortex and anterior epidural tumor extension with moderate to severe spinal canal stenosis (series 22, image 27). Numerous additional small enhancing lesions involving the thoracic vertebral bodies and posterior elements as well as the bilateral ribs. Cord:  Normal signal and morphology.  No intradural enhancement. Paraspinal and other soft tissues: Trace left pleural effusion. Bilateral lower lobe atelectasis. Disc levels: No significant disc bulge or herniation. No additional spinal canal stenosis. No neuroforaminal stenosis. MRI LUMBAR SPINE FINDINGS Segmentation:  Standard. Alignment:  Unchanged 5 mm anterolisthesis at L4-L5. Vertebrae: Diffusely decreased marrow signal. Multiple scattered enhancing lesions involving the lumbar vertebral bodies and posterior elements, sacrum, and bilateral iliac bones. The largest lesion is located in the right sacral ala and measures 2.5 cm. No acute fracture or evidence of discitis. Chronic mild L5 inferior endplate compression deformity. Conus medullaris: Extends to the L2 level and appears normal. No intradural enhancement. Paraspinal and other soft tissues: Negative. Disc levels: T12-L1:  Negative. L1-L2:  Negative. L2-L3: Minimal disc bulging. Mild bilateral facet arthropathy. No stenosis. L3-L4: Small right foraminal disc protrusion. Mild bilateral facet arthropathy. Mild right neuroforaminal stenosis. L4-L5: Minimal disc bulging. Moderate bilateral facet arthropathy. Mild bilateral neuroforaminal stenosis. L5-S1: Negative disc. Mild bilateral facet arthropathy. No stenosis. IMPRESSION: 1. Multifocal tumor involvement of the thoracolumbar spine, pelvis, and ribs. Enhancing tumor nearly completely replacing the T11 vertebral body with moderate pathologic compression fracture, prominent bulging of the posterior cortex, and anterior epidural tumor extension causing  moderate to severe spinal canal stenosis. No abnormal cord signal. 2. Multilevel lumbar spondylosis as described above without high-grade stenosis or impingement. Electronically Signed   By: WTitus DubinM.D.   On: 02/01/2021 16:09   DG Chest Portable 1 View  Result Date: 01/13/2021 CLINICAL DATA:  Right-sided chest pain for 1 month, no known injury, initial encounter EXAM: PORTABLE CHEST 1 VIEW COMPARISON:  04/02/2020 FINDINGS: Cardiac shadow is at the upper limits of normal in size. The lungs are well aerated bilaterally. No focal infiltrate or effusion is seen. Bony structures show no acute abnormality. 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 IMPRESSION: No acute abnormality noted. Electronically Signed   By: MInez CatalinaM.D.   On: 01/13/2021 21:01      IMPRESSION/PLAN: Today, I talked to the patient and his mother about the findings and work-up thus far. We discussed the patient's diagnosis of indolent mantle cell lymphoma, now with evidence of bone cancer, histology pending.  We discussed general treatment for cancer of the bone, highlighting the role of palliative radiotherapy in the management. We discussed the available radiation techniques, and focused on the details of logistics and delivery.    Biopsy and PET  scan are pending.  At minimum I would like the results to his biopsy to determine the most appropriate dose of radiation to treat his lower thoracic spine palliatively.  If he has clear evidence of disease on the PET scan at the left lower parasternal region we can also target that with radiation.  Luckily he does not have any gross neurologic deficits.  That said given the epidural extension I think it is in his best interest for [place] to start the radiation without excessive delays.  We will get him scheduled for a treatment planning session this week and him to start treatment next week.  Anticipate 2 weeks of radiation therapy  We discussed the risks, benefits, and side effects of  radiotherapy. Side effects may include but not necessarily be limited to: Skin irritation, fatigue, GI upset, rare internal organ injury of the chest or abdomen; no guarantees of treatment were given. A consent form was signed and placed in the patient's medical record. The patient was encouraged to ask questions that I answered to the best of my ability. He and his mother are enthusiastic to proceed with palliative treatment.   On date of service, in total, I spent 50 minutes on this encounter. Patient was seen in person.   __________________________________________   Eppie Gibson, MD

## 2021-02-12 NOTE — H&P (Signed)
`     Chief Complaint: Tumor at T11. Team is requesting a T11 biopsy  Referring Physician(s): Katragadda,Sreedhar  Supervising Physician: Pedro Earls  Patient Status: [facility] - Out-pt  History of Present Illness: Lance Erickson is a 60 y.o. male 60 y.o. male, outpatient. History of mantle cell lymphoma with know T11 compression deformity on Prolia every 6 months since 9.14.21. Presented to the ED at [facility] with lumbar back pain X 6 months. Found to have an enlarging T12 lytic lesion while being worked up for a PE at a prior ED visit. MR thoracic and lumbar from 3.25.22 reads Multifocal tumor involvement of the thoracolumbar spine, pelvis, and ribs. Enhancing tumor nearly completely replacing the T11 vertebral body with moderate pathologic compression fracture, prominent bulging of the posterior cortex, and anterior epidural tumor extension causing moderate to severe spinal canal stenosis. No abnormal cord signal. Team is requesting a T11 biopsy for further evaluation.  Currently without any significant complaints. Patient alert and laying in bed, calm and comfortable. Denies any fevers, headache, chest pain, SOB, cough, abdominal pain, nausea, vomiting or bleeding. Patient endorses back pain but states the pain resolved with pain medication. Denies any numbness tingling or loss of sensation to extremities.Return precautions and treatment recommendations and follow-up discussed with the patient who is agreeable with the plan.   Past Medical History:  Diagnosis Date  . Cancer ([place]) 08/2015   skin, nose  . Leukemia ([place])   . Spinal bifida, closed     Past Surgical History:  Procedure Laterality Date  . FRACTURE SURGERY     left arm   . INGUINAL HERNIA REPAIR Right     Allergies: Patient has no known allergies.  Medications: Prior to Admission medications   Medication Sig Start Date End Date Taking? Authorizing Provider  acetaminophen (TYLENOL) 500 MG tablet Take 1,000 mg  by mouth every 6 (six) hours as needed for mild pain or moderate pain.   Yes [provider]  dexamethasone (DECADRON) 2 MG tablet Take 1 tablet (2 mg total) by mouth 2 (two) times daily with a meal. 02/01/21  Yes Noemi Chapel, MD  traMADol (ULTRAM) 50 MG tablet Take 1 tablet (50 mg total) by mouth every 6 (six) hours as needed. Patient taking differently: Take 50 mg by mouth 2 (two) times daily. 02/01/21  Yes Fredia Sorrow, MD     Family History  Problem Relation Age of Onset  . Hypertension Mother   . Deep vein thrombosis Mother   . Hyperlipidemia Mother   . Cancer Mother        skin  . Heart attack Father   . Diabetes Brother   . Hypertension Brother   . Hyperlipidemia Brother   . Heart attack Brother   . Arthritis Brother        back pain  . Arthritis Brother        back pain  . Cancer Maternal Grandmother        ovarian and uterus cancer  . Heart attack Maternal Grandmother   . Alcohol abuse Maternal Grandfather   . Tuberculosis Maternal Grandfather   . Heart disease Paternal Grandmother   . Pneumonia Paternal Grandfather     Social History   Socioeconomic History  . Marital status: Single    Spouse name: Not on file  . Number of children: Not on file  . Years of education: Not on file  . Highest education level: Not on file  Occupational History  .  Occupation: disabled  Tobacco Use  . Smoking status: Never Smoker  . Smokeless tobacco: Never Used  Vaping Use  . Vaping Use: Never used  Substance and Sexual Activity  . Alcohol use: No  . Drug use: No  . Sexual activity: Not Currently  Other Topics Concern  . Not on file  Social History Narrative  . Not on file   Social Determinants of Health   Financial Resource Strain: Not on file  Food Insecurity: Not on file  Transportation Needs: Not on file  Physical Activity: Not on file  Stress: Not on file  Social Connections: Not on file     Review of Systems: A 12 point ROS discussed and  pertinent positives are indicated in the HPI above.  All other systems are negative.  Review of Systems  Constitutional: Negative for fever.  HENT: Negative for congestion.   Respiratory: Negative for cough and shortness of breath.   Cardiovascular: Negative for chest pain.  Gastrointestinal: Negative for abdominal pain.  Musculoskeletal: Positive for back pain ( improved with pain medication taken this morning.).  Neurological: Negative for headaches.  Psychiatric/Behavioral: Negative for behavioral problems and confusion.    Vital Signs: BP (!) 143/77   Pulse 65   Temp 97.7 F (36.5 C) (Oral)   Resp 16   Ht 5\' 10"  (1.778 m)   Wt 150 lb (68 kg)   SpO2 100%   BMI 21.52 kg/m   Physical Exam Vitals and nursing note reviewed.  Constitutional:      Appearance: He is well-developed.  HENT:     Head: Normocephalic.  Cardiovascular:     Rate and Rhythm: Normal rate and regular rhythm.     Heart sounds: Normal heart sounds.  Pulmonary:     Effort: Pulmonary effort is normal.     Breath sounds: Normal breath sounds.  Musculoskeletal:        General: Normal range of motion.     Cervical back: Normal range of motion.  Skin:    General: Skin is dry.  Neurological:     Mental Status: He is alert and oriented to person, place, and time.     Imaging: CT Angio Chest PE W and/or Wo Contrast  Result Date: 01/13/2021 CLINICAL DATA:  Chest pain. EXAM: CT ANGIOGRAPHY CHEST WITH CONTRAST TECHNIQUE: Multidetector CT imaging of the chest was performed using the standard protocol during bolus administration of intravenous contrast. Multiplanar CT image reconstructions and MIPs were obtained to evaluate the vascular anatomy. CONTRAST:  146mL OMNIPAQUE IOHEXOL 350 MG/ML SOLN COMPARISON:  CT images from a prior nuclear medicine PET/CT, dated Apr 02, 2020 are available. FINDINGS: Cardiovascular: Stable, approximately 3.6 cm x 4.2 cm dilatation of the descending aortic arch is noted. There is no  evidence of associated dissection. Satisfactory opacification of the pulmonary arteries to the segmental level. No evidence of pulmonary embolism. There is moderate severity cardiomegaly. No pericardial effusion. Mediastinum/Nodes: There is mild pretracheal and AP window lymphadenopathy. Thyroid gland, trachea, and esophagus demonstrate no significant findings. Lungs/Pleura: Mild linear scarring and/or atelectasis is seen within the bilateral lower lobes, left greater than right. There is no evidence of a pleural effusion or pneumothorax. Upper Abdomen: The spleen is markedly enlarged. Musculoskeletal: The osseous structures are diffusely mottled in appearance. A 4.5 cm x 5.2 cm expansile lytic lesion is seen within the posterior aspect of the T12 vertebral body. This is increased in size when compared to the prior study. Review of the MIP images confirms the  above findings. IMPRESSION: 1. Stable dilatation of the descending aortic arch without evidence of associated dissection. 2. Moderate severity cardiomegaly. 3. Marked splenomegaly. 4. Expansile lytic lesion within the T12 vertebral body, increased in size compared to the prior study this area is hypermetabolic on the prior nuclear medicine PET/CT and may represent sequelae associated with an underlying neoplastic process. Electronically Signed   By: Virgina Norfolk M.D.   On: 01/13/2021 23:16   CT Lumbar Spine Wo Contrast  Result Date: 02/01/2021 CLINICAL DATA:  Patient with a history of lymphoma. Low back pain for over 6 months which acutely worsened yesterday. EXAM: CT LUMBAR SPINE WITHOUT CONTRAST TECHNIQUE: Multidetector CT imaging of the lumbar spine was performed without intravenous contrast administration. Multiplanar CT image reconstructions were also generated. COMPARISON:  CT chest 01/13/2021.  PET CT scan 04/02/2020. FINDINGS: Segmentation: Standard. Alignment: 0.6 cm anterolisthesis L4 on L5 is due to facet arthropathy. Vertebrae: Bones are  markedly heterogeneous throughout. There is partial visualization of a lytic lesion in the right pedicle and facet of T12. A second lytic lesion is seen in the posterior margin L1 in the right pedicle and facet of L1. A large lytic lesion is also identified in the right sacrum. Small lytic lesions are seen in the posterior aspect of the iliac wings bilaterally. No fracture is identified. Paraspinal and other soft tissues: Punctate nonobstructing stones are noted in the lower pole of the right kidney. Otherwise negative. Disc levels: Scattered facet degenerative disease appears worst at L4-5. There is also a shallow disc bulge at L4-5 but the central canal and foramina appear open. Minimal disc bulge L5-S1 without stenosis also noted. IMPRESSION: Lytic lesions best seen in the right pedicles and facets at T12 and L1, right sacrum and posterior ilium highly worrisome for recurrent tumor. The patient has a large destructive lesion in the T11 vertebral body on CT angiogram of the chest 01/13/2021. This level is not included on the study. If there is concern for epidural tumor and myelopathy, MRI of the thoracic spine with and without contrast could be used for further evaluation. No central canal stenosis is seen on this study. Negative for fracture. Electronically Signed   By: Inge Rise M.D.   On: 02/01/2021 09:49   MR THORACIC SPINE W WO CONTRAST  Result Date: 02/01/2021 CLINICAL DATA:  Back pain.  History of lymphoma. EXAM: MRI THORACIC AND LUMBAR SPINE WITHOUT AND WITH CONTRAST TECHNIQUE: Multiplanar and multiecho pulse sequences of the thoracic and lumbar spine were obtained without and with intravenous contrast. CONTRAST:  41mL GADAVIST GADOBUTROL 1 MMOL/ML IV SOLN COMPARISON:  CT lumbar spine from same day. CTA chest dated January 13, 2021. FINDINGS: MRI THORACIC SPINE FINDINGS Alignment:  Physiologic. Vertebrae: Diffusely decreased marrow signal. Enhancing tumor nearly completely replacing the T11  vertebral body with extension into the posterior elements. Moderate height loss and prominent bulging of the posterior cortex and anterior epidural tumor extension with moderate to severe spinal canal stenosis (series 22, image 27). Numerous additional small enhancing lesions involving the thoracic vertebral bodies and posterior elements as well as the bilateral ribs. Cord:  Normal signal and morphology.  No intradural enhancement. Paraspinal and other soft tissues: Trace left pleural effusion. Bilateral lower lobe atelectasis. Disc levels: No significant disc bulge or herniation. No additional spinal canal stenosis. No neuroforaminal stenosis. MRI LUMBAR SPINE FINDINGS Segmentation:  Standard. Alignment:  Unchanged 5 mm anterolisthesis at L4-L5. Vertebrae: Diffusely decreased marrow signal. Multiple scattered enhancing lesions involving the lumbar vertebral bodies  and posterior elements, sacrum, and bilateral iliac bones. The largest lesion is located in the right sacral ala and measures 2.5 cm. No acute fracture or evidence of discitis. Chronic mild L5 inferior endplate compression deformity. Conus medullaris: Extends to the L2 level and appears normal. No intradural enhancement. Paraspinal and other soft tissues: Negative. Disc levels: T12-L1:  Negative. L1-L2:  Negative. L2-L3: Minimal disc bulging. Mild bilateral facet arthropathy. No stenosis. L3-L4: Small right foraminal disc protrusion. Mild bilateral facet arthropathy. Mild right neuroforaminal stenosis. L4-L5: Minimal disc bulging. Moderate bilateral facet arthropathy. Mild bilateral neuroforaminal stenosis. L5-S1: Negative disc. Mild bilateral facet arthropathy. No stenosis. IMPRESSION: 1. Multifocal tumor involvement of the thoracolumbar spine, pelvis, and ribs. Enhancing tumor nearly completely replacing the T11 vertebral body with moderate pathologic compression fracture, prominent bulging of the posterior cortex, and anterior epidural tumor extension  causing moderate to severe spinal canal stenosis. No abnormal cord signal. 2. Multilevel lumbar spondylosis as described above without high-grade stenosis or impingement. Electronically Signed   By: Titus Dubin M.D.   On: 02/01/2021 16:09   MR Lumbar Spine W Wo Contrast  Result Date: 02/01/2021 CLINICAL DATA:  Back pain.  History of lymphoma. EXAM: MRI THORACIC AND LUMBAR SPINE WITHOUT AND WITH CONTRAST TECHNIQUE: Multiplanar and multiecho pulse sequences of the thoracic and lumbar spine were obtained without and with intravenous contrast. CONTRAST:  56mL GADAVIST GADOBUTROL 1 MMOL/ML IV SOLN COMPARISON:  CT lumbar spine from same day. CTA chest dated January 13, 2021. FINDINGS: MRI THORACIC SPINE FINDINGS Alignment:  Physiologic. Vertebrae: Diffusely decreased marrow signal. Enhancing tumor nearly completely replacing the T11 vertebral body with extension into the posterior elements. Moderate height loss and prominent bulging of the posterior cortex and anterior epidural tumor extension with moderate to severe spinal canal stenosis (series 22, image 27). Numerous additional small enhancing lesions involving the thoracic vertebral bodies and posterior elements as well as the bilateral ribs. Cord:  Normal signal and morphology.  No intradural enhancement. Paraspinal and other soft tissues: Trace left pleural effusion. Bilateral lower lobe atelectasis. Disc levels: No significant disc bulge or herniation. No additional spinal canal stenosis. No neuroforaminal stenosis. MRI LUMBAR SPINE FINDINGS Segmentation:  Standard. Alignment:  Unchanged 5 mm anterolisthesis at L4-L5. Vertebrae: Diffusely decreased marrow signal. Multiple scattered enhancing lesions involving the lumbar vertebral bodies and posterior elements, sacrum, and bilateral iliac bones. The largest lesion is located in the right sacral ala and measures 2.5 cm. No acute fracture or evidence of discitis. Chronic mild L5 inferior endplate compression  deformity. Conus medullaris: Extends to the L2 level and appears normal. No intradural enhancement. Paraspinal and other soft tissues: Negative. Disc levels: T12-L1:  Negative. L1-L2:  Negative. L2-L3: Minimal disc bulging. Mild bilateral facet arthropathy. No stenosis. L3-L4: Small right foraminal disc protrusion. Mild bilateral facet arthropathy. Mild right neuroforaminal stenosis. L4-L5: Minimal disc bulging. Moderate bilateral facet arthropathy. Mild bilateral neuroforaminal stenosis. L5-S1: Negative disc. Mild bilateral facet arthropathy. No stenosis. IMPRESSION: 1. Multifocal tumor involvement of the thoracolumbar spine, pelvis, and ribs. Enhancing tumor nearly completely replacing the T11 vertebral body with moderate pathologic compression fracture, prominent bulging of the posterior cortex, and anterior epidural tumor extension causing moderate to severe spinal canal stenosis. No abnormal cord signal. 2. Multilevel lumbar spondylosis as described above without high-grade stenosis or impingement. Electronically Signed   By: Titus Dubin M.D.   On: 02/01/2021 16:09   DG Chest Portable 1 View  Result Date: 01/13/2021 CLINICAL DATA:  Right-sided chest pain for 1 month,  no known injury, initial encounter EXAM: PORTABLE CHEST 1 VIEW COMPARISON:  04/02/2020 FINDINGS: Cardiac shadow is at the upper limits of normal in size. The lungs are well aerated bilaterally. No focal infiltrate or effusion is seen. Bony structures show no acute abnormality. 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 IMPRESSION: No acute abnormality noted. Electronically Signed   By: Inez Catalina M.D.   On: 01/13/2021 21:01    Labs:  CBC: Recent Labs    11/22/20 1214 01/13/21 2040 01/22/21 1130 02/01/21 0835  WBC 89.0* 50.5* 83.0* 60.1*  HGB 9.8* 10.0* 10.0* 9.6*  HCT 33.4* 33.0* 33.7* 31.4*  PLT 130* 70* 120* 112*    COAGS: No results for input(s): INR, APTT in the last 8760 hours.  BMP: Recent Labs    03/15/20 1122  06/04/20 0855 07/24/20 1114 11/22/20 1214 01/13/21 2040 01/22/21 1130 02/01/21 0835  NA 136 141 138 136 140 139 139  K 4.8 4.3 4.6 4.2 4.6 4.0 4.1  CL 103 107 102 101 107 104 108  CO2 27 25 27 27 23 25 22   GLUCOSE 96 86 94 78 117* 101* 103*  BUN 22* 23* 22* 29* 41* 28* 22*  CALCIUM 9.3 9.1 9.3 9.4 9.5 9.7 8.6*  CREATININE 1.09 1.06 1.13 1.14 1.63* 1.30* 1.17  GFRNONAA >60 >60 >60 >60 49* >60 >60  GFRAA >60 >60 >60  --   --   --   --     LIVER FUNCTION TESTS: Recent Labs    11/22/20 1214 01/13/21 2040 01/22/21 1130 02/01/21 0835  BILITOT 0.6 0.7 0.4 0.7  AST 25 18 22 21   ALT 14 14 17 13   ALKPHOS 53 58 50 53  PROT 8.5* 8.5* 8.8* 8.6*  ALBUMIN 3.5 3.4* 3.2* 3.1*    TUMOR MARKERS: No results for input(s): AFPTM, CEA, CA199, CHROMGRNA in the last 8760 hours.  Assessment and Plan:  60 y.o. male, outpatient. History of mantle cell lymphoma with know T11 compression deformity started Prolia q 6 months since 9.14.21. Presented to the ED at [facility] with lumbar back pain X 6 months. Found to have an enlarging T12 lytic lesion while being worked up for a PE at a prior ED visit. MR thoracic and lumbar from 3.25.22 reads Multifocal tumor involvement of the thoracolumbar spine, pelvis, and ribs. Enhancing tumor nearly completely replacing the T11 vertebral body with moderate pathologic compression fracture, prominent bulging of the posterior cortex, and anterior epidural tumor extension causing moderate to severe spinal canal stenosis. No abnormal cord signal.   All labs and medications are within acceptable parameters. NKDA. Patient has been NPO since midnight.  Risks and benefits of T11 biopsy was discussed with the patient and/or patient's family including, but not limited to bleeding, infection, damage to adjacent structures or low yield requiring additional tests.  All of the questions were answered and there is agreement to proceed.  Consent signed and in chart.   Thank you for  this interesting consult.  I greatly enjoyed meeting MIKALE SILVERSMITH and look forward to participating in their care.  A copy of this report was sent to the requesting provider on this date.  Electronically Signed: Jacqualine Mau, NP 02/12/2021, 7:15 AM   I spent a total of  30 Minutes   in face to face in clinical consultation, greater than 50% of which was counseling/coordinating care for T11 biopsy

## 2021-02-12 NOTE — Discharge Instructions (Addendum)
Needle Biopsy, Care After These instructions tell you how to care for yourself after your procedure. Your doctor may also give you more specific instructions. Call your doctor if you have any problems or questions. What can I expect after the procedure? After the procedure, it is common to have:  Soreness.  Bruising.  Mild pain. Follow these instructions at home:  Return to your normal activities as told by your doctor. Ask your doctor what activities are safe for you.  Take over-the-counter and prescription medicines only as told by your doctor.  Wash your hands with soap and water before you change your bandage (dressing). If you cannot use soap and water, use hand sanitizer.  Follow instructions from your doctor about: ? How to take care of your puncture site. ? When and how to change your bandage. ? When to remove your bandage. In 24 hours  Check your puncture site every day for signs of infection. Watch for: ? Redness, swelling, or pain. ? Fluid or blood. ? Pus or a bad smell. ? Warmth.  Do not take baths, swim, or use a hot tub until your doctor approves. Ask your doctor if you may take showers. You may only be allowed to take sponge baths.  Keep all follow-up visits as told by your doctor. This is important.   Contact a doctor if you have:  A fever.  Redness, swelling, or pain at the puncture site, and it lasts longer than a few days.  Fluid, blood, or pus coming from the puncture site.  Warmth coming from the puncture site. Get help right away if:  You have a lot of bleeding from the puncture site. Summary  After the procedure, it is common to have soreness, bruising, or mild pain at the puncture site.  Check your puncture site every day for signs of infection, such as redness, swelling, or pain.  Get help right away if you have severe bleeding from your puncture site. This information is not intended to replace advice given to you by your health care  provider. Make sure you discuss any questions you have with your health care provider. Document Revised: 04/26/2020 Document Reviewed: 04/26/2020 Elsevier Patient Education  2021 [employer].  Moderate Conscious Sedation, Adult, Care After This sheet gives you information about how to care for yourself after your procedure. Your health care provider may also give you more specific instructions. If you have problems or questions, contact your health care provider. What can I expect after the procedure? After the procedure, it is common to have:  Sleepiness for several hours.  Impaired judgment for several hours.  Difficulty with balance.  Vomiting if you eat too soon. Follow these instructions at home: For the time period you were told by your health care provider:  Rest.  Do not participate in activities where you could fall or become injured.  Do not drive or use machinery.  Do not drink alcohol.  Do not take sleeping pills or medicines that cause drowsiness.  Do not make important decisions or sign legal documents.  Do not take care of children on your own.      Eating and drinking  Follow the diet recommended by your health care provider.  Drink enough fluid to keep your urine pale yellow.  If you vomit: ? Drink water, juice, or soup when you can drink without vomiting. ? Make sure you have little or no nausea before eating solid foods.   General instructions  Take over-the-counter and  prescription medicines only as told by your health care provider.  Have a responsible adult stay with you for the time you are told. It is important to have someone help care for you until you are awake and alert.  Do not smoke.  Keep all follow-up visits as told by your health care provider. This is important. Contact a health care provider if:  You are still sleepy or having trouble with balance after 24 hours.  You feel light-headed.  You keep feeling nauseous or you  keep vomiting.  You develop a rash.  You have a fever.  You have redness or swelling around the IV site. Get help right away if:  You have trouble breathing.  You have new-onset confusion at home. Summary  After the procedure, it is common to feel sleepy, have impaired judgment, or feel nauseous if you eat too soon.  Rest after you get home. Know the things you should not do after the procedure.  Follow the diet recommended by your health care provider and drink enough fluid to keep your urine pale yellow.  Get help right away if you have trouble breathing or new-onset confusion at home. This information is not intended to replace advice given to you by your health care provider. Make sure you discuss any questions you have with your health care provider. Document Revised: 02/24/2020 Document Reviewed: 09/22/2019 Elsevier Patient Education  2021 [employer].

## 2021-02-12 NOTE — Telephone Encounter (Signed)
CRITICAL VALUE STICKER  CRITICAL VALUE:  WBC 269.2  RECEIVER (on-site recipient of call):  A. Ouida Sills, [place] NOTIFIED: 02/12/2021 at [place]  MD NOTIFIED: Delton Coombes  RESPONSE: add differential to lab work to check lymphocyte count as pt has hx of mantle cell lymphoma

## 2021-02-13 ENCOUNTER — Ambulatory Visit: Admission: RE | Admit: 2021-02-13 | Payer: Medicare Other | Source: Ambulatory Visit | Admitting: Radiation Oncology

## 2021-02-13 ENCOUNTER — Ambulatory Visit
Admission: RE | Admit: 2021-02-13 | Discharge: 2021-02-13 | Disposition: A | Discharge status: Home / Self Care | Payer: Medicare Other | Source: Ambulatory Visit | Attending: Radiation Oncology | Admitting: Radiation Oncology

## 2021-02-13 DIAGNOSIS — Z510 Encounter for antineoplastic radiation therapy: Secondary | ICD-10-CM | POA: Insufficient documentation

## 2021-02-13 DIAGNOSIS — C7951 Secondary malignant neoplasm of bone: Secondary | ICD-10-CM | POA: Diagnosis not present

## 2021-02-13 DIAGNOSIS — C8310 Mantle cell lymphoma, unspecified site: Secondary | ICD-10-CM | POA: Insufficient documentation

## 2021-02-13 LAB — PATHOLOGIST SMEAR REVIEW

## 2021-02-13 LAB — CYTOLOGY - NON PAP

## 2021-02-14 ENCOUNTER — Other Ambulatory Visit: Payer: Self-pay

## 2021-02-14 ENCOUNTER — Telehealth (HOSPITAL_COMMUNITY): Payer: Self-pay | Admitting: Hematology

## 2021-02-14 ENCOUNTER — Encounter (HOSPITAL_COMMUNITY)
Admission: RE | Admit: 2021-02-14 | Discharge: 2021-02-14 | Disposition: A | Discharge status: Home / Self Care | Payer: Medicare Other | Source: Ambulatory Visit | Attending: Hematology | Admitting: Hematology

## 2021-02-14 DIAGNOSIS — D509 Iron deficiency anemia, unspecified: Secondary | ICD-10-CM | POA: Insufficient documentation

## 2021-02-14 DIAGNOSIS — C8310 Mantle cell lymphoma, unspecified site: Secondary | ICD-10-CM | POA: Diagnosis not present

## 2021-02-14 DIAGNOSIS — C8590 Non-Hodgkin lymphoma, unspecified, unspecified site: Secondary | ICD-10-CM | POA: Diagnosis not present

## 2021-02-14 LAB — GLUCOSE, CAPILLARY: Glucose-Capillary: 97 mg/dL (ref 70–99)

## 2021-02-14 MED ORDER — FLUDEOXYGLUCOSE F - 18 (FDG) INJECTION: 8.0000 | Freq: Once | INTRAVENOUS | Status: DC | PRN

## 2021-02-14 MED ORDER — FLUDEOXYGLUCOSE F - 18 (FDG) INJECTION
7.8000 | Freq: Once | INTRAVENOUS | Status: AC | PRN
  Administered 2021-02-14: 7.8 via INTRAVENOUS

## 2021-02-14 NOTE — Telephone Encounter (Signed)
Spoke to pts mother in regards to [facility] pt assist fund.  She will have pt bring in financial doc on 4/19 which is next appt date.

## 2021-02-15 DIAGNOSIS — Z510 Encounter for antineoplastic radiation therapy: Secondary | ICD-10-CM | POA: Diagnosis not present

## 2021-02-15 DIAGNOSIS — C8310 Mantle cell lymphoma, unspecified site: Secondary | ICD-10-CM | POA: Diagnosis not present

## 2021-02-15 DIAGNOSIS — C7951 Secondary malignant neoplasm of bone: Secondary | ICD-10-CM | POA: Diagnosis not present

## 2021-02-15 LAB — SURGICAL PATHOLOGY

## 2021-02-18 ENCOUNTER — Ambulatory Visit
Admission: RE | Admit: 2021-02-18 | Discharge: 2021-02-18 | Disposition: A | Discharge status: Home / Self Care | Payer: Medicare Other | Source: Ambulatory Visit | Attending: Radiation Oncology | Admitting: Radiation Oncology

## 2021-02-18 ENCOUNTER — Encounter: Payer: Self-pay | Admitting: General Practice

## 2021-02-18 ENCOUNTER — Other Ambulatory Visit: Payer: Self-pay

## 2021-02-18 ENCOUNTER — Ambulatory Visit: Payer: Medicare Other | Admitting: Radiation Oncology

## 2021-02-18 DIAGNOSIS — C7951 Secondary malignant neoplasm of bone: Secondary | ICD-10-CM | POA: Diagnosis not present

## 2021-02-18 DIAGNOSIS — C8310 Mantle cell lymphoma, unspecified site: Secondary | ICD-10-CM | POA: Diagnosis not present

## 2021-02-18 DIAGNOSIS — Z510 Encounter for antineoplastic radiation therapy: Secondary | ICD-10-CM | POA: Diagnosis not present

## 2021-02-18 NOTE — Progress Notes (Signed)
[place] Spiritual Care Note  Attempted again to reach [facility] by phone, but at an inconvenient time. Per his mother Jewell's suggestion, we plan to try again around 9:00 am tomorrow.   [place], [place], [facility] Pager [phone] Voicemail [phone]

## 2021-02-19 ENCOUNTER — Other Ambulatory Visit (HOSPITAL_COMMUNITY): Payer: Self-pay | Admitting: Surgery

## 2021-02-19 ENCOUNTER — Ambulatory Visit
Admission: RE | Admit: 2021-02-19 | Discharge: 2021-02-19 | Disposition: A | Discharge status: Home / Self Care | Payer: Medicare Other | Source: Ambulatory Visit | Attending: Radiation Oncology | Admitting: Radiation Oncology

## 2021-02-19 ENCOUNTER — Other Ambulatory Visit (HOSPITAL_COMMUNITY): Payer: Self-pay | Admitting: Hematology

## 2021-02-19 ENCOUNTER — Encounter: Payer: Self-pay | Admitting: General Practice

## 2021-02-19 DIAGNOSIS — C8310 Mantle cell lymphoma, unspecified site: Secondary | ICD-10-CM

## 2021-02-19 DIAGNOSIS — C7951 Secondary malignant neoplasm of bone: Secondary | ICD-10-CM | POA: Diagnosis not present

## 2021-02-19 DIAGNOSIS — Z510 Encounter for antineoplastic radiation therapy: Secondary | ICD-10-CM | POA: Diagnosis not present

## 2021-02-19 DIAGNOSIS — D479 Neoplasm of uncertain behavior of lymphoid, hematopoietic and related tissue, unspecified: Secondary | ICD-10-CM

## 2021-02-19 DIAGNOSIS — M8588 Other specified disorders of bone density and structure, other site: Secondary | ICD-10-CM

## 2021-02-19 NOTE — Progress Notes (Signed)
[facility] Spiritual Care Note  Followed up by phone again, reaching Mr Sayegh' mom Martin Majestic, who reports that he is resting in pain after having to maintain an uncomfortable physical position for yesterday's treatment and that he tends not to want to talk, especially on the phone, anyway. Reminded her of ongoing chaplain availability for her as well and will try to meet Mr Lehner when he is on campus for treatment instead.   [place], [place], [facility] Pager [phone] Voicemail [phone]

## 2021-02-20 ENCOUNTER — Ambulatory Visit
Admission: RE | Admit: 2021-02-20 | Discharge: 2021-02-20 | Disposition: A | Discharge status: Home / Self Care | Payer: Medicare Other | Source: Ambulatory Visit | Attending: Radiation Oncology | Admitting: Radiation Oncology

## 2021-02-20 ENCOUNTER — Other Ambulatory Visit: Payer: Self-pay

## 2021-02-20 DIAGNOSIS — C8310 Mantle cell lymphoma, unspecified site: Secondary | ICD-10-CM | POA: Diagnosis not present

## 2021-02-20 DIAGNOSIS — Z510 Encounter for antineoplastic radiation therapy: Secondary | ICD-10-CM | POA: Diagnosis not present

## 2021-02-20 DIAGNOSIS — C7951 Secondary malignant neoplasm of bone: Secondary | ICD-10-CM | POA: Diagnosis not present

## 2021-02-21 ENCOUNTER — Other Ambulatory Visit: Payer: Self-pay

## 2021-02-21 ENCOUNTER — Ambulatory Visit
Admission: RE | Admit: 2021-02-21 | Discharge: 2021-02-21 | Disposition: A | Discharge status: Home / Self Care | Payer: Medicare Other | Source: Ambulatory Visit | Attending: Radiation Oncology | Admitting: Radiation Oncology

## 2021-02-21 ENCOUNTER — Encounter: Payer: Self-pay | Admitting: General Practice

## 2021-02-21 DIAGNOSIS — Z510 Encounter for antineoplastic radiation therapy: Secondary | ICD-10-CM | POA: Diagnosis not present

## 2021-02-21 DIAGNOSIS — C7951 Secondary malignant neoplasm of bone: Secondary | ICD-10-CM | POA: Diagnosis not present

## 2021-02-21 DIAGNOSIS — C8310 Mantle cell lymphoma, unspecified site: Secondary | ICD-10-CM | POA: Diagnosis not present

## 2021-02-21 NOTE — Progress Notes (Signed)
[facility] Note  Met Lance Erickson prior to radiation treatment to introduce Spiritual Care as part of his support team. He reports that his back is feeling much better and that he has strong prayer support from his church family. Provided full packet of [place] (Patient and [facility]) team/programming information, including [facility] clinic and my direct-dial number. Encouraged him to reach out with any needs or questions.   [place], [place], [facility] Pager [phone] Voicemail [phone]

## 2021-02-22 ENCOUNTER — Ambulatory Visit
Admission: RE | Admit: 2021-02-22 | Discharge: 2021-02-22 | Disposition: A | Discharge status: Home / Self Care | Payer: Medicare Other | Source: Ambulatory Visit | Attending: Radiation Oncology | Admitting: Radiation Oncology

## 2021-02-22 DIAGNOSIS — C7951 Secondary malignant neoplasm of bone: Secondary | ICD-10-CM | POA: Diagnosis not present

## 2021-02-22 DIAGNOSIS — Z510 Encounter for antineoplastic radiation therapy: Secondary | ICD-10-CM | POA: Diagnosis not present

## 2021-02-22 DIAGNOSIS — C8310 Mantle cell lymphoma, unspecified site: Secondary | ICD-10-CM | POA: Diagnosis not present

## 2021-02-25 ENCOUNTER — Other Ambulatory Visit: Payer: Self-pay

## 2021-02-25 ENCOUNTER — Other Ambulatory Visit (HOSPITAL_COMMUNITY): Payer: Medicare Other

## 2021-02-25 ENCOUNTER — Ambulatory Visit
Admission: RE | Admit: 2021-02-25 | Discharge: 2021-02-25 | Disposition: A | Discharge status: Home / Self Care | Payer: Medicare Other | Source: Ambulatory Visit | Attending: Radiation Oncology | Admitting: Radiation Oncology

## 2021-02-25 DIAGNOSIS — Z510 Encounter for antineoplastic radiation therapy: Secondary | ICD-10-CM | POA: Diagnosis not present

## 2021-02-25 DIAGNOSIS — C8310 Mantle cell lymphoma, unspecified site: Secondary | ICD-10-CM | POA: Diagnosis not present

## 2021-02-25 DIAGNOSIS — C7951 Secondary malignant neoplasm of bone: Secondary | ICD-10-CM | POA: Diagnosis not present

## 2021-02-26 ENCOUNTER — Ambulatory Visit
Admission: RE | Admit: 2021-02-26 | Discharge: 2021-02-26 | Disposition: A | Discharge status: Home / Self Care | Payer: Medicare Other | Source: Ambulatory Visit | Attending: Radiation Oncology | Admitting: Radiation Oncology

## 2021-02-26 ENCOUNTER — Other Ambulatory Visit (HOSPITAL_COMMUNITY): Payer: Medicare Other | Admitting: General Practice

## 2021-02-26 ENCOUNTER — Ambulatory Visit (HOSPITAL_COMMUNITY): Payer: Medicare Other | Admitting: Hematology

## 2021-02-26 DIAGNOSIS — C8310 Mantle cell lymphoma, unspecified site: Secondary | ICD-10-CM | POA: Diagnosis not present

## 2021-02-26 DIAGNOSIS — Z510 Encounter for antineoplastic radiation therapy: Secondary | ICD-10-CM | POA: Diagnosis not present

## 2021-02-26 DIAGNOSIS — C7951 Secondary malignant neoplasm of bone: Secondary | ICD-10-CM | POA: Diagnosis not present

## 2021-02-27 ENCOUNTER — Ambulatory Visit
Admission: RE | Admit: 2021-02-27 | Discharge: 2021-02-27 | Disposition: A | Discharge status: Home / Self Care | Payer: Medicare Other | Source: Ambulatory Visit | Attending: Radiation Oncology | Admitting: Radiation Oncology

## 2021-02-27 ENCOUNTER — Other Ambulatory Visit: Payer: Self-pay

## 2021-02-27 DIAGNOSIS — C7951 Secondary malignant neoplasm of bone: Secondary | ICD-10-CM | POA: Diagnosis not present

## 2021-02-27 DIAGNOSIS — Z510 Encounter for antineoplastic radiation therapy: Secondary | ICD-10-CM | POA: Diagnosis not present

## 2021-02-27 DIAGNOSIS — C8310 Mantle cell lymphoma, unspecified site: Secondary | ICD-10-CM | POA: Diagnosis not present

## 2021-02-28 ENCOUNTER — Ambulatory Visit
Admission: RE | Admit: 2021-02-28 | Discharge: 2021-02-28 | Disposition: A | Discharge status: Home / Self Care | Payer: Medicare Other | Source: Ambulatory Visit | Attending: Radiation Oncology | Admitting: Radiation Oncology

## 2021-02-28 ENCOUNTER — Inpatient Hospital Stay (HOSPITAL_COMMUNITY): Payer: Medicare Other | Attending: Hematology | Admitting: Hematology

## 2021-02-28 VITALS — BP 124/59 | HR 89 | Temp 98.0°F | Resp 18 | Wt 138.4 lb

## 2021-02-28 DIAGNOSIS — M4800 Spinal stenosis, site unspecified: Secondary | ICD-10-CM | POA: Diagnosis not present

## 2021-02-28 DIAGNOSIS — C8314 Mantle cell lymphoma, lymph nodes of axilla and upper limb: Secondary | ICD-10-CM | POA: Insufficient documentation

## 2021-02-28 DIAGNOSIS — M899 Disorder of bone, unspecified: Secondary | ICD-10-CM | POA: Diagnosis not present

## 2021-02-28 DIAGNOSIS — D509 Iron deficiency anemia, unspecified: Secondary | ICD-10-CM | POA: Diagnosis not present

## 2021-02-28 DIAGNOSIS — C8310 Mantle cell lymphoma, unspecified site: Secondary | ICD-10-CM | POA: Diagnosis not present

## 2021-02-28 DIAGNOSIS — M4854XA Collapsed vertebra, not elsewhere classified, thoracic region, initial encounter for fracture: Secondary | ICD-10-CM | POA: Insufficient documentation

## 2021-02-28 DIAGNOSIS — C7951 Secondary malignant neoplasm of bone: Secondary | ICD-10-CM | POA: Diagnosis not present

## 2021-02-28 DIAGNOSIS — Z510 Encounter for antineoplastic radiation therapy: Secondary | ICD-10-CM | POA: Diagnosis not present

## 2021-02-28 DIAGNOSIS — D649 Anemia, unspecified: Secondary | ICD-10-CM | POA: Diagnosis not present

## 2021-02-28 NOTE — Progress Notes (Signed)
Lance Erickson, Lance Erickson   CLINIC:  Medical Oncology/Hematology  PCP:  [name], MD [address] [place] [address]  [phone]  REASON FOR VISIT:  Follow-up for indolent mantle cell lymphoma and IDA  PRIOR THERAPY: None  CURRENT THERAPY: Daily radiation; iron tablets daily; intermittent Feraheme last on 12/11/2020  INTERVAL HISTORY:  [name], a 60 y.o. male, returns for routine follow-up for his indolent mantle cell lymphoma and IDA. [name] was last seen on 02/04/2021.  Today he is accompanied by his mother and he reports feeling okay. He has one more radiation session left on 04/22. His pain is improving since he started doing radiation. He is drinking a lot of water and reports having frequent urination but denies having dysuria. His appetite is improving and he is eating well but he is still losing weight. He denies having N/V/D.   REVIEW OF SYSTEMS:  Review of Systems  Constitutional: Positive for fatigue (50%) and unexpected weight change. Negative for appetite change.  Respiratory: Positive for shortness of breath (d/t pain).   Gastrointestinal: Negative for diarrhea, nausea and vomiting.  Genitourinary: Negative for difficulty urinating and dysuria.   Neurological: Positive for dizziness (occasional).  All other systems reviewed and are negative.   PAST MEDICAL/SURGICAL HISTORY:  Past Medical History:  Diagnosis Date  . Cancer ([place]) 08/2015   skin, nose  . Leukemia ([place])   . Spinal bifida, closed    Past Surgical History:  Procedure Laterality Date  . FRACTURE SURGERY     left arm   . INGUINAL HERNIA REPAIR Right   . IR FLUORO GUIDED NEEDLE PLC ASPIRATION/INJECTION LOC  02/12/2021    SOCIAL HISTORY:  Social History   Socioeconomic History  . Marital status: Single    Spouse name: Not on file  . Number of children: Not on file  . Years of education: Not on file  . Highest education level:  Not on file  Occupational History  . Occupation: disabled  Tobacco Use  . Smoking status: Never Smoker  . Smokeless tobacco: Never Used  Vaping Use  . Vaping Use: Never used  Substance and Sexual Activity  . Alcohol use: No  . Drug use: No  . Sexual activity: Not Currently  Other Topics Concern  . Not on file  Social History Narrative  . Not on file   Social Determinants of Health   Financial Resource Strain: Medium Risk  . Difficulty of Paying Living Expenses: Somewhat hard  Food Insecurity: No Food Insecurity  . Worried About Charity fundraiser in the Last Year: Never true  . Ran Out of Food in the Last Year: Never true  Transportation Needs: No Transportation Needs  . Lack of Transportation (Medical): No  . Lack of Transportation (Non-Medical): No  Physical Activity: Not on file  Stress: Not on file  Social Connections: Not on file  Intimate Partner Violence: Not on file    FAMILY HISTORY:  Family History  Problem Relation Age of Onset  . Hypertension Mother   . Deep vein thrombosis Mother   . Hyperlipidemia Mother   . Cancer Mother        skin  . Heart attack Father   . Diabetes Brother   . Hypertension Brother   . Hyperlipidemia Brother   . Heart attack Brother   . Arthritis Brother        back pain  . Arthritis Brother  back pain  . Cancer Maternal Grandmother        ovarian and uterus cancer  . Heart attack Maternal Grandmother   . Alcohol abuse Maternal Grandfather   . Tuberculosis Maternal Grandfather   . Heart disease Paternal Grandmother   . Pneumonia Paternal Grandfather     CURRENT MEDICATIONS:  Current Outpatient Medications  Medication Sig Dispense Refill  . acetaminophen (TYLENOL) 500 MG tablet Take 1,000 mg by mouth every 6 (six) hours as needed for mild pain or moderate pain.    Marland Kitchen dexamethasone (DECADRON) 2 MG tablet Take 1 tablet (2 mg total) by mouth 2 (two) times daily with a meal. 30 tablet 0  . traMADol (ULTRAM) 50 MG  tablet Take 50 mg by mouth every 6 (six) hours as needed.     No current facility-administered medications for this visit.    ALLERGIES:  No Known Allergies  PHYSICAL EXAM:  Performance status (ECOG): 1 - Symptomatic but completely ambulatory  Vitals:   02/28/21 1202 02/28/21 1215  BP: (!) 115/45 (!) 124/59  Pulse:  89  Resp:  18  Temp:  98 F (36.7 C)  SpO2:  99%   Wt Readings from Last 3 Encounters:  02/28/21 138 lb 6 oz (62.8 kg)  02/12/21 150 lb (68 kg)  02/11/21 144 lb (65.3 kg)   Physical Exam Vitals reviewed.  Constitutional:      Appearance: Normal appearance.  Cardiovascular:     Rate and Rhythm: Normal rate and regular rhythm.     Pulses: Normal pulses.     Heart sounds: Normal heart sounds.  Pulmonary:     Effort: Pulmonary effort is normal.     Breath sounds: Normal breath sounds.  Musculoskeletal:     Thoracic back: Bony tenderness (lower thoracic mid-line TTP) present.  Neurological:     General: No focal deficit present.     Mental Status: He is alert and oriented to person, place, and time.  Psychiatric:        Mood and Affect: Mood normal.        Behavior: Behavior normal.     LABORATORY DATA:  I have reviewed the labs as listed.  CBC Latest Ref Rng & Units 02/12/2021 02/12/2021 02/01/2021  WBC 4.0 - 10.5 K/uL 271.6(HH) 269.2(HH) 60.1(HH)  Hemoglobin 13.0 - 17.0 g/dL 11.5(L) 11.9(L) 9.6(L)  Hematocrit 39.0 - 52.0 % 41.7 41.6 31.4(L)  Platelets 150 - 400 K/uL 188 171 112(L)   CMP Latest Ref Rng & Units 02/01/2021 01/22/2021 01/13/2021  Glucose 70 - 99 mg/dL 103(H) 101(H) 117(H)  BUN 6 - 20 mg/dL 22(H) 28(H) 41(H)  Creatinine 0.61 - 1.24 mg/dL 1.17 1.30(H) 1.63(H)  Sodium 135 - 145 mmol/L 139 139 140  Potassium 3.5 - 5.1 mmol/L 4.1 4.0 4.6  Chloride 98 - 111 mmol/L 108 104 107  CO2 22 - 32 mmol/L 22 25 23   Calcium 8.9 - 10.3 mg/dL 8.6(L) 9.7 9.5  Total Protein 6.5 - 8.1 g/dL 8.6(H) 8.8(H) 8.5(H)  Total Bilirubin 0.3 - 1.2 mg/dL 0.7 0.4 0.7   Alkaline Phos 38 - 126 U/L 53 50 58  AST 15 - 41 U/L 21 22 18   ALT 0 - 44 U/L 13 17 14       Component Value Date/Time   RBC 4.20 (L) 02/12/2021 0655   RBC 4.25 02/12/2021 0655   MCV 99.0 02/12/2021 0655   MCV 98.1 02/12/2021 0655   MCV 85 10/08/2017 0950   MCH 28.3 02/12/2021 0655   MCH  27.1 02/12/2021 0655   MCHC 28.6 (L) 02/12/2021 0655   MCHC 27.6 (L) 02/12/2021 0655   RDW 20.6 (H) 02/12/2021 0655   RDW 20.4 (H) 02/12/2021 0655   RDW 14.7 10/08/2017 0950   LYMPHSABS 244.4 (H) 02/12/2021 0655   LYMPHSABS 11.2 (H) 10/08/2017 0950   MONOABS 5.4 (H) 02/12/2021 0655   EOSABS 0.0 02/12/2021 0655   EOSABS 0.1 10/08/2017 0950   BASOSABS 0.0 02/12/2021 0655   BASOSABS 0.1 10/08/2017 0950   Lab Results  Component Value Date   TOTALPROTELP 9.5 (H) 02/04/2021   TOTALPROTELP 9.3 (H) 02/04/2021   ALBUMINELP 3.9 02/04/2021   A1GS 0.4 02/04/2021   A2GS 0.8 02/04/2021   BETS 4.0 (H) 02/04/2021   GAMS 0.1 (L) 02/04/2021   MSPIKE 3.3 (H) 02/04/2021   SPEI Comment 02/04/2021    Lab Results  Component Value Date   KPAFRELGTCHN 938.7 (H) 02/04/2021   LAMBDASER 2.2 (L) 02/04/2021   KAPLAMBRATIO 426.68 (H) 02/04/2021   Lab Results  Component Value Date   LDH 146 02/04/2021   LDH 103 11/22/2020   LDH 102 06/04/2020    DIAGNOSTIC IMAGING:  I have independently reviewed the scans and discussed with the patient. CT Lumbar Spine Wo Contrast  Result Date: 02/01/2021 CLINICAL DATA:  Patient with a history of lymphoma. Low back pain for over 6 months which acutely worsened yesterday. EXAM: CT LUMBAR SPINE WITHOUT CONTRAST TECHNIQUE: Multidetector CT imaging of the lumbar spine was performed without intravenous contrast administration. Multiplanar CT image reconstructions were also generated. COMPARISON:  CT chest 01/13/2021.  PET CT scan 04/02/2020. FINDINGS: Segmentation: Standard. Alignment: 0.6 cm anterolisthesis L4 on L5 is due to facet arthropathy. Vertebrae: Bones are markedly  heterogeneous throughout. There is partial visualization of a lytic lesion in the right pedicle and facet of T12. A second lytic lesion is seen in the posterior margin L1 in the right pedicle and facet of L1. A large lytic lesion is also identified in the right sacrum. Small lytic lesions are seen in the posterior aspect of the iliac wings bilaterally. No fracture is identified. Paraspinal and other soft tissues: Punctate nonobstructing stones are noted in the lower pole of the right kidney. Otherwise negative. Disc levels: Scattered facet degenerative disease appears worst at L4-5. There is also a shallow disc bulge at L4-5 but the central canal and foramina appear open. Minimal disc bulge L5-S1 without stenosis also noted. IMPRESSION: Lytic lesions best seen in the right pedicles and facets at T12 and L1, right sacrum and posterior ilium highly worrisome for recurrent tumor. The patient has a large destructive lesion in the T11 vertebral body on CT angiogram of the chest 01/13/2021. This level is not included on the study. If there is concern for epidural tumor and myelopathy, MRI of the thoracic spine with and without contrast could be used for further evaluation. No central canal stenosis is seen on this study. Negative for fracture. Electronically Signed   By: Inge Rise M.D.   On: 02/01/2021 09:49   MR THORACIC SPINE W WO CONTRAST  Result Date: 02/01/2021 CLINICAL DATA:  Back pain.  History of lymphoma. EXAM: MRI THORACIC AND LUMBAR SPINE WITHOUT AND WITH CONTRAST TECHNIQUE: Multiplanar and multiecho pulse sequences of the thoracic and lumbar spine were obtained without and with intravenous contrast. CONTRAST:  15mL GADAVIST GADOBUTROL 1 MMOL/ML IV SOLN COMPARISON:  CT lumbar spine from same day. CTA chest dated January 13, 2021. FINDINGS: MRI THORACIC SPINE FINDINGS Alignment:  Physiologic. Vertebrae: Diffusely decreased  marrow signal. Enhancing tumor nearly completely replacing the T11 vertebral body  with extension into the posterior elements. Moderate height loss and prominent bulging of the posterior cortex and anterior epidural tumor extension with moderate to severe spinal canal stenosis (series 22, image 27). Numerous additional small enhancing lesions involving the thoracic vertebral bodies and posterior elements as well as the bilateral ribs. Cord:  Normal signal and morphology.  No intradural enhancement. Paraspinal and other soft tissues: Trace left pleural effusion. Bilateral lower lobe atelectasis. Disc levels: No significant disc bulge or herniation. No additional spinal canal stenosis. No neuroforaminal stenosis. MRI LUMBAR SPINE FINDINGS Segmentation:  Standard. Alignment:  Unchanged 5 mm anterolisthesis at L4-L5. Vertebrae: Diffusely decreased marrow signal. Multiple scattered enhancing lesions involving the lumbar vertebral bodies and posterior elements, sacrum, and bilateral iliac bones. The largest lesion is located in the right sacral ala and measures 2.5 cm. No acute fracture or evidence of discitis. Chronic mild L5 inferior endplate compression deformity. Conus medullaris: Extends to the L2 level and appears normal. No intradural enhancement. Paraspinal and other soft tissues: Negative. Disc levels: T12-L1:  Negative. L1-L2:  Negative. L2-L3: Minimal disc bulging. Mild bilateral facet arthropathy. No stenosis. L3-L4: Small right foraminal disc protrusion. Mild bilateral facet arthropathy. Mild right neuroforaminal stenosis. L4-L5: Minimal disc bulging. Moderate bilateral facet arthropathy. Mild bilateral neuroforaminal stenosis. L5-S1: Negative disc. Mild bilateral facet arthropathy. No stenosis. IMPRESSION: 1. Multifocal tumor involvement of the thoracolumbar spine, pelvis, and ribs. Enhancing tumor nearly completely replacing the T11 vertebral body with moderate pathologic compression fracture, prominent bulging of the posterior cortex, and anterior epidural tumor extension causing  moderate to severe spinal canal stenosis. No abnormal cord signal. 2. Multilevel lumbar spondylosis as described above without high-grade stenosis or impingement. Electronically Signed   By: Titus Dubin M.D.   On: 02/01/2021 16:09   MR Lumbar Spine W Wo Contrast  Result Date: 02/01/2021 CLINICAL DATA:  Back pain.  History of lymphoma. EXAM: MRI THORACIC AND LUMBAR SPINE WITHOUT AND WITH CONTRAST TECHNIQUE: Multiplanar and multiecho pulse sequences of the thoracic and lumbar spine were obtained without and with intravenous contrast. CONTRAST:  69mL GADAVIST GADOBUTROL 1 MMOL/ML IV SOLN COMPARISON:  CT lumbar spine from same day. CTA chest dated January 13, 2021. FINDINGS: MRI THORACIC SPINE FINDINGS Alignment:  Physiologic. Vertebrae: Diffusely decreased marrow signal. Enhancing tumor nearly completely replacing the T11 vertebral body with extension into the posterior elements. Moderate height loss and prominent bulging of the posterior cortex and anterior epidural tumor extension with moderate to severe spinal canal stenosis (series 22, image 27). Numerous additional small enhancing lesions involving the thoracic vertebral bodies and posterior elements as well as the bilateral ribs. Cord:  Normal signal and morphology.  No intradural enhancement. Paraspinal and other soft tissues: Trace left pleural effusion. Bilateral lower lobe atelectasis. Disc levels: No significant disc bulge or herniation. No additional spinal canal stenosis. No neuroforaminal stenosis. MRI LUMBAR SPINE FINDINGS Segmentation:  Standard. Alignment:  Unchanged 5 mm anterolisthesis at L4-L5. Vertebrae: Diffusely decreased marrow signal. Multiple scattered enhancing lesions involving the lumbar vertebral bodies and posterior elements, sacrum, and bilateral iliac bones. The largest lesion is located in the right sacral ala and measures 2.5 cm. No acute fracture or evidence of discitis. Chronic mild L5 inferior endplate compression deformity.  Conus medullaris: Extends to the L2 level and appears normal. No intradural enhancement. Paraspinal and other soft tissues: Negative. Disc levels: T12-L1:  Negative. L1-L2:  Negative. L2-L3: Minimal disc bulging. Mild bilateral facet arthropathy.  No stenosis. L3-L4: Small right foraminal disc protrusion. Mild bilateral facet arthropathy. Mild right neuroforaminal stenosis. L4-L5: Minimal disc bulging. Moderate bilateral facet arthropathy. Mild bilateral neuroforaminal stenosis. L5-S1: Negative disc. Mild bilateral facet arthropathy. No stenosis. IMPRESSION: 1. Multifocal tumor involvement of the thoracolumbar spine, pelvis, and ribs. Enhancing tumor nearly completely replacing the T11 vertebral body with moderate pathologic compression fracture, prominent bulging of the posterior cortex, and anterior epidural tumor extension causing moderate to severe spinal canal stenosis. No abnormal cord signal. 2. Multilevel lumbar spondylosis as described above without high-grade stenosis or impingement. Electronically Signed   By: Titus Dubin M.D.   On: 02/01/2021 16:09   NM PET Image Restag (PS) Skull Base To Thigh  Result Date: 02/15/2021 CLINICAL DATA:  Subsequent treatment strategy for lymphoma. EXAM: NUCLEAR MEDICINE PET SKULL BASE TO THIGH TECHNIQUE: 8.2 mCi F-18 FDG was injected intravenously. Full-ring PET imaging was performed from the skull base to thigh after the radiotracer. CT data was obtained and used for attenuation correction and anatomic localization. Fasting blood glucose: 97 mg/dl COMPARISON:  Spine MRI 02/01/2021, PET-CT scan 04/02/2020 FINDINGS: Mediastinal blood pool activity: SUV max 2.0 Liver activity: SUV max 2.6 NECK: No hypermetabolic lymph nodes in the neck. Incidental CT findings: none CHEST: No hypermetabolic mediastinal or hilar nodes. No suspicious pulmonary nodules on the CT scan. Normal morphology axillary lymph nodes have no significant metabolic activity (SUV max equal 1.5)  Incidental CT findings: Mild basilar atelectasis. ABDOMEN/PELVIS: No abnormal hypermetabolic activity within the liver, pancreas, adrenal glands, or spleen. No hypermetabolic lymph nodes in the abdomen or pelvis. Spleen is mildly enlarged with metabolic activity similar to the liver. Spleen measures 14 cm in craniocaudad dimension unchanged from prior. No new adenopathy in the abdomen pelvis. Small focus of activity through the gastric antrum is favored benign. Incidental CT findings: none SKELETON: Progressive replacement and collapse of the T11 vertebral body described on recent MRI. This lesion was present on comparison PET-CT scan 1 year prior. There is only mild activity through this region with SUV max equal 4.3. No new lesions within the spine are identified. There is extensive trabeculation in sclerotic lesions throughout the entirety of the spine pelvis ribs and shoulder girdles. Incidental CT findings: none IMPRESSION: 1. Progressive replacement of the T11 vertebral body as described on comparison MRI. Finding progressed from PET-CT scan 1 year prior. Intermediate metabolic activity through this regions similar to comparison PET-CT scan ( [place] 4). 2. No evidence of new or progressive lymphoma. 3. No lymphadenopathy. Axillary nodes have normal morphology and no significant metabolic activity. 4. Stable splenomegaly with normal marrow activity. 5. Extensive trabeculation in sclerotic lesions throughout the entire spine as described Electronically Signed   By: Suzy Bouchard M.D.   On: 02/15/2021 17:03   IR Fluoro Guide Ndl Plmt / BX  Result Date: 02/12/2021 INDICATION: 60 year old male with past medical history is significant for mantle cell lymphoma presenting with pathologic fracture of the T11 vertebral body. He comes to our service today for a fluoroscopy guided bone biopsy. EXAM: FLUOROSCOPY GUIDED T11 FINE-NEEDLE ASPIRATION AND CORE BONE BIOPSY MEDICATIONS: Ancef 2 gm IV ANESTHESIA/SEDATION:  Moderate (conscious) sedation was employed during this procedure. A total of Versed 1 mg and Fentanyl 25 mcg was administered intravenously. Moderate Sedation Time: 27 minutes. The patient's level of consciousness and vital signs were monitored continuously by radiology nursing throughout the procedure under my direct supervision. FLUOROSCOPY TIME:  Fluoroscopy Time: 5 minutes 42 seconds (513 mGy). COMPLICATIONS: None immediate. PROCEDURE: Informed  written consent was obtained from the patient after a thorough discussion of the procedural risks, benefits and alternatives. All questions were addressed. Maximal Sterile Barrier Technique was utilized including caps, mask, sterile gowns, sterile gloves, sterile drape, hand hygiene and skin antiseptic. A timeout was performed prior to the initiation of the procedure. The patient was placed in prone position on the angiography table. The thoracic spine region was prepped and draped in a sterile fashion. Under fluoroscopy, the T11 vertebral body was delineated and the skin area was marked. The skin was infiltrated with a 1% Lidocaine approximately 2.6 cm lateral to the spinous process projection on the right. Using a 22-gauge spinal needle, the soft issue and the peripedicular space and periosteum were infiltrated with Bupivacaine 0.5%. A skin incision was made at the access site. Subsequently, an 11-gauge Kyphon trocar was inserted under fluoroscopic guidance until contact with the pedicle was obtained. The trocar was inserted under light hammer tapping into the pedicle until the posterior boundaries of the vertebral body was reached. The diamond mandrill was removed. Fine-needle aspiration of the vertebral body was performed with a 20 gauge x 20 cm needle. Then, one core biopsy wasobtained noting very soft consistency while advancing the needle. Sample obtained appeared to represent mostly blood. Redirection of the biopsy needle showed similar results. The sample was  placed in formalin and sent to lab for tissue exam. The cannula was then removed. The access site was cleaned and covered with a sterile bandage. IMPRESSION: Fluoroscopy-guided right transpedicular approach for T11 vertebral body fine-needle aspiration and core bone biopsy scratch. Bone samples obtained were sent for pathology analysis. Electronically Signed   By: Pedro Earls M.D.   On: 02/12/2021 12:52     ASSESSMENT:  1. Indolent mantle cell lymphoma: -BMBX on 12/03/2017 showed hypercellular marrow with involvement by NHL. Bone marrow is predominantly involved by small lymphoid cells expressing pan B cell antigens including CD20, CD5 and cyclin D1 expression. Chromosome analysis shows 32, XY. CLL FISH panel was negative. Minor population of monoclonal, kappa restricted plasma cells are present 2% of cells. -Labs on 03/15/2020 shows M spike of 1.5 g. White count is 39.7. Platelet count is 116. Hemoglobin is 10.6. -PET scan on 04/02/2020 shows left axillary lymphadenopathy, SUV max 2.6. No mediastinal or hilar adenopathy. Right inguinal adenopathy with SUV 1.6. No splenic hypermetabolism. Splenomegaly measuring 15.2 cm. New mild compression deformity at T11 with hypermetabolism, SUV 4.1. There is also new hypermetabolic within the spinous process of T2, SUV 3.8. - He was followed closely without any active treatment. - PET scan on 02/14/2021 shows progressive replacement of the T11 vertebral body, SUV 4.3.  No new lesions in the spine.  Splenomegaly with metabolic activity similar to the liver, measuring 14 cm.  No new adenopathy.  2. Osteopenia: -Bone density on 06/04/2020 showed T score of -2.2.   PLAN:  1. Indolent mantle cell lymphoma: -Reviewed biopsy of the T11 vertebral body on 02/12/2021.  It showed increased small lymphocytes positive for CD20, 79 and cyclin D1.  They are negative for CD5 and CD23.  There are few fragments consisting of sheets of atypical plasma  cells, by IHC positive for CD138.  They are kappa restricted.  Findings consistent with the natural with CD5 negative and cyclin D1 positive.  Differential diagnosis includes mantle cell lymphoma, atypical B-cell lymphoma with plasmacytic differentiation (marginal zone lymphoma, lymphoplasmacytic lymphoma). - Reviewed PET scan from 02/14/2021 which showed progressive replacement of T11 vertebral body.  No  evidence of new or progressive lymphoma.  No lymphadenopathy.  Axillary lymph nodes show normal morphology.  Stable splenomegaly with normal marrow activity.  Spleen metabolic activity similar to liver. - I have recommended seeking opinion from [place] expert at [facility]  I have called and talked to Dr. Cassell Clement who has kindly agreed to see this patient. - He might need bone marrow aspiration and biopsy repeated again. - RTC 4 weeks for follow-up.  2. Bone lesions: -MRI of the thoracic and lumbar spine on 02/01/2021 showed multifocal tumor involvement of the thoracolumbar spine and pelvis and ribs.  Enhancing tumor near completely replacing the T11 vertebral body with moderate pathological compression fracture.  Prominent bulging of the posterior cortex and anterior epidural tumor extension causing moderate to severe spinal canal stenosis.  No abnormal cord signal. - He completed radiation therapy to the spine.  3. Normocytic anemia: -He received intermittent Feraheme infusions, last 1 on 12/11/2020. - CBC on 02/12/2021 shows hemoglobin 11.5.  Orders placed this encounter:  No orders of the defined types were placed in this encounter.    Derek Jack, MD [place] [phone]   I, Milinda Antis, am acting as a scribe for Dr. Sanda Linger.  I, Derek Jack MD, have reviewed the above documentation for accuracy and completeness, and I agree with the above.

## 2021-02-28 NOTE — Patient Instructions (Signed)
[place] at [facility] Discharge Instructions  You were seen today by Dr. Delton Coombes. He went over your recent results and scans. Finish your radiation treatment. You will be referred to a lymphoma specialist in [place] multiple small meals throughout the day to improve your weight and energy levels; drink 1-2 cans of Ensure daily if you are still not eating. Dr. Delton Coombes will see you back in 1 month for follow up.   Thank you for choosing [place] at [facility] to provide your oncology and hematology care.  To afford each patient quality time with our provider, please arrive at least 15 minutes before your scheduled appointment time.   If you have a lab appointment with the [facility] please come in thru the Main Entrance and check in at the main information desk  You need to re-schedule your appointment should you arrive 10 or more minutes late.  We strive to give you quality time with our providers, and arriving late affects you and other patients whose appointments are after yours.  Also, if you no show three or more times for appointments you may be dismissed from the clinic at the providers discretion.     Again, thank you for choosing [facility].  Our hope is that these requests will decrease the amount of time that you wait before being seen by our physicians.       _____________________________________________________________  Should you have questions after your visit to [facility], please contact our office at [phone] between the hours of 8:00 a.m. and 4:30 p.m.  Voicemails left after 4:00 p.m. will not be returned until the following business day.  For prescription refill requests, have your pharmacy contact our office and allow 72 hours.    Cancer Center Support Programs:   > Cancer Support Group  2nd Tuesday of the month 1pm-2pm, Journey Room

## 2021-03-01 ENCOUNTER — Ambulatory Visit
Admission: RE | Admit: 2021-03-01 | Discharge: 2021-03-01 | Disposition: A | Discharge status: Home / Self Care | Payer: Medicare Other | Source: Ambulatory Visit | Attending: Radiation Oncology | Admitting: Radiation Oncology

## 2021-03-01 ENCOUNTER — Encounter: Payer: Self-pay | Admitting: Radiation Oncology

## 2021-03-01 ENCOUNTER — Other Ambulatory Visit: Payer: Self-pay

## 2021-03-01 DIAGNOSIS — C7951 Secondary malignant neoplasm of bone: Secondary | ICD-10-CM | POA: Diagnosis not present

## 2021-03-01 DIAGNOSIS — C8310 Mantle cell lymphoma, unspecified site: Secondary | ICD-10-CM | POA: Diagnosis not present

## 2021-03-01 DIAGNOSIS — Z510 Encounter for antineoplastic radiation therapy: Secondary | ICD-10-CM | POA: Diagnosis not present

## 2021-03-04 ENCOUNTER — Ambulatory Visit (HOSPITAL_COMMUNITY): Payer: Medicare Other | Admitting: Hematology

## 2021-03-05 ENCOUNTER — Ambulatory Visit (HOSPITAL_COMMUNITY): Payer: Medicare Other | Admitting: General Practice

## 2021-03-05 NOTE — Progress Notes (Signed)
[facility] CSW Progress Notes  Attempted to contact patient for phone visit, unable to reach at number in chart.  Will reschedule for next week.  Edwyna Shell, LCSW Clinical Social Worker Phone:  [phone]

## 2021-03-06 DIAGNOSIS — C8319 Mantle cell lymphoma, extranodal and solid organ sites: Secondary | ICD-10-CM | POA: Diagnosis not present

## 2021-03-07 ENCOUNTER — Other Ambulatory Visit (HOSPITAL_COMMUNITY): Payer: Self-pay

## 2021-03-07 DIAGNOSIS — R110 Nausea: Secondary | ICD-10-CM

## 2021-03-07 DIAGNOSIS — C7951 Secondary malignant neoplasm of bone: Secondary | ICD-10-CM

## 2021-03-11 DIAGNOSIS — E790 Hyperuricemia without signs of inflammatory arthritis and tophaceous disease: Secondary | ICD-10-CM | POA: Diagnosis not present

## 2021-03-11 DIAGNOSIS — C8310 Mantle cell lymphoma, unspecified site: Secondary | ICD-10-CM | POA: Diagnosis not present

## 2021-03-11 DIAGNOSIS — M4850XA Collapsed vertebra, not elsewhere classified, site unspecified, initial encounter for fracture: Secondary | ICD-10-CM | POA: Diagnosis not present

## 2021-03-11 DIAGNOSIS — M4804 Spinal stenosis, thoracic region: Secondary | ICD-10-CM | POA: Diagnosis not present

## 2021-03-11 DIAGNOSIS — Z923 Personal history of irradiation: Secondary | ICD-10-CM | POA: Diagnosis not present

## 2021-03-11 DIAGNOSIS — C7951 Secondary malignant neoplasm of bone: Secondary | ICD-10-CM | POA: Diagnosis not present

## 2021-03-11 DIAGNOSIS — C8330 Diffuse large B-cell lymphoma, unspecified site: Secondary | ICD-10-CM | POA: Diagnosis not present

## 2021-03-11 DIAGNOSIS — R0902 Hypoxemia: Secondary | ICD-10-CM | POA: Diagnosis not present

## 2021-03-11 DIAGNOSIS — C8319 Mantle cell lymphoma, extranodal and solid organ sites: Secondary | ICD-10-CM | POA: Diagnosis not present

## 2021-03-11 DIAGNOSIS — R161 Splenomegaly, not elsewhere classified: Secondary | ICD-10-CM | POA: Diagnosis not present

## 2021-03-11 DIAGNOSIS — C8510 Unspecified B-cell lymphoma, unspecified site: Secondary | ICD-10-CM | POA: Diagnosis not present

## 2021-03-11 DIAGNOSIS — K5900 Constipation, unspecified: Secondary | ICD-10-CM | POA: Diagnosis not present

## 2021-03-11 DIAGNOSIS — L299 Pruritus, unspecified: Secondary | ICD-10-CM | POA: Diagnosis not present

## 2021-03-11 DIAGNOSIS — M4854XA Collapsed vertebra, not elsewhere classified, thoracic region, initial encounter for fracture: Secondary | ICD-10-CM | POA: Diagnosis not present

## 2021-03-11 DIAGNOSIS — E8770 Fluid overload, unspecified: Secondary | ICD-10-CM | POA: Diagnosis not present

## 2021-03-11 DIAGNOSIS — M545 Low back pain, unspecified: Secondary | ICD-10-CM | POA: Diagnosis not present

## 2021-03-11 DIAGNOSIS — M8448XA Pathological fracture, other site, initial encounter for fracture: Secondary | ICD-10-CM | POA: Diagnosis not present

## 2021-03-11 DIAGNOSIS — R627 Adult failure to thrive: Secondary | ICD-10-CM | POA: Diagnosis not present

## 2021-03-11 DIAGNOSIS — S22081A Stable burst fracture of T11-T12 vertebra, initial encounter for closed fracture: Secondary | ICD-10-CM | POA: Diagnosis not present

## 2021-03-11 DIAGNOSIS — R5081 Fever presenting with conditions classified elsewhere: Secondary | ICD-10-CM | POA: Diagnosis not present

## 2021-03-11 DIAGNOSIS — C8580 Other specified types of non-Hodgkin lymphoma, unspecified site: Secondary | ICD-10-CM | POA: Diagnosis not present

## 2021-03-11 DIAGNOSIS — Z79899 Other long term (current) drug therapy: Secondary | ICD-10-CM | POA: Diagnosis not present

## 2021-03-11 DIAGNOSIS — R918 Other nonspecific abnormal finding of lung field: Secondary | ICD-10-CM | POA: Diagnosis not present

## 2021-03-11 DIAGNOSIS — Q059 Spina bifida, unspecified: Secondary | ICD-10-CM | POA: Diagnosis not present

## 2021-03-11 DIAGNOSIS — C9000 Multiple myeloma not having achieved remission: Secondary | ICD-10-CM | POA: Diagnosis not present

## 2021-03-12 ENCOUNTER — Ambulatory Visit (HOSPITAL_COMMUNITY): Payer: Medicare Other | Admitting: General Practice

## 2021-03-12 ENCOUNTER — Encounter (HOSPITAL_COMMUNITY): Payer: Self-pay

## 2021-03-12 NOTE — Progress Notes (Signed)
[facility] CSW Progress Notes  Second call to patient to assess for needs/resources.  Unable to reach. Left generic VM w my contact information as VM was not personally identified, encouraged him to return my call if desired.  Edwyna Shell, LCSW Clinical Social Worker Phone:  [phone]

## 2021-03-21 DIAGNOSIS — S22081D Stable burst fracture of T11-T12 vertebra, subsequent encounter for fracture with routine healing: Secondary | ICD-10-CM | POA: Diagnosis not present

## 2021-03-21 DIAGNOSIS — M8448XA Pathological fracture, other site, initial encounter for fracture: Secondary | ICD-10-CM | POA: Diagnosis not present

## 2021-03-21 DIAGNOSIS — M47814 Spondylosis without myelopathy or radiculopathy, thoracic region: Secondary | ICD-10-CM | POA: Diagnosis not present

## 2021-03-21 DIAGNOSIS — M8448XD Pathological fracture, other site, subsequent encounter for fracture with routine healing: Secondary | ICD-10-CM | POA: Diagnosis not present

## 2021-03-26 DIAGNOSIS — Z8249 Family history of ischemic heart disease and other diseases of the circulatory system: Secondary | ICD-10-CM | POA: Diagnosis not present

## 2021-03-26 DIAGNOSIS — Q059 Spina bifida, unspecified: Secondary | ICD-10-CM | POA: Diagnosis not present

## 2021-03-26 DIAGNOSIS — C9000 Multiple myeloma not having achieved remission: Secondary | ICD-10-CM | POA: Diagnosis not present

## 2021-03-26 DIAGNOSIS — Z923 Personal history of irradiation: Secondary | ICD-10-CM | POA: Diagnosis not present

## 2021-03-26 DIAGNOSIS — C8580 Other specified types of non-Hodgkin lymphoma, unspecified site: Secondary | ICD-10-CM | POA: Diagnosis not present

## 2021-03-26 DIAGNOSIS — Z85828 Personal history of other malignant neoplasm of skin: Secondary | ICD-10-CM | POA: Diagnosis not present

## 2021-03-26 DIAGNOSIS — C8318 Mantle cell lymphoma, lymph nodes of multiple sites: Secondary | ICD-10-CM | POA: Diagnosis not present

## 2021-03-26 DIAGNOSIS — Z8349 Family history of other endocrine, nutritional and metabolic diseases: Secondary | ICD-10-CM | POA: Diagnosis not present

## 2021-03-26 DIAGNOSIS — Z833 Family history of diabetes mellitus: Secondary | ICD-10-CM | POA: Diagnosis not present

## 2021-03-26 DIAGNOSIS — Z8261 Family history of arthritis: Secondary | ICD-10-CM | POA: Diagnosis not present

## 2021-03-26 DIAGNOSIS — C8519 Unspecified B-cell lymphoma, extranodal and solid organ sites: Secondary | ICD-10-CM | POA: Diagnosis not present

## 2021-03-26 DIAGNOSIS — Z79899 Other long term (current) drug therapy: Secondary | ICD-10-CM | POA: Diagnosis not present

## 2021-03-27 NOTE — Progress Notes (Shared)
[place], [place] [id number]   CLINIC:  Medical Oncology/Hematology  PCP:  No primary care provider on file. No primary physician on file. None   REASON FOR VISIT:  Follow-up for indolent mantle cell lymphoma and IDA  PRIOR THERAPY: none  NGS Results: not done  CURRENT THERAPY:  Daily radiation; iron tablets daily; intermittent Feraheme last on 12/11/2020  BRIEF ONCOLOGIC HISTORY:  Oncology History  Anemia, iron deficiency    CANCER STAGING: Cancer Staging No matching staging information was found for the patient.  INTERVAL HISTORY:  Mr. URAL ACREE, a 60 y.o. male, returns for routine follow-up of his indolent mantle cell lymphoma and IDA. Raidyn was last seen on 02/28/2021.    REVIEW OF SYSTEMS:  Review of Systems  All other systems reviewed and are negative.   PAST MEDICAL/SURGICAL HISTORY:  Past Medical History:  Diagnosis Date  . Cancer ([place]) 08/2015   skin, nose  . Leukemia ([place])   . Spinal bifida, closed    Past Surgical History:  Procedure Laterality Date  . FRACTURE SURGERY     left arm   . INGUINAL HERNIA REPAIR Right   . IR FLUORO GUIDED NEEDLE PLC ASPIRATION/INJECTION LOC  02/12/2021    SOCIAL HISTORY:  Social History   Socioeconomic History  . Marital status: Single    Spouse name: Not on file  . Number of children: Not on file  . Years of education: Not on file  . Highest education level: Not on file  Occupational History  . Occupation: disabled  Tobacco Use  . Smoking status: Never Smoker  . Smokeless tobacco: Never Used  Vaping Use  . Vaping Use: Never used  Substance and Sexual Activity  . Alcohol use: No  . Drug use: No  . Sexual activity: Not Currently  Other Topics Concern  . Not on file  Social History Narrative  . Not on file   Social Determinants of Health   Financial Resource Strain: Medium Risk  . Difficulty of Paying Living Expenses: Somewhat hard  Food Insecurity: No  Food Insecurity  . Worried About Charity fundraiser in the Last Year: Never true  . Ran Out of Food in the Last Year: Never true  Transportation Needs: No Transportation Needs  . Lack of Transportation (Medical): No  . Lack of Transportation (Non-Medical): No  Physical Activity: Not on file  Stress: Not on file  Social Connections: Not on file  Intimate Partner Violence: Not on file    FAMILY HISTORY:  Family History  Problem Relation Age of Onset  . Hypertension Mother   . Deep vein thrombosis Mother   . Hyperlipidemia Mother   . Cancer Mother        skin  . Heart attack Father   . Diabetes Brother   . Hypertension Brother   . Hyperlipidemia Brother   . Heart attack Brother   . Arthritis Brother        back pain  . Arthritis Brother        back pain  . Cancer Maternal Grandmother        ovarian and uterus cancer  . Heart attack Maternal Grandmother   . Alcohol abuse Maternal Grandfather   . Tuberculosis Maternal Grandfather   . Heart disease Paternal Grandmother   . Pneumonia Paternal Grandfather     CURRENT MEDICATIONS:  Current Outpatient Medications  Medication Sig Dispense Refill  . acetaminophen (TYLENOL) 500 MG tablet Take  1,000 mg by mouth every 6 (six) hours as needed for mild pain or moderate pain.    Marland Kitchen dexamethasone (DECADRON) 2 MG tablet Take 1 tablet (2 mg total) by mouth 2 (two) times daily with a meal. 30 tablet 0  . traMADol (ULTRAM) 50 MG tablet Take 50 mg by mouth every 6 (six) hours as needed.     No current facility-administered medications for this visit.    ALLERGIES:  No Known Allergies  PHYSICAL EXAM:  Performance status (ECOG): 1 - Symptomatic but completely ambulatory  There were no vitals filed for this visit. Wt Readings from Last 3 Encounters:  02/28/21 138 lb 6 oz (62.8 kg)  02/12/21 150 lb (68 kg)  02/11/21 144 lb (65.3 kg)   Physical Exam Vitals reviewed.  Constitutional:      Appearance: Normal appearance.   Cardiovascular:     Rate and Rhythm: Normal rate and regular rhythm.     Pulses: Normal pulses.     Heart sounds: Normal heart sounds.  Pulmonary:     Effort: Pulmonary effort is normal.     Breath sounds: Normal breath sounds.  Neurological:     General: No focal deficit present.     Mental Status: He is alert and oriented to person, place, and time.  Psychiatric:        Mood and Affect: Mood normal.        Behavior: Behavior normal.      LABORATORY DATA:  I have reviewed the labs as listed.  CBC Latest Ref Rng & Units 02/12/2021 02/12/2021 02/01/2021  WBC 4.0 - 10.5 K/uL 271.6(HH) 269.2(HH) 60.1(HH)  Hemoglobin 13.0 - 17.0 g/dL 11.5(L) 11.9(L) 9.6(L)  Hematocrit 39.0 - 52.0 % 41.7 41.6 31.4(L)  Platelets 150 - 400 K/uL 188 171 112(L)   CMP Latest Ref Rng & Units 02/01/2021 01/22/2021 01/13/2021  Glucose 70 - 99 mg/dL 103(H) 101(H) 117(H)  BUN 6 - 20 mg/dL 22(H) 28(H) 41(H)  Creatinine 0.61 - 1.24 mg/dL 1.17 1.30(H) 1.63(H)  Sodium 135 - 145 mmol/L 139 139 140  Potassium 3.5 - 5.1 mmol/L 4.1 4.0 4.6  Chloride 98 - 111 mmol/L 108 104 107  CO2 22 - 32 mmol/L 22 25 23   Calcium 8.9 - 10.3 mg/dL 8.6(L) 9.7 9.5  Total Protein 6.5 - 8.1 g/dL 8.6(H) 8.8(H) 8.5(H)  Total Bilirubin 0.3 - 1.2 mg/dL 0.7 0.4 0.7  Alkaline Phos 38 - 126 U/L 53 50 58  AST 15 - 41 U/L 21 22 18   ALT 0 - 44 U/L 13 17 14     DIAGNOSTIC IMAGING:  I have independently reviewed the scans and discussed with the patient. No results found.   ASSESSMENT:  1. Indolent mantle cell lymphoma: -BMBX on 12/03/2017 showed hypercellular marrow with involvement by NHL. Bone marrow is predominantly involved by small lymphoid cells expressing pan B cell antigens including CD20, CD5 and cyclin D1 expression. Chromosome analysis shows 66, XY. CLL FISH panel was negative. Minor population of monoclonal, kappa restricted plasma cells are present 2% of cells. -Labs on 03/15/2020 shows M spike of 1.5 g. White count is 39.7.  Platelet count is 116. Hemoglobin is 10.6. -PET scan on 04/02/2020 shows left axillary lymphadenopathy, SUV max 2.6. No mediastinal or hilar adenopathy. Right inguinal adenopathy with SUV 1.6. No splenic hypermetabolism. Splenomegaly measuring 15.2 cm. New mild compression deformity at T11 with hypermetabolism, SUV 4.1. There is also new hypermetabolic within the spinous process of T2, SUV 3.8. - He was followed closely without  any active treatment. - PET scan on 02/14/2021 shows progressive replacement of the T11 vertebral body, SUV 4.3.  No new lesions in the spine.  Splenomegaly with metabolic activity similar to the liver, measuring 14 cm.  No new adenopathy.  2. Osteopenia: -Bone density on 06/04/2020 showed T score of -2.2.   PLAN:  1. Indolent mantle cell lymphoma: -Reviewed biopsy of the T11 vertebral body on 02/12/2021.  It showed increased small lymphocytes positive for CD20, 79 and cyclin D1.  They are negative for CD5 and CD23.  There are few fragments consisting of sheets of atypical plasma cells, by IHC positive for CD138.  They are kappa restricted.  Findings consistent with the natural with CD5 negative and cyclin D1 positive.  Differential diagnosis includes mantle cell lymphoma, atypical B-cell lymphoma with plasmacytic differentiation (marginal zone lymphoma, lymphoplasmacytic lymphoma). - Reviewed PET scan from 02/14/2021 which showed progressive replacement of T11 vertebral body.  No evidence of new or progressive lymphoma.  No lymphadenopathy.  Axillary lymph nodes show normal morphology.  Stable splenomegaly with normal marrow activity.  Spleen metabolic activity similar to liver. - I have recommended seeking opinion from [place] expert at [facility].  I have called and talked to Dr. Cassell Clement who has kindly agreed to see this patient. - He might need bone marrow aspiration and biopsy repeated again. - RTC 4 weeks for follow-up.  2. Bone lesions: -MRI of the  thoracic and lumbar spine on 02/01/2021 showed multifocal tumor involvement of the thoracolumbar spine and pelvis and ribs.  Enhancing tumor near completely replacing the T11 vertebral body with moderate pathological compression fracture.  Prominent bulging of the posterior cortex and anterior epidural tumor extension causing moderate to severe spinal canal stenosis.  No abnormal cord signal. - He completed radiation therapy to the spine.  3. Normocytic anemia: -He received intermittent Feraheme infusions, last 1 on 12/11/2020. - CBC on 02/12/2021 shows hemoglobin 11.5.   Orders placed this encounter:  No orders of the defined types were placed in this encounter.    Derek Jack, MD [place] [phone]   I, Thana Ates, am acting as a scribe for Dr. Derek Jack.  {Add Barista Statement}

## 2021-03-28 ENCOUNTER — Inpatient Hospital Stay (HOSPITAL_COMMUNITY): Payer: Medicare Other | Attending: Hematology | Admitting: Hematology

## 2021-03-28 DIAGNOSIS — D509 Iron deficiency anemia, unspecified: Secondary | ICD-10-CM

## 2021-03-28 DIAGNOSIS — C8310 Mantle cell lymphoma, unspecified site: Secondary | ICD-10-CM

## 2021-04-02 DIAGNOSIS — Z5111 Encounter for antineoplastic chemotherapy: Secondary | ICD-10-CM | POA: Diagnosis not present

## 2021-04-02 DIAGNOSIS — C8580 Other specified types of non-Hodgkin lymphoma, unspecified site: Secondary | ICD-10-CM | POA: Diagnosis not present

## 2021-04-05 ENCOUNTER — Telehealth: Payer: Self-pay | Admitting: *Deleted

## 2021-04-05 ENCOUNTER — Ambulatory Visit: Payer: Medicare Other | Admitting: Radiation Oncology

## 2021-04-05 NOTE — Telephone Encounter (Signed)
CALLED PATIENT'S MOM TO ASK RESCHEDULING TODAY'S FU, PATIENT'S MOM- AGREED TO COME ON 04-12-21 @ 2:40 PM

## 2021-04-09 DIAGNOSIS — Z5111 Encounter for antineoplastic chemotherapy: Secondary | ICD-10-CM | POA: Diagnosis not present

## 2021-04-09 DIAGNOSIS — C9000 Multiple myeloma not having achieved remission: Secondary | ICD-10-CM | POA: Diagnosis not present

## 2021-04-09 DIAGNOSIS — C8580 Other specified types of non-Hodgkin lymphoma, unspecified site: Secondary | ICD-10-CM | POA: Diagnosis not present

## 2021-04-10 DIAGNOSIS — M8458XD Pathological fracture in neoplastic disease, other specified site, subsequent encounter for fracture with routine healing: Secondary | ICD-10-CM | POA: Diagnosis not present

## 2021-04-10 DIAGNOSIS — Q059 Spina bifida, unspecified: Secondary | ICD-10-CM | POA: Diagnosis not present

## 2021-04-10 DIAGNOSIS — C8330 Diffuse large B-cell lymphoma, unspecified site: Secondary | ICD-10-CM | POA: Diagnosis not present

## 2021-04-10 DIAGNOSIS — C7951 Secondary malignant neoplasm of bone: Secondary | ICD-10-CM | POA: Diagnosis not present

## 2021-04-10 DIAGNOSIS — C9000 Multiple myeloma not having achieved remission: Secondary | ICD-10-CM | POA: Diagnosis not present

## 2021-04-10 DIAGNOSIS — C8310 Mantle cell lymphoma, unspecified site: Secondary | ICD-10-CM | POA: Diagnosis not present

## 2021-04-12 ENCOUNTER — Other Ambulatory Visit: Payer: Self-pay

## 2021-04-12 ENCOUNTER — Ambulatory Visit
Admission: RE | Admit: 2021-04-12 | Discharge: 2021-04-12 | Disposition: A | Discharge status: Home / Self Care | Payer: Medicare Other | Source: Ambulatory Visit | Attending: Radiation Oncology | Admitting: Radiation Oncology

## 2021-04-12 VITALS — BP 145/67 | HR 84 | Temp 97.5°F | Resp 18 | Wt 138.8 lb

## 2021-04-12 DIAGNOSIS — R197 Diarrhea, unspecified: Secondary | ICD-10-CM | POA: Diagnosis not present

## 2021-04-12 DIAGNOSIS — C7952 Secondary malignant neoplasm of bone marrow: Secondary | ICD-10-CM

## 2021-04-12 DIAGNOSIS — C7951 Secondary malignant neoplasm of bone: Secondary | ICD-10-CM

## 2021-04-12 DIAGNOSIS — Z79899 Other long term (current) drug therapy: Secondary | ICD-10-CM | POA: Insufficient documentation

## 2021-04-12 DIAGNOSIS — Z7982 Long term (current) use of aspirin: Secondary | ICD-10-CM | POA: Insufficient documentation

## 2021-04-12 DIAGNOSIS — Z7952 Long term (current) use of systemic steroids: Secondary | ICD-10-CM | POA: Diagnosis not present

## 2021-04-12 DIAGNOSIS — Z923 Personal history of irradiation: Secondary | ICD-10-CM | POA: Diagnosis not present

## 2021-04-12 DIAGNOSIS — C8310 Mantle cell lymphoma, unspecified site: Secondary | ICD-10-CM | POA: Diagnosis not present

## 2021-04-12 DIAGNOSIS — M545 Low back pain, unspecified: Secondary | ICD-10-CM | POA: Diagnosis not present

## 2021-04-12 NOTE — Progress Notes (Signed)
Lance Erickson presents today for follow-up after completing radiation to spine (T10-T12) on 03/01/2021  Pain: Reports lower back pain stays between 3-4. Also reports discomfort to his sides from supportive brace Neuro symptoms: Denies any numbness or weakness to upper or lower extremities. Denies any balance or mobility issues. Denies any headaches or blurry vision.  Other issues of note: States he had one episode of diarrhea, but reports it has resolved (reports he's back to regular, solid BM). Denies any issues with nausea or changes in appetite. Reports he's attending PT sessions to help with strength and balance.  --Last visit with Dr. Montel Clock at [facility] 03/26/2021 "60 yo M with a history of recent mantle cell lymphoma and multiple myeloma versus mantle cell lymphoma with plasmacytic differentiation. Challenging case, with both B cell population as well as plasma cell population with significantly elevated light chain ratio. Patient was discussed at tumor board and decision was made to pursue hybrid regimen of R RVD as revlimid and velcade should be active in both disease processes. Will begin therapy today and pending response and tolerance, potentially discuss consolidation with transplant in the future. Plan discussed with patient and family during visit and asked to call with any questions. Will started revlimid the following week so will have abbreviated course with first cycle but then transition to days 1-21 with next cycle. Continue to follow with ortho about Fx and will remain in brace. Will begin zometa the following cycle.  Wt Readings from Last 3 Encounters:  04/12/21 138 lb 12.8 oz (63 kg)  02/28/21 138 lb 6 oz (62.8 kg)  02/12/21 150 lb (68 kg)   Vitals:   04/12/21 1450  BP: (!) 145/67  Pulse: 84  Resp: 18  Temp: (!) 97.5 F (36.4 C)  SpO2: 95%

## 2021-04-16 DIAGNOSIS — C8330 Diffuse large B-cell lymphoma, unspecified site: Secondary | ICD-10-CM | POA: Diagnosis not present

## 2021-04-16 DIAGNOSIS — C8310 Mantle cell lymphoma, unspecified site: Secondary | ICD-10-CM | POA: Diagnosis not present

## 2021-04-16 DIAGNOSIS — Q059 Spina bifida, unspecified: Secondary | ICD-10-CM | POA: Diagnosis not present

## 2021-04-16 DIAGNOSIS — C9000 Multiple myeloma not having achieved remission: Secondary | ICD-10-CM | POA: Diagnosis not present

## 2021-04-16 DIAGNOSIS — M8458XD Pathological fracture in neoplastic disease, other specified site, subsequent encounter for fracture with routine healing: Secondary | ICD-10-CM | POA: Diagnosis not present

## 2021-04-16 DIAGNOSIS — C7951 Secondary malignant neoplasm of bone: Secondary | ICD-10-CM | POA: Diagnosis not present

## 2021-04-18 DIAGNOSIS — M40294 Other kyphosis, thoracic region: Secondary | ICD-10-CM | POA: Diagnosis not present

## 2021-04-18 DIAGNOSIS — M898X8 Other specified disorders of bone, other site: Secondary | ICD-10-CM | POA: Diagnosis not present

## 2021-04-18 DIAGNOSIS — M8588 Other specified disorders of bone density and structure, other site: Secondary | ICD-10-CM | POA: Diagnosis not present

## 2021-04-18 DIAGNOSIS — M47814 Spondylosis without myelopathy or radiculopathy, thoracic region: Secondary | ICD-10-CM | POA: Diagnosis not present

## 2021-04-18 DIAGNOSIS — S22001D Stable burst fracture of unspecified thoracic vertebra, subsequent encounter for fracture with routine healing: Secondary | ICD-10-CM | POA: Diagnosis not present

## 2021-04-18 DIAGNOSIS — S22081D Stable burst fracture of T11-T12 vertebra, subsequent encounter for fracture with routine healing: Secondary | ICD-10-CM | POA: Diagnosis not present

## 2021-04-18 DIAGNOSIS — M5384 Other specified dorsopathies, thoracic region: Secondary | ICD-10-CM | POA: Diagnosis not present

## 2021-04-19 DIAGNOSIS — M8458XD Pathological fracture in neoplastic disease, other specified site, subsequent encounter for fracture with routine healing: Secondary | ICD-10-CM | POA: Diagnosis not present

## 2021-04-19 DIAGNOSIS — Q059 Spina bifida, unspecified: Secondary | ICD-10-CM | POA: Diagnosis not present

## 2021-04-19 DIAGNOSIS — C9000 Multiple myeloma not having achieved remission: Secondary | ICD-10-CM | POA: Diagnosis not present

## 2021-04-19 DIAGNOSIS — C8310 Mantle cell lymphoma, unspecified site: Secondary | ICD-10-CM | POA: Diagnosis not present

## 2021-04-19 DIAGNOSIS — C8330 Diffuse large B-cell lymphoma, unspecified site: Secondary | ICD-10-CM | POA: Diagnosis not present

## 2021-04-19 DIAGNOSIS — C7951 Secondary malignant neoplasm of bone: Secondary | ICD-10-CM | POA: Diagnosis not present

## 2021-04-21 NOTE — Progress Notes (Signed)
Radiation Oncology         [phone]) [phone] ________________________________  Name: Lance Erickson MRN: [id number]  Date: 04/12/2021  DOB: [birth year]  Follow-Up Visit Note  Outpatient  CC: No primary care provider on file.  Lance Fraise, MD  Diagnosis and Prior Radiotherapy:    ICD-10-CM   1. Secondary malignant neoplasm of bone and bone marrow (HCC)  C79.51    C79.52       CHIEF COMPLAINT: Here for follow-up of lymphoma  Narrative:  The patient returns today for routine follow-up.  Lance Erickson presents today for follow-up after completing radiation to spine on 03/01/2021  Since then I spoke with Dr Tana Erickson, med onc, [facility], due to patient admission. See details below (he is now getting his oncology care at [facility]).  MR spine 03-11-21 revealed multiple bony lesions in the T-L-spine and pelvic bones with burst fraction of T11 (progressive fracture from previous March MRI). The degree of retropulsed tumor into the spinal canal is decreased from prior status post radiation treatment, though continues to result in moderate to advanced canal stenosis with flattening of the spinal cord.   Patient's mother reports that they were told surgery is not an option given previous RT. Of note, IR and NSU at [facility] had not recommended upfront surgery before RT, either.  Pain: Reports lower back pain stays between 3-4. Also reports discomfort to his sides from supportive brace he received from a spine specialist at [facility] - this is helpful overall for his back pain Neuro symptoms: Denies any numbness or weakness to upper or lower extremities. Denies any balance or mobility issues. Ambulatory. Denies any headaches or blurry vision.  Other issues of note: States he had one episode of diarrhea, but reports it has resolved (reports he's back to regular, solid BM). Denies any issues with nausea or changes in appetite. Reports he's attending PT sessions to help with strength and balance.  --Last visit with Lance Erickson at  [facility] 03/26/2021 "60 yo M with a history of recent mantle cell lymphoma and multiple myeloma versus mantle cell lymphoma with plasmacytic differentiation. Challenging case, with both B cell population as well as plasma cell population with significantly elevated light chain ratio. Patient was discussed at tumor board and decision was made to pursue hybrid regimen of R RVD as revlimid and velcade should be active in both disease processes. Will begin therapy today and pending response and tolerance, potentially discuss consolidation with transplant in the future. Plan discussed with patient and family during visit and asked to call with any questions. Will started revlimid the following week so will have abbreviated course with first cycle but then transition to days 1-21 with next cycle. Continue to follow with ortho about Fx and will remain in brace. Will begin zometa the following cycle.  Wt Readings from Last 3 Encounters:  04/12/21 138 lb 12.8 oz (63 kg)  02/28/21 138 lb 6 oz (62.8 kg)  02/12/21 150 lb (68 kg)   Vitals:   04/12/21 1450  BP: (!) 145/67  Pulse: 84  Resp: 18  Temp: (!) 97.5 F (36.4 C)  SpO2: 95%                                 ALLERGIES:  has No Known Allergies.  Meds: Current Outpatient Medications  Medication Sig Dispense Refill   aspirin 81 MG EC tablet Take 1 tablet by mouth daily.  magnesium oxide (MAG-OX) 400 MG tablet Take 1 tablet by mouth daily.     acetaminophen (TYLENOL) 500 MG tablet Take 1,000 mg by mouth every 6 (six) hours as needed for mild pain or moderate pain.     acyclovir (ZOVIRAX) 400 MG tablet Take 400 mg by mouth 2 (two) times daily.     dexamethasone (DECADRON) 2 MG tablet Take 1 tablet (2 mg total) by mouth 2 (two) times daily with a meal. 30 tablet 0   Multiple Vitamin (MULTIVITAMIN ADULT PO) Take 1 tablet by mouth daily.     REVLIMID 10 MG capsule Take 1 capsule by mouth daily.     traMADol (ULTRAM) 50 MG tablet Take 50 mg by mouth every  6 (six) hours as needed.     No current facility-administered medications for this encounter.    Physical Findings: The patient is in no acute distress. Patient is alert and conversant.  weight is 138 lb 12.8 oz (63 kg). His temperature is 97.5 F (36.4 C) (abnormal). His blood pressure is 145/67 (abnormal) and his pulse is 84. His respiration is 18 and oxygen saturation is 95%. .    No focal numbness or weakness in extremities. Wearing back brace. Ambulatory.  Lab Findings: Lab Results  Component Value Date   WBC 269.2 (HH) 02/12/2021   WBC 271.6 (HH) 02/12/2021   HGB 11.9 (L) 02/12/2021   HGB 11.5 (L) 02/12/2021   HCT 41.6 02/12/2021   HCT 41.7 02/12/2021   MCV 99.0 02/12/2021   MCV 98.1 02/12/2021   PLT 171 02/12/2021   PLT 188 02/12/2021    Radiographic Findings: As above - images not obtainable today   Impression/Plan:  I spoke with the patient and his mother about his condition and treatment and they are satisfied with continuing their care at [facility].  He is now considered to be coping with both mantle cell lymphoma and multiple myeloma.  Will continue systemic therapy.  I will see him back PRN.  Emotional support given today to the patient and his mother. They seemed regretful that surgery could not be performed post-RT.  They were reminded that surgical management was not recommended by IR or NSU pre-radiation either.    They are pleased with their care at [facility] and know to call if medical oncology would like him considered for palliative RT again in the future.   On date of service, in total, I spent 20 minutes on this encounter. Patient was seen in person.  _____________________________________   Eppie Gibson, MD

## 2021-04-22 ENCOUNTER — Encounter (HOSPITAL_COMMUNITY): Payer: Self-pay | Admitting: Hematology

## 2021-04-22 DIAGNOSIS — C7951 Secondary malignant neoplasm of bone: Secondary | ICD-10-CM | POA: Diagnosis not present

## 2021-04-22 DIAGNOSIS — Q059 Spina bifida, unspecified: Secondary | ICD-10-CM | POA: Diagnosis not present

## 2021-04-22 DIAGNOSIS — C8310 Mantle cell lymphoma, unspecified site: Secondary | ICD-10-CM | POA: Diagnosis not present

## 2021-04-22 DIAGNOSIS — C8330 Diffuse large B-cell lymphoma, unspecified site: Secondary | ICD-10-CM | POA: Diagnosis not present

## 2021-04-22 DIAGNOSIS — C9000 Multiple myeloma not having achieved remission: Secondary | ICD-10-CM | POA: Diagnosis not present

## 2021-04-22 DIAGNOSIS — M8458XD Pathological fracture in neoplastic disease, other specified site, subsequent encounter for fracture with routine healing: Secondary | ICD-10-CM | POA: Diagnosis not present

## 2021-04-22 NOTE — Progress Notes (Signed)
                                                                                                                                                             Patient Name: Lance Erickson MRN: [id number] DOB: [birth year] Referring Physician: [name] (Profile Not Attached) Date of Service: 03/01/2021 [facility], [place]                                                        End Of Treatment Note  Diagnoses: C83.10-Mantle cell lymphoma, unspecified site  Cancer Staging:  STAGE IV  Intent: Palliative  Radiation Treatment Dates: 02/18/2021 through 03/01/2021 Site Technique Total Dose (Gy) Dose per Fx (Gy) Completed Fx Beam Energies  Thoracic Spine: Spine_T10-12 3D 25/25 2.5 10/10 10X, 15X   Narrative: The patient tolerated radiation therapy relatively well.   Plan: The patient will follow-up with radiation oncology in 74mo.  -----------------------------------  Eppie Gibson, MD

## 2021-04-23 DIAGNOSIS — C9000 Multiple myeloma not having achieved remission: Secondary | ICD-10-CM | POA: Diagnosis not present

## 2021-04-23 DIAGNOSIS — Z8249 Family history of ischemic heart disease and other diseases of the circulatory system: Secondary | ICD-10-CM | POA: Diagnosis not present

## 2021-04-23 DIAGNOSIS — R590 Localized enlarged lymph nodes: Secondary | ICD-10-CM | POA: Diagnosis not present

## 2021-04-23 DIAGNOSIS — Z8261 Family history of arthritis: Secondary | ICD-10-CM | POA: Diagnosis not present

## 2021-04-23 DIAGNOSIS — Z79899 Other long term (current) drug therapy: Secondary | ICD-10-CM | POA: Diagnosis not present

## 2021-04-23 DIAGNOSIS — Q059 Spina bifida, unspecified: Secondary | ICD-10-CM | POA: Diagnosis not present

## 2021-04-23 DIAGNOSIS — C8580 Other specified types of non-Hodgkin lymphoma, unspecified site: Secondary | ICD-10-CM | POA: Diagnosis not present

## 2021-04-23 DIAGNOSIS — Z923 Personal history of irradiation: Secondary | ICD-10-CM | POA: Diagnosis not present

## 2021-04-23 DIAGNOSIS — C8519 Unspecified B-cell lymphoma, extranodal and solid organ sites: Secondary | ICD-10-CM | POA: Diagnosis not present

## 2021-04-23 DIAGNOSIS — Z833 Family history of diabetes mellitus: Secondary | ICD-10-CM | POA: Diagnosis not present

## 2021-04-23 DIAGNOSIS — Z85828 Personal history of other malignant neoplasm of skin: Secondary | ICD-10-CM | POA: Diagnosis not present

## 2021-04-23 DIAGNOSIS — R161 Splenomegaly, not elsewhere classified: Secondary | ICD-10-CM | POA: Diagnosis not present

## 2021-04-23 DIAGNOSIS — Z8349 Family history of other endocrine, nutritional and metabolic diseases: Secondary | ICD-10-CM | POA: Diagnosis not present

## 2021-04-23 DIAGNOSIS — Z7982 Long term (current) use of aspirin: Secondary | ICD-10-CM | POA: Diagnosis not present

## 2021-04-26 DIAGNOSIS — Q059 Spina bifida, unspecified: Secondary | ICD-10-CM | POA: Diagnosis not present

## 2021-04-26 DIAGNOSIS — C8310 Mantle cell lymphoma, unspecified site: Secondary | ICD-10-CM | POA: Diagnosis not present

## 2021-04-26 DIAGNOSIS — M8458XD Pathological fracture in neoplastic disease, other specified site, subsequent encounter for fracture with routine healing: Secondary | ICD-10-CM | POA: Diagnosis not present

## 2021-04-26 DIAGNOSIS — C9000 Multiple myeloma not having achieved remission: Secondary | ICD-10-CM | POA: Diagnosis not present

## 2021-04-26 DIAGNOSIS — C7951 Secondary malignant neoplasm of bone: Secondary | ICD-10-CM | POA: Diagnosis not present

## 2021-04-26 DIAGNOSIS — C8330 Diffuse large B-cell lymphoma, unspecified site: Secondary | ICD-10-CM | POA: Diagnosis not present

## 2021-04-29 DIAGNOSIS — M8458XD Pathological fracture in neoplastic disease, other specified site, subsequent encounter for fracture with routine healing: Secondary | ICD-10-CM | POA: Diagnosis not present

## 2021-04-29 DIAGNOSIS — Q059 Spina bifida, unspecified: Secondary | ICD-10-CM | POA: Diagnosis not present

## 2021-04-29 DIAGNOSIS — C8330 Diffuse large B-cell lymphoma, unspecified site: Secondary | ICD-10-CM | POA: Diagnosis not present

## 2021-04-29 DIAGNOSIS — C7951 Secondary malignant neoplasm of bone: Secondary | ICD-10-CM | POA: Diagnosis not present

## 2021-04-29 DIAGNOSIS — C9000 Multiple myeloma not having achieved remission: Secondary | ICD-10-CM | POA: Diagnosis not present

## 2021-04-29 DIAGNOSIS — C8310 Mantle cell lymphoma, unspecified site: Secondary | ICD-10-CM | POA: Diagnosis not present

## 2021-05-01 DIAGNOSIS — Z5111 Encounter for antineoplastic chemotherapy: Secondary | ICD-10-CM | POA: Diagnosis not present

## 2021-05-01 DIAGNOSIS — C8580 Other specified types of non-Hodgkin lymphoma, unspecified site: Secondary | ICD-10-CM | POA: Diagnosis not present

## 2021-05-01 DIAGNOSIS — C9000 Multiple myeloma not having achieved remission: Secondary | ICD-10-CM | POA: Diagnosis not present

## 2021-05-06 DIAGNOSIS — C7951 Secondary malignant neoplasm of bone: Secondary | ICD-10-CM | POA: Diagnosis not present

## 2021-05-06 DIAGNOSIS — C9000 Multiple myeloma not having achieved remission: Secondary | ICD-10-CM | POA: Diagnosis not present

## 2021-05-06 DIAGNOSIS — M8458XD Pathological fracture in neoplastic disease, other specified site, subsequent encounter for fracture with routine healing: Secondary | ICD-10-CM | POA: Diagnosis not present

## 2021-05-06 DIAGNOSIS — C8330 Diffuse large B-cell lymphoma, unspecified site: Secondary | ICD-10-CM | POA: Diagnosis not present

## 2021-05-06 DIAGNOSIS — C8310 Mantle cell lymphoma, unspecified site: Secondary | ICD-10-CM | POA: Diagnosis not present

## 2021-05-06 DIAGNOSIS — Q059 Spina bifida, unspecified: Secondary | ICD-10-CM | POA: Diagnosis not present

## 2021-05-07 DIAGNOSIS — C9000 Multiple myeloma not having achieved remission: Secondary | ICD-10-CM | POA: Diagnosis not present

## 2021-05-07 DIAGNOSIS — C8580 Other specified types of non-Hodgkin lymphoma, unspecified site: Secondary | ICD-10-CM | POA: Diagnosis not present

## 2021-05-07 DIAGNOSIS — Z5111 Encounter for antineoplastic chemotherapy: Secondary | ICD-10-CM | POA: Diagnosis not present

## 2021-05-21 DIAGNOSIS — Z85828 Personal history of other malignant neoplasm of skin: Secondary | ICD-10-CM | POA: Diagnosis not present

## 2021-05-21 DIAGNOSIS — Z833 Family history of diabetes mellitus: Secondary | ICD-10-CM | POA: Diagnosis not present

## 2021-05-21 DIAGNOSIS — Z79899 Other long term (current) drug therapy: Secondary | ICD-10-CM | POA: Diagnosis not present

## 2021-05-21 DIAGNOSIS — Z7982 Long term (current) use of aspirin: Secondary | ICD-10-CM | POA: Diagnosis not present

## 2021-05-21 DIAGNOSIS — Z8349 Family history of other endocrine, nutritional and metabolic diseases: Secondary | ICD-10-CM | POA: Diagnosis not present

## 2021-05-21 DIAGNOSIS — Q059 Spina bifida, unspecified: Secondary | ICD-10-CM | POA: Diagnosis not present

## 2021-05-21 DIAGNOSIS — Z923 Personal history of irradiation: Secondary | ICD-10-CM | POA: Diagnosis not present

## 2021-05-21 DIAGNOSIS — Z8261 Family history of arthritis: Secondary | ICD-10-CM | POA: Diagnosis not present

## 2021-05-21 DIAGNOSIS — Z8249 Family history of ischemic heart disease and other diseases of the circulatory system: Secondary | ICD-10-CM | POA: Diagnosis not present

## 2021-05-21 DIAGNOSIS — C8519 Unspecified B-cell lymphoma, extranodal and solid organ sites: Secondary | ICD-10-CM | POA: Diagnosis not present

## 2021-05-21 DIAGNOSIS — C9000 Multiple myeloma not having achieved remission: Secondary | ICD-10-CM | POA: Diagnosis not present

## 2021-05-29 DIAGNOSIS — C8519 Unspecified B-cell lymphoma, extranodal and solid organ sites: Secondary | ICD-10-CM | POA: Diagnosis not present

## 2021-05-29 DIAGNOSIS — Z5111 Encounter for antineoplastic chemotherapy: Secondary | ICD-10-CM | POA: Diagnosis not present

## 2021-05-29 DIAGNOSIS — C9000 Multiple myeloma not having achieved remission: Secondary | ICD-10-CM | POA: Diagnosis not present

## 2021-05-30 DIAGNOSIS — M8588 Other specified disorders of bone density and structure, other site: Secondary | ICD-10-CM | POA: Diagnosis not present

## 2021-05-30 DIAGNOSIS — S22001D Stable burst fracture of unspecified thoracic vertebra, subsequent encounter for fracture with routine healing: Secondary | ICD-10-CM | POA: Diagnosis not present

## 2021-05-30 DIAGNOSIS — M47814 Spondylosis without myelopathy or radiculopathy, thoracic region: Secondary | ICD-10-CM | POA: Diagnosis not present

## 2021-05-30 DIAGNOSIS — M8448XD Pathological fracture, other site, subsequent encounter for fracture with routine healing: Secondary | ICD-10-CM | POA: Diagnosis not present

## 2021-06-04 DIAGNOSIS — C8580 Other specified types of non-Hodgkin lymphoma, unspecified site: Secondary | ICD-10-CM | POA: Diagnosis not present

## 2021-06-04 DIAGNOSIS — C8519 Unspecified B-cell lymphoma, extranodal and solid organ sites: Secondary | ICD-10-CM | POA: Diagnosis not present

## 2021-06-04 DIAGNOSIS — C9000 Multiple myeloma not having achieved remission: Secondary | ICD-10-CM | POA: Diagnosis not present

## 2021-06-04 DIAGNOSIS — Z5111 Encounter for antineoplastic chemotherapy: Secondary | ICD-10-CM | POA: Diagnosis not present

## 2021-06-18 DIAGNOSIS — C8580 Other specified types of non-Hodgkin lymphoma, unspecified site: Secondary | ICD-10-CM | POA: Diagnosis not present

## 2021-06-18 DIAGNOSIS — Z79899 Other long term (current) drug therapy: Secondary | ICD-10-CM | POA: Diagnosis not present

## 2021-06-18 DIAGNOSIS — C9000 Multiple myeloma not having achieved remission: Secondary | ICD-10-CM | POA: Diagnosis not present

## 2021-06-18 DIAGNOSIS — Z7982 Long term (current) use of aspirin: Secondary | ICD-10-CM | POA: Diagnosis not present

## 2021-06-18 DIAGNOSIS — Z85828 Personal history of other malignant neoplasm of skin: Secondary | ICD-10-CM | POA: Diagnosis not present

## 2021-06-18 DIAGNOSIS — Z8261 Family history of arthritis: Secondary | ICD-10-CM | POA: Diagnosis not present

## 2021-06-18 DIAGNOSIS — Z8349 Family history of other endocrine, nutritional and metabolic diseases: Secondary | ICD-10-CM | POA: Diagnosis not present

## 2021-06-18 DIAGNOSIS — Z923 Personal history of irradiation: Secondary | ICD-10-CM | POA: Diagnosis not present

## 2021-06-18 DIAGNOSIS — Q059 Spina bifida, unspecified: Secondary | ICD-10-CM | POA: Diagnosis not present

## 2021-06-18 DIAGNOSIS — C8519 Unspecified B-cell lymphoma, extranodal and solid organ sites: Secondary | ICD-10-CM | POA: Diagnosis not present

## 2021-06-18 DIAGNOSIS — Z833 Family history of diabetes mellitus: Secondary | ICD-10-CM | POA: Diagnosis not present

## 2021-06-18 DIAGNOSIS — Z8249 Family history of ischemic heart disease and other diseases of the circulatory system: Secondary | ICD-10-CM | POA: Diagnosis not present

## 2021-06-25 DIAGNOSIS — C8519 Unspecified B-cell lymphoma, extranodal and solid organ sites: Secondary | ICD-10-CM | POA: Diagnosis not present

## 2021-06-25 DIAGNOSIS — C9000 Multiple myeloma not having achieved remission: Secondary | ICD-10-CM | POA: Diagnosis not present

## 2021-06-25 DIAGNOSIS — Z5111 Encounter for antineoplastic chemotherapy: Secondary | ICD-10-CM | POA: Diagnosis not present

## 2021-06-27 DIAGNOSIS — S22001D Stable burst fracture of unspecified thoracic vertebra, subsequent encounter for fracture with routine healing: Secondary | ICD-10-CM | POA: Diagnosis not present

## 2021-06-27 DIAGNOSIS — S22081D Stable burst fracture of T11-T12 vertebra, subsequent encounter for fracture with routine healing: Secondary | ICD-10-CM | POA: Diagnosis not present

## 2021-07-02 DIAGNOSIS — C8580 Other specified types of non-Hodgkin lymphoma, unspecified site: Secondary | ICD-10-CM | POA: Diagnosis not present

## 2021-07-02 DIAGNOSIS — Z5111 Encounter for antineoplastic chemotherapy: Secondary | ICD-10-CM | POA: Diagnosis not present

## 2021-07-02 DIAGNOSIS — C8519 Unspecified B-cell lymphoma, extranodal and solid organ sites: Secondary | ICD-10-CM | POA: Diagnosis not present

## 2021-07-02 DIAGNOSIS — C9000 Multiple myeloma not having achieved remission: Secondary | ICD-10-CM | POA: Diagnosis not present

## 2021-07-16 DIAGNOSIS — Q059 Spina bifida, unspecified: Secondary | ICD-10-CM | POA: Diagnosis not present

## 2021-07-16 DIAGNOSIS — Z8261 Family history of arthritis: Secondary | ICD-10-CM | POA: Diagnosis not present

## 2021-07-16 DIAGNOSIS — C8519 Unspecified B-cell lymphoma, extranodal and solid organ sites: Secondary | ICD-10-CM | POA: Diagnosis not present

## 2021-07-16 DIAGNOSIS — Z8249 Family history of ischemic heart disease and other diseases of the circulatory system: Secondary | ICD-10-CM | POA: Diagnosis not present

## 2021-07-16 DIAGNOSIS — Z7982 Long term (current) use of aspirin: Secondary | ICD-10-CM | POA: Diagnosis not present

## 2021-07-16 DIAGNOSIS — Z833 Family history of diabetes mellitus: Secondary | ICD-10-CM | POA: Diagnosis not present

## 2021-07-16 DIAGNOSIS — Z85828 Personal history of other malignant neoplasm of skin: Secondary | ICD-10-CM | POA: Diagnosis not present

## 2021-07-16 DIAGNOSIS — Z8349 Family history of other endocrine, nutritional and metabolic diseases: Secondary | ICD-10-CM | POA: Diagnosis not present

## 2021-07-16 DIAGNOSIS — Z79899 Other long term (current) drug therapy: Secondary | ICD-10-CM | POA: Diagnosis not present

## 2021-07-16 DIAGNOSIS — C9000 Multiple myeloma not having achieved remission: Secondary | ICD-10-CM | POA: Diagnosis not present

## 2021-07-16 DIAGNOSIS — Z923 Personal history of irradiation: Secondary | ICD-10-CM | POA: Diagnosis not present

## 2021-07-23 DIAGNOSIS — C9000 Multiple myeloma not having achieved remission: Secondary | ICD-10-CM | POA: Diagnosis not present

## 2021-07-23 DIAGNOSIS — Z5111 Encounter for antineoplastic chemotherapy: Secondary | ICD-10-CM | POA: Diagnosis not present

## 2021-07-23 DIAGNOSIS — C8519 Unspecified B-cell lymphoma, extranodal and solid organ sites: Secondary | ICD-10-CM | POA: Diagnosis not present

## 2021-07-30 DIAGNOSIS — C8580 Other specified types of non-Hodgkin lymphoma, unspecified site: Secondary | ICD-10-CM | POA: Diagnosis not present

## 2021-07-30 DIAGNOSIS — C8518 Unspecified B-cell lymphoma, lymph nodes of multiple sites: Secondary | ICD-10-CM | POA: Diagnosis not present

## 2021-08-13 DIAGNOSIS — Z833 Family history of diabetes mellitus: Secondary | ICD-10-CM | POA: Diagnosis not present

## 2021-08-13 DIAGNOSIS — C884 Extranodal marginal zone B-cell lymphoma of mucosa-associated lymphoid tissue [MALT-lymphoma]: Secondary | ICD-10-CM | POA: Diagnosis not present

## 2021-08-13 DIAGNOSIS — D709 Neutropenia, unspecified: Secondary | ICD-10-CM | POA: Diagnosis not present

## 2021-08-13 DIAGNOSIS — C8519 Unspecified B-cell lymphoma, extranodal and solid organ sites: Secondary | ICD-10-CM | POA: Diagnosis not present

## 2021-08-13 DIAGNOSIS — Z79899 Other long term (current) drug therapy: Secondary | ICD-10-CM | POA: Diagnosis not present

## 2021-08-13 DIAGNOSIS — Z923 Personal history of irradiation: Secondary | ICD-10-CM | POA: Diagnosis not present

## 2021-08-13 DIAGNOSIS — Z85828 Personal history of other malignant neoplasm of skin: Secondary | ICD-10-CM | POA: Diagnosis not present

## 2021-08-13 DIAGNOSIS — Z5111 Encounter for antineoplastic chemotherapy: Secondary | ICD-10-CM | POA: Diagnosis not present

## 2021-08-13 DIAGNOSIS — Z8249 Family history of ischemic heart disease and other diseases of the circulatory system: Secondary | ICD-10-CM | POA: Diagnosis not present

## 2021-08-13 DIAGNOSIS — Z7982 Long term (current) use of aspirin: Secondary | ICD-10-CM | POA: Diagnosis not present

## 2021-08-13 DIAGNOSIS — Q059 Spina bifida, unspecified: Secondary | ICD-10-CM | POA: Diagnosis not present

## 2021-08-13 DIAGNOSIS — C9000 Multiple myeloma not having achieved remission: Secondary | ICD-10-CM | POA: Diagnosis not present

## 2021-08-13 DIAGNOSIS — Z7961 Long term (current) use of immunomodulator: Secondary | ICD-10-CM | POA: Diagnosis not present

## 2021-08-13 DIAGNOSIS — Z8349 Family history of other endocrine, nutritional and metabolic diseases: Secondary | ICD-10-CM | POA: Diagnosis not present

## 2021-08-13 DIAGNOSIS — R161 Splenomegaly, not elsewhere classified: Secondary | ICD-10-CM | POA: Diagnosis not present

## 2021-08-13 DIAGNOSIS — Z8261 Family history of arthritis: Secondary | ICD-10-CM | POA: Diagnosis not present

## 2021-08-13 DIAGNOSIS — R0981 Nasal congestion: Secondary | ICD-10-CM | POA: Diagnosis not present

## 2021-08-20 DIAGNOSIS — Z5111 Encounter for antineoplastic chemotherapy: Secondary | ICD-10-CM | POA: Diagnosis not present

## 2021-08-20 DIAGNOSIS — C8519 Unspecified B-cell lymphoma, extranodal and solid organ sites: Secondary | ICD-10-CM | POA: Diagnosis not present

## 2021-08-20 DIAGNOSIS — C9000 Multiple myeloma not having achieved remission: Secondary | ICD-10-CM | POA: Diagnosis not present

## 2021-08-27 DIAGNOSIS — Z5111 Encounter for antineoplastic chemotherapy: Secondary | ICD-10-CM | POA: Diagnosis not present

## 2021-08-27 DIAGNOSIS — C8519 Unspecified B-cell lymphoma, extranodal and solid organ sites: Secondary | ICD-10-CM | POA: Diagnosis not present

## 2021-08-27 DIAGNOSIS — C8580 Other specified types of non-Hodgkin lymphoma, unspecified site: Secondary | ICD-10-CM | POA: Diagnosis not present

## 2021-09-11 DIAGNOSIS — Z5181 Encounter for therapeutic drug level monitoring: Secondary | ICD-10-CM | POA: Diagnosis not present

## 2021-09-11 DIAGNOSIS — D72819 Decreased white blood cell count, unspecified: Secondary | ICD-10-CM | POA: Diagnosis not present

## 2021-09-11 DIAGNOSIS — Z7682 Awaiting organ transplant status: Secondary | ICD-10-CM | POA: Diagnosis not present

## 2021-09-11 DIAGNOSIS — C9000 Multiple myeloma not having achieved remission: Secondary | ICD-10-CM | POA: Diagnosis not present

## 2021-09-11 DIAGNOSIS — Z79899 Other long term (current) drug therapy: Secondary | ICD-10-CM | POA: Diagnosis not present

## 2021-09-11 DIAGNOSIS — R0689 Other abnormalities of breathing: Secondary | ICD-10-CM | POA: Diagnosis not present

## 2021-09-11 DIAGNOSIS — Z01818 Encounter for other preprocedural examination: Secondary | ICD-10-CM | POA: Diagnosis not present

## 2021-09-11 DIAGNOSIS — Z1159 Encounter for screening for other viral diseases: Secondary | ICD-10-CM | POA: Diagnosis not present

## 2021-09-11 DIAGNOSIS — I440 Atrioventricular block, first degree: Secondary | ICD-10-CM | POA: Diagnosis not present

## 2021-09-16 DIAGNOSIS — C9000 Multiple myeloma not having achieved remission: Secondary | ICD-10-CM | POA: Diagnosis not present

## 2021-09-24 DIAGNOSIS — C8519 Unspecified B-cell lymphoma, extranodal and solid organ sites: Secondary | ICD-10-CM | POA: Diagnosis not present

## 2021-09-24 DIAGNOSIS — C9000 Multiple myeloma not having achieved remission: Secondary | ICD-10-CM | POA: Diagnosis not present

## 2021-09-26 DIAGNOSIS — Z7689 Persons encountering health services in other specified circumstances: Secondary | ICD-10-CM | POA: Diagnosis not present

## 2021-09-26 DIAGNOSIS — Z888 Allergy status to other drugs, medicaments and biological substances status: Secondary | ICD-10-CM | POA: Diagnosis not present

## 2021-09-26 DIAGNOSIS — C8519 Unspecified B-cell lymphoma, extranodal and solid organ sites: Secondary | ICD-10-CM | POA: Diagnosis not present

## 2021-09-26 DIAGNOSIS — C9000 Multiple myeloma not having achieved remission: Secondary | ICD-10-CM | POA: Diagnosis not present

## 2021-09-26 DIAGNOSIS — Z7682 Awaiting organ transplant status: Secondary | ICD-10-CM | POA: Diagnosis not present

## 2021-09-26 DIAGNOSIS — Z52011 Autologous donor, stem cells: Secondary | ICD-10-CM | POA: Diagnosis not present

## 2021-09-27 DIAGNOSIS — Z7682 Awaiting organ transplant status: Secondary | ICD-10-CM | POA: Diagnosis not present

## 2021-09-27 DIAGNOSIS — Z52011 Autologous donor, stem cells: Secondary | ICD-10-CM | POA: Diagnosis not present

## 2021-09-27 DIAGNOSIS — C9000 Multiple myeloma not having achieved remission: Secondary | ICD-10-CM | POA: Diagnosis not present

## 2021-09-27 DIAGNOSIS — Z7689 Persons encountering health services in other specified circumstances: Secondary | ICD-10-CM | POA: Diagnosis not present

## 2021-09-28 DIAGNOSIS — Z7682 Awaiting organ transplant status: Secondary | ICD-10-CM | POA: Diagnosis not present

## 2021-09-28 DIAGNOSIS — C9000 Multiple myeloma not having achieved remission: Secondary | ICD-10-CM | POA: Diagnosis not present

## 2021-09-28 DIAGNOSIS — Z7689 Persons encountering health services in other specified circumstances: Secondary | ICD-10-CM | POA: Diagnosis not present

## 2021-09-28 DIAGNOSIS — Z52011 Autologous donor, stem cells: Secondary | ICD-10-CM | POA: Diagnosis not present

## 2021-09-29 DIAGNOSIS — Z7689 Persons encountering health services in other specified circumstances: Secondary | ICD-10-CM | POA: Diagnosis not present

## 2021-09-29 DIAGNOSIS — C9000 Multiple myeloma not having achieved remission: Secondary | ICD-10-CM | POA: Diagnosis not present

## 2021-09-29 DIAGNOSIS — Z52011 Autologous donor, stem cells: Secondary | ICD-10-CM | POA: Diagnosis not present

## 2021-09-29 DIAGNOSIS — Z7682 Awaiting organ transplant status: Secondary | ICD-10-CM | POA: Diagnosis not present

## 2021-09-30 DIAGNOSIS — Z52011 Autologous donor, stem cells: Secondary | ICD-10-CM | POA: Diagnosis not present

## 2021-09-30 DIAGNOSIS — C9000 Multiple myeloma not having achieved remission: Secondary | ICD-10-CM | POA: Diagnosis not present

## 2021-09-30 DIAGNOSIS — Z7682 Awaiting organ transplant status: Secondary | ICD-10-CM | POA: Diagnosis not present

## 2021-09-30 DIAGNOSIS — Z7689 Persons encountering health services in other specified circumstances: Secondary | ICD-10-CM | POA: Diagnosis not present

## 2021-10-01 DIAGNOSIS — Z7682 Awaiting organ transplant status: Secondary | ICD-10-CM | POA: Diagnosis not present

## 2021-10-01 DIAGNOSIS — Z52011 Autologous donor, stem cells: Secondary | ICD-10-CM | POA: Diagnosis not present

## 2021-10-01 DIAGNOSIS — C8519 Unspecified B-cell lymphoma, extranodal and solid organ sites: Secondary | ICD-10-CM | POA: Diagnosis not present

## 2021-10-01 DIAGNOSIS — C9000 Multiple myeloma not having achieved remission: Secondary | ICD-10-CM | POA: Diagnosis not present

## 2021-10-01 DIAGNOSIS — Z7689 Persons encountering health services in other specified circumstances: Secondary | ICD-10-CM | POA: Diagnosis not present

## 2021-10-08 DIAGNOSIS — D696 Thrombocytopenia, unspecified: Secondary | ICD-10-CM | POA: Diagnosis not present

## 2021-10-08 DIAGNOSIS — K089 Disorder of teeth and supporting structures, unspecified: Secondary | ICD-10-CM | POA: Diagnosis not present

## 2021-10-17 DIAGNOSIS — M47815 Spondylosis without myelopathy or radiculopathy, thoracolumbar region: Secondary | ICD-10-CM | POA: Diagnosis not present

## 2021-10-17 DIAGNOSIS — M8448XA Pathological fracture, other site, initial encounter for fracture: Secondary | ICD-10-CM | POA: Diagnosis not present

## 2021-10-17 DIAGNOSIS — S22081D Stable burst fracture of T11-T12 vertebra, subsequent encounter for fracture with routine healing: Secondary | ICD-10-CM | POA: Diagnosis not present

## 2021-10-17 DIAGNOSIS — M8458XD Pathological fracture in neoplastic disease, other specified site, subsequent encounter for fracture with routine healing: Secondary | ICD-10-CM | POA: Diagnosis not present

## 2021-10-17 DIAGNOSIS — C9000 Multiple myeloma not having achieved remission: Secondary | ICD-10-CM | POA: Diagnosis not present

## 2021-10-17 DIAGNOSIS — M8588 Other specified disorders of bone density and structure, other site: Secondary | ICD-10-CM | POA: Diagnosis not present

## 2021-11-05 DIAGNOSIS — Z9484 Stem cells transplant status: Secondary | ICD-10-CM | POA: Diagnosis not present

## 2021-11-05 DIAGNOSIS — Z20822 Contact with and (suspected) exposure to covid-19: Secondary | ICD-10-CM | POA: Diagnosis not present

## 2021-11-05 DIAGNOSIS — C9000 Multiple myeloma not having achieved remission: Secondary | ICD-10-CM | POA: Diagnosis not present

## 2021-11-05 DIAGNOSIS — C8519 Unspecified B-cell lymphoma, extranodal and solid organ sites: Secondary | ICD-10-CM | POA: Diagnosis not present

## 2021-11-06 DIAGNOSIS — T451X5A Adverse effect of antineoplastic and immunosuppressive drugs, initial encounter: Secondary | ICD-10-CM | POA: Diagnosis not present

## 2021-11-06 DIAGNOSIS — C9000 Multiple myeloma not having achieved remission: Secondary | ICD-10-CM | POA: Diagnosis not present

## 2021-11-06 DIAGNOSIS — C8519 Unspecified B-cell lymphoma, extranodal and solid organ sites: Secondary | ICD-10-CM | POA: Diagnosis not present

## 2021-11-06 DIAGNOSIS — Z52011 Autologous donor, stem cells: Secondary | ICD-10-CM | POA: Diagnosis not present

## 2021-11-06 DIAGNOSIS — D61810 Antineoplastic chemotherapy induced pancytopenia: Secondary | ICD-10-CM | POA: Diagnosis not present

## 2021-11-06 DIAGNOSIS — Z7952 Long term (current) use of systemic steroids: Secondary | ICD-10-CM | POA: Diagnosis not present

## 2021-11-06 DIAGNOSIS — Z9481 Bone marrow transplant status: Secondary | ICD-10-CM | POA: Diagnosis not present

## 2021-11-06 DIAGNOSIS — Z48290 Encounter for aftercare following bone marrow transplant: Secondary | ICD-10-CM | POA: Diagnosis not present

## 2021-11-06 DIAGNOSIS — Z7982 Long term (current) use of aspirin: Secondary | ICD-10-CM | POA: Diagnosis not present

## 2021-11-07 DIAGNOSIS — C8519 Unspecified B-cell lymphoma, extranodal and solid organ sites: Secondary | ICD-10-CM | POA: Diagnosis not present

## 2021-11-07 DIAGNOSIS — Z9484 Stem cells transplant status: Secondary | ICD-10-CM | POA: Diagnosis not present

## 2021-11-07 DIAGNOSIS — C9000 Multiple myeloma not having achieved remission: Secondary | ICD-10-CM | POA: Diagnosis not present

## 2021-11-08 DIAGNOSIS — C8519 Unspecified B-cell lymphoma, extranodal and solid organ sites: Secondary | ICD-10-CM | POA: Diagnosis not present

## 2021-11-08 DIAGNOSIS — C9000 Multiple myeloma not having achieved remission: Secondary | ICD-10-CM | POA: Diagnosis not present

## 2021-11-08 DIAGNOSIS — Z48290 Encounter for aftercare following bone marrow transplant: Secondary | ICD-10-CM | POA: Diagnosis not present

## 2021-11-08 DIAGNOSIS — Z9484 Stem cells transplant status: Secondary | ICD-10-CM | POA: Diagnosis not present

## 2021-11-09 DIAGNOSIS — Z9484 Stem cells transplant status: Secondary | ICD-10-CM | POA: Diagnosis not present

## 2021-11-09 DIAGNOSIS — C8519 Unspecified B-cell lymphoma, extranodal and solid organ sites: Secondary | ICD-10-CM | POA: Diagnosis not present

## 2021-11-09 DIAGNOSIS — C9000 Multiple myeloma not having achieved remission: Secondary | ICD-10-CM | POA: Diagnosis not present

## 2021-11-10 DIAGNOSIS — Z7982 Long term (current) use of aspirin: Secondary | ICD-10-CM | POA: Diagnosis not present

## 2021-11-10 DIAGNOSIS — Z9484 Stem cells transplant status: Secondary | ICD-10-CM | POA: Diagnosis not present

## 2021-11-10 DIAGNOSIS — D61818 Other pancytopenia: Secondary | ICD-10-CM | POA: Diagnosis not present

## 2021-11-10 DIAGNOSIS — T451X5A Adverse effect of antineoplastic and immunosuppressive drugs, initial encounter: Secondary | ICD-10-CM | POA: Diagnosis not present

## 2021-11-10 DIAGNOSIS — D61810 Antineoplastic chemotherapy induced pancytopenia: Secondary | ICD-10-CM | POA: Diagnosis not present

## 2021-11-10 DIAGNOSIS — C8519 Unspecified B-cell lymphoma, extranodal and solid organ sites: Secondary | ICD-10-CM | POA: Diagnosis not present

## 2021-11-10 DIAGNOSIS — C9000 Multiple myeloma not having achieved remission: Secondary | ICD-10-CM | POA: Diagnosis not present

## 2021-11-11 DIAGNOSIS — Z7982 Long term (current) use of aspirin: Secondary | ICD-10-CM | POA: Diagnosis not present

## 2021-11-11 DIAGNOSIS — Z9484 Stem cells transplant status: Secondary | ICD-10-CM | POA: Diagnosis not present

## 2021-11-11 DIAGNOSIS — R197 Diarrhea, unspecified: Secondary | ICD-10-CM | POA: Diagnosis not present

## 2021-11-11 DIAGNOSIS — C8519 Unspecified B-cell lymphoma, extranodal and solid organ sites: Secondary | ICD-10-CM | POA: Diagnosis not present

## 2021-11-11 DIAGNOSIS — D61810 Antineoplastic chemotherapy induced pancytopenia: Secondary | ICD-10-CM | POA: Diagnosis not present

## 2021-11-11 DIAGNOSIS — Z48290 Encounter for aftercare following bone marrow transplant: Secondary | ICD-10-CM | POA: Diagnosis not present

## 2021-11-11 DIAGNOSIS — T451X5A Adverse effect of antineoplastic and immunosuppressive drugs, initial encounter: Secondary | ICD-10-CM | POA: Diagnosis not present

## 2021-11-11 DIAGNOSIS — C9000 Multiple myeloma not having achieved remission: Secondary | ICD-10-CM | POA: Diagnosis not present

## 2021-11-11 DIAGNOSIS — Z52011 Autologous donor, stem cells: Secondary | ICD-10-CM | POA: Diagnosis not present

## 2021-11-11 DIAGNOSIS — D61818 Other pancytopenia: Secondary | ICD-10-CM | POA: Diagnosis not present

## 2021-11-12 DIAGNOSIS — Z85828 Personal history of other malignant neoplasm of skin: Secondary | ICD-10-CM | POA: Diagnosis not present

## 2021-11-12 DIAGNOSIS — R197 Diarrhea, unspecified: Secondary | ICD-10-CM | POA: Diagnosis not present

## 2021-11-12 DIAGNOSIS — Z20822 Contact with and (suspected) exposure to covid-19: Secondary | ICD-10-CM | POA: Diagnosis not present

## 2021-11-12 DIAGNOSIS — Z9481 Bone marrow transplant status: Secondary | ICD-10-CM | POA: Diagnosis not present

## 2021-11-12 DIAGNOSIS — C8519 Unspecified B-cell lymphoma, extranodal and solid organ sites: Secondary | ICD-10-CM | POA: Diagnosis not present

## 2021-11-12 DIAGNOSIS — Z5111 Encounter for antineoplastic chemotherapy: Secondary | ICD-10-CM | POA: Diagnosis not present

## 2021-11-12 DIAGNOSIS — Z9484 Stem cells transplant status: Secondary | ICD-10-CM | POA: Diagnosis not present

## 2021-11-12 DIAGNOSIS — Z792 Long term (current) use of antibiotics: Secondary | ICD-10-CM | POA: Diagnosis not present

## 2021-11-12 DIAGNOSIS — R5081 Fever presenting with conditions classified elsewhere: Secondary | ICD-10-CM | POA: Diagnosis not present

## 2021-11-12 DIAGNOSIS — T865 Complications of stem cell transplant: Secondary | ICD-10-CM | POA: Diagnosis not present

## 2021-11-12 DIAGNOSIS — T451X5A Adverse effect of antineoplastic and immunosuppressive drugs, initial encounter: Secondary | ICD-10-CM | POA: Diagnosis not present

## 2021-11-12 DIAGNOSIS — D709 Neutropenia, unspecified: Secondary | ICD-10-CM | POA: Diagnosis not present

## 2021-11-12 DIAGNOSIS — R509 Fever, unspecified: Secondary | ICD-10-CM | POA: Diagnosis not present

## 2021-11-12 DIAGNOSIS — Q059 Spina bifida, unspecified: Secondary | ICD-10-CM | POA: Diagnosis not present

## 2021-11-12 DIAGNOSIS — D6959 Other secondary thrombocytopenia: Secondary | ICD-10-CM | POA: Diagnosis not present

## 2021-11-12 DIAGNOSIS — D696 Thrombocytopenia, unspecified: Secondary | ICD-10-CM | POA: Diagnosis not present

## 2021-11-12 DIAGNOSIS — D61810 Antineoplastic chemotherapy induced pancytopenia: Secondary | ICD-10-CM | POA: Diagnosis not present

## 2021-11-12 DIAGNOSIS — C9000 Multiple myeloma not having achieved remission: Secondary | ICD-10-CM | POA: Diagnosis not present

## 2021-11-12 DIAGNOSIS — C8510 Unspecified B-cell lymphoma, unspecified site: Secondary | ICD-10-CM | POA: Diagnosis not present

## 2021-11-12 DIAGNOSIS — Z7982 Long term (current) use of aspirin: Secondary | ICD-10-CM | POA: Diagnosis not present

## 2021-11-12 DIAGNOSIS — Z79899 Other long term (current) drug therapy: Secondary | ICD-10-CM | POA: Diagnosis not present

## 2021-11-14 DIAGNOSIS — Z5111 Encounter for antineoplastic chemotherapy: Secondary | ICD-10-CM | POA: Diagnosis not present

## 2021-11-14 DIAGNOSIS — C9000 Multiple myeloma not having achieved remission: Secondary | ICD-10-CM | POA: Diagnosis not present

## 2021-11-14 DIAGNOSIS — C8519 Unspecified B-cell lymphoma, extranodal and solid organ sites: Secondary | ICD-10-CM | POA: Diagnosis not present

## 2021-11-14 DIAGNOSIS — Z9484 Stem cells transplant status: Secondary | ICD-10-CM | POA: Diagnosis not present

## 2021-11-15 DIAGNOSIS — Z9484 Stem cells transplant status: Secondary | ICD-10-CM | POA: Diagnosis not present

## 2021-11-15 DIAGNOSIS — D61810 Antineoplastic chemotherapy induced pancytopenia: Secondary | ICD-10-CM | POA: Diagnosis not present

## 2021-11-15 DIAGNOSIS — R5081 Fever presenting with conditions classified elsewhere: Secondary | ICD-10-CM | POA: Diagnosis not present

## 2021-11-15 DIAGNOSIS — R197 Diarrhea, unspecified: Secondary | ICD-10-CM | POA: Diagnosis not present

## 2021-11-15 DIAGNOSIS — D709 Neutropenia, unspecified: Secondary | ICD-10-CM | POA: Diagnosis not present

## 2021-11-15 DIAGNOSIS — C9000 Multiple myeloma not having achieved remission: Secondary | ICD-10-CM | POA: Diagnosis not present

## 2021-11-15 DIAGNOSIS — T451X5A Adverse effect of antineoplastic and immunosuppressive drugs, initial encounter: Secondary | ICD-10-CM | POA: Diagnosis not present

## 2021-11-15 DIAGNOSIS — R509 Fever, unspecified: Secondary | ICD-10-CM | POA: Diagnosis not present

## 2021-11-16 DIAGNOSIS — C9000 Multiple myeloma not having achieved remission: Secondary | ICD-10-CM | POA: Diagnosis not present

## 2021-11-16 DIAGNOSIS — Z9484 Stem cells transplant status: Secondary | ICD-10-CM | POA: Diagnosis not present

## 2021-11-16 DIAGNOSIS — D709 Neutropenia, unspecified: Secondary | ICD-10-CM | POA: Diagnosis not present

## 2021-11-16 DIAGNOSIS — D61810 Antineoplastic chemotherapy induced pancytopenia: Secondary | ICD-10-CM | POA: Diagnosis not present

## 2021-11-16 DIAGNOSIS — T451X5A Adverse effect of antineoplastic and immunosuppressive drugs, initial encounter: Secondary | ICD-10-CM | POA: Diagnosis not present

## 2021-11-16 DIAGNOSIS — R197 Diarrhea, unspecified: Secondary | ICD-10-CM | POA: Diagnosis not present

## 2021-11-16 DIAGNOSIS — R5081 Fever presenting with conditions classified elsewhere: Secondary | ICD-10-CM | POA: Diagnosis not present

## 2021-11-17 DIAGNOSIS — C9000 Multiple myeloma not having achieved remission: Secondary | ICD-10-CM | POA: Diagnosis not present

## 2021-11-17 DIAGNOSIS — T451X5A Adverse effect of antineoplastic and immunosuppressive drugs, initial encounter: Secondary | ICD-10-CM | POA: Diagnosis not present

## 2021-11-17 DIAGNOSIS — D61810 Antineoplastic chemotherapy induced pancytopenia: Secondary | ICD-10-CM | POA: Diagnosis not present

## 2021-11-17 DIAGNOSIS — D709 Neutropenia, unspecified: Secondary | ICD-10-CM | POA: Diagnosis not present

## 2021-11-17 DIAGNOSIS — Z9484 Stem cells transplant status: Secondary | ICD-10-CM | POA: Diagnosis not present

## 2021-11-17 DIAGNOSIS — R197 Diarrhea, unspecified: Secondary | ICD-10-CM | POA: Diagnosis not present

## 2021-11-18 DIAGNOSIS — C9000 Multiple myeloma not having achieved remission: Secondary | ICD-10-CM | POA: Diagnosis not present

## 2021-11-18 DIAGNOSIS — D696 Thrombocytopenia, unspecified: Secondary | ICD-10-CM | POA: Diagnosis not present

## 2021-11-18 DIAGNOSIS — Z9484 Stem cells transplant status: Secondary | ICD-10-CM | POA: Diagnosis not present

## 2021-11-18 DIAGNOSIS — D61810 Antineoplastic chemotherapy induced pancytopenia: Secondary | ICD-10-CM | POA: Diagnosis not present

## 2021-11-18 DIAGNOSIS — T451X5A Adverse effect of antineoplastic and immunosuppressive drugs, initial encounter: Secondary | ICD-10-CM | POA: Diagnosis not present

## 2021-11-19 DIAGNOSIS — D61810 Antineoplastic chemotherapy induced pancytopenia: Secondary | ICD-10-CM | POA: Diagnosis not present

## 2021-11-19 DIAGNOSIS — Z9484 Stem cells transplant status: Secondary | ICD-10-CM | POA: Diagnosis not present

## 2021-11-19 DIAGNOSIS — R5081 Fever presenting with conditions classified elsewhere: Secondary | ICD-10-CM | POA: Diagnosis not present

## 2021-11-19 DIAGNOSIS — D709 Neutropenia, unspecified: Secondary | ICD-10-CM | POA: Diagnosis not present

## 2021-11-19 DIAGNOSIS — C9000 Multiple myeloma not having achieved remission: Secondary | ICD-10-CM | POA: Diagnosis not present

## 2021-11-20 DIAGNOSIS — T451X5D Adverse effect of antineoplastic and immunosuppressive drugs, subsequent encounter: Secondary | ICD-10-CM | POA: Diagnosis not present

## 2021-11-20 DIAGNOSIS — Z9484 Stem cells transplant status: Secondary | ICD-10-CM | POA: Diagnosis not present

## 2021-11-20 DIAGNOSIS — C9000 Multiple myeloma not having achieved remission: Secondary | ICD-10-CM | POA: Diagnosis not present

## 2021-11-20 DIAGNOSIS — C8519 Unspecified B-cell lymphoma, extranodal and solid organ sites: Secondary | ICD-10-CM | POA: Diagnosis not present

## 2021-11-20 DIAGNOSIS — D61810 Antineoplastic chemotherapy induced pancytopenia: Secondary | ICD-10-CM | POA: Diagnosis not present

## 2021-11-21 DIAGNOSIS — R197 Diarrhea, unspecified: Secondary | ICD-10-CM | POA: Diagnosis not present

## 2021-11-21 DIAGNOSIS — C9000 Multiple myeloma not having achieved remission: Secondary | ICD-10-CM | POA: Diagnosis not present

## 2021-11-21 DIAGNOSIS — Z5111 Encounter for antineoplastic chemotherapy: Secondary | ICD-10-CM | POA: Diagnosis not present

## 2021-11-21 DIAGNOSIS — Z9484 Stem cells transplant status: Secondary | ICD-10-CM | POA: Diagnosis not present

## 2021-11-21 DIAGNOSIS — C8519 Unspecified B-cell lymphoma, extranodal and solid organ sites: Secondary | ICD-10-CM | POA: Diagnosis not present

## 2021-11-26 DIAGNOSIS — C8580 Other specified types of non-Hodgkin lymphoma, unspecified site: Secondary | ICD-10-CM | POA: Diagnosis not present

## 2021-11-26 DIAGNOSIS — C8519 Unspecified B-cell lymphoma, extranodal and solid organ sites: Secondary | ICD-10-CM | POA: Diagnosis not present

## 2021-11-26 DIAGNOSIS — C9000 Multiple myeloma not having achieved remission: Secondary | ICD-10-CM | POA: Diagnosis not present

## 2021-11-26 DIAGNOSIS — C8587 Other specified types of non-Hodgkin lymphoma, spleen: Secondary | ICD-10-CM | POA: Diagnosis not present

## 2021-11-26 DIAGNOSIS — Z9484 Stem cells transplant status: Secondary | ICD-10-CM | POA: Diagnosis not present

## 2021-11-27 DIAGNOSIS — Z9484 Stem cells transplant status: Secondary | ICD-10-CM | POA: Diagnosis not present

## 2021-11-27 DIAGNOSIS — C9000 Multiple myeloma not having achieved remission: Secondary | ICD-10-CM | POA: Diagnosis not present

## 2021-11-27 DIAGNOSIS — I82612 Acute embolism and thrombosis of superficial veins of left upper extremity: Secondary | ICD-10-CM | POA: Diagnosis not present

## 2021-11-27 DIAGNOSIS — I82622 Acute embolism and thrombosis of deep veins of left upper extremity: Secondary | ICD-10-CM | POA: Diagnosis not present

## 2021-12-05 DIAGNOSIS — Z86718 Personal history of other venous thrombosis and embolism: Secondary | ICD-10-CM | POA: Diagnosis not present

## 2021-12-05 DIAGNOSIS — C9000 Multiple myeloma not having achieved remission: Secondary | ICD-10-CM | POA: Diagnosis not present

## 2021-12-05 DIAGNOSIS — Z792 Long term (current) use of antibiotics: Secondary | ICD-10-CM | POA: Diagnosis not present

## 2021-12-05 DIAGNOSIS — Z9481 Bone marrow transplant status: Secondary | ICD-10-CM | POA: Diagnosis not present

## 2021-12-05 DIAGNOSIS — Z9484 Stem cells transplant status: Secondary | ICD-10-CM | POA: Diagnosis not present

## 2021-12-05 DIAGNOSIS — C8580 Other specified types of non-Hodgkin lymphoma, unspecified site: Secondary | ICD-10-CM | POA: Diagnosis not present

## 2021-12-05 DIAGNOSIS — Q059 Spina bifida, unspecified: Secondary | ICD-10-CM | POA: Diagnosis not present

## 2021-12-05 DIAGNOSIS — R197 Diarrhea, unspecified: Secondary | ICD-10-CM | POA: Diagnosis not present

## 2021-12-05 DIAGNOSIS — Z79899 Other long term (current) drug therapy: Secondary | ICD-10-CM | POA: Diagnosis not present

## 2021-12-05 DIAGNOSIS — Z7901 Long term (current) use of anticoagulants: Secondary | ICD-10-CM | POA: Diagnosis not present

## 2022-01-02 DIAGNOSIS — C8580 Other specified types of non-Hodgkin lymphoma, unspecified site: Secondary | ICD-10-CM | POA: Diagnosis not present

## 2022-01-02 DIAGNOSIS — Z79899 Other long term (current) drug therapy: Secondary | ICD-10-CM | POA: Diagnosis not present

## 2022-01-02 DIAGNOSIS — C8519 Unspecified B-cell lymphoma, extranodal and solid organ sites: Secondary | ICD-10-CM | POA: Diagnosis not present

## 2022-01-02 DIAGNOSIS — Z7901 Long term (current) use of anticoagulants: Secondary | ICD-10-CM | POA: Diagnosis not present

## 2022-01-02 DIAGNOSIS — C9000 Multiple myeloma not having achieved remission: Secondary | ICD-10-CM | POA: Diagnosis not present

## 2022-01-02 DIAGNOSIS — Z9484 Stem cells transplant status: Secondary | ICD-10-CM | POA: Diagnosis not present

## 2022-01-02 DIAGNOSIS — Z86718 Personal history of other venous thrombosis and embolism: Secondary | ICD-10-CM | POA: Diagnosis not present

## 2022-01-02 DIAGNOSIS — Z9221 Personal history of antineoplastic chemotherapy: Secondary | ICD-10-CM | POA: Diagnosis not present

## 2022-02-18 DIAGNOSIS — C9000 Multiple myeloma not having achieved remission: Secondary | ICD-10-CM | POA: Diagnosis not present

## 2022-02-18 DIAGNOSIS — Z9484 Stem cells transplant status: Secondary | ICD-10-CM | POA: Diagnosis not present

## 2022-02-18 DIAGNOSIS — C8580 Other specified types of non-Hodgkin lymphoma, unspecified site: Secondary | ICD-10-CM | POA: Diagnosis not present

## 2022-02-18 DIAGNOSIS — R7989 Other specified abnormal findings of blood chemistry: Secondary | ICD-10-CM | POA: Diagnosis not present

## 2022-02-25 DIAGNOSIS — Z9484 Stem cells transplant status: Secondary | ICD-10-CM | POA: Diagnosis not present

## 2022-02-25 DIAGNOSIS — I82409 Acute embolism and thrombosis of unspecified deep veins of unspecified lower extremity: Secondary | ICD-10-CM | POA: Diagnosis not present

## 2022-02-25 DIAGNOSIS — C8510 Unspecified B-cell lymphoma, unspecified site: Secondary | ICD-10-CM | POA: Diagnosis not present

## 2022-02-25 DIAGNOSIS — C9001 Multiple myeloma in remission: Secondary | ICD-10-CM | POA: Diagnosis not present

## 2022-02-25 DIAGNOSIS — Z9221 Personal history of antineoplastic chemotherapy: Secondary | ICD-10-CM | POA: Diagnosis not present

## 2022-02-25 DIAGNOSIS — Z7984 Long term (current) use of oral hypoglycemic drugs: Secondary | ICD-10-CM | POA: Diagnosis not present

## 2022-02-25 DIAGNOSIS — Z792 Long term (current) use of antibiotics: Secondary | ICD-10-CM | POA: Diagnosis not present

## 2022-02-25 DIAGNOSIS — Z79899 Other long term (current) drug therapy: Secondary | ICD-10-CM | POA: Diagnosis not present

## 2022-02-25 DIAGNOSIS — Z7983 Long term (current) use of bisphosphonates: Secondary | ICD-10-CM | POA: Diagnosis not present

## 2022-02-25 DIAGNOSIS — C8519 Unspecified B-cell lymphoma, extranodal and solid organ sites: Secondary | ICD-10-CM | POA: Diagnosis not present

## 2022-02-25 DIAGNOSIS — I82623 Acute embolism and thrombosis of deep veins of upper extremity, bilateral: Secondary | ICD-10-CM | POA: Diagnosis not present

## 2022-02-25 DIAGNOSIS — Z7901 Long term (current) use of anticoagulants: Secondary | ICD-10-CM | POA: Diagnosis not present

## 2022-02-25 DIAGNOSIS — C9000 Multiple myeloma not having achieved remission: Secondary | ICD-10-CM | POA: Diagnosis not present

## 2022-03-25 DIAGNOSIS — Z7901 Long term (current) use of anticoagulants: Secondary | ICD-10-CM | POA: Diagnosis not present

## 2022-03-25 DIAGNOSIS — Z9481 Bone marrow transplant status: Secondary | ICD-10-CM | POA: Diagnosis not present

## 2022-03-25 DIAGNOSIS — Z86718 Personal history of other venous thrombosis and embolism: Secondary | ICD-10-CM | POA: Diagnosis not present

## 2022-03-25 DIAGNOSIS — C9000 Multiple myeloma not having achieved remission: Secondary | ICD-10-CM | POA: Diagnosis not present

## 2022-03-25 DIAGNOSIS — C8580 Other specified types of non-Hodgkin lymphoma, unspecified site: Secondary | ICD-10-CM | POA: Diagnosis not present

## 2022-03-25 DIAGNOSIS — Z7983 Long term (current) use of bisphosphonates: Secondary | ICD-10-CM | POA: Diagnosis not present

## 2022-03-25 DIAGNOSIS — Z923 Personal history of irradiation: Secondary | ICD-10-CM | POA: Diagnosis not present

## 2022-03-25 DIAGNOSIS — Z9484 Stem cells transplant status: Secondary | ICD-10-CM | POA: Diagnosis not present

## 2022-03-25 DIAGNOSIS — Z792 Long term (current) use of antibiotics: Secondary | ICD-10-CM | POA: Diagnosis not present

## 2022-03-25 DIAGNOSIS — I825Z9 Chronic embolism and thrombosis of unspecified deep veins of unspecified distal lower extremity: Secondary | ICD-10-CM | POA: Diagnosis not present

## 2022-03-25 DIAGNOSIS — D61818 Other pancytopenia: Secondary | ICD-10-CM | POA: Diagnosis not present

## 2022-03-25 DIAGNOSIS — R768 Other specified abnormal immunological findings in serum: Secondary | ICD-10-CM | POA: Diagnosis not present

## 2022-03-25 DIAGNOSIS — C8519 Unspecified B-cell lymphoma, extranodal and solid organ sites: Secondary | ICD-10-CM | POA: Diagnosis not present

## 2022-03-25 DIAGNOSIS — I82622 Acute embolism and thrombosis of deep veins of left upper extremity: Secondary | ICD-10-CM | POA: Diagnosis not present

## 2022-03-25 DIAGNOSIS — Z7961 Long term (current) use of immunomodulator: Secondary | ICD-10-CM | POA: Diagnosis not present

## 2022-03-25 DIAGNOSIS — Z23 Encounter for immunization: Secondary | ICD-10-CM | POA: Diagnosis not present

## 2022-03-25 DIAGNOSIS — Q059 Spina bifida, unspecified: Secondary | ICD-10-CM | POA: Diagnosis not present

## 2022-03-25 DIAGNOSIS — Z9221 Personal history of antineoplastic chemotherapy: Secondary | ICD-10-CM | POA: Diagnosis not present

## 2022-03-25 DIAGNOSIS — Z79899 Other long term (current) drug therapy: Secondary | ICD-10-CM | POA: Diagnosis not present

## 2022-04-17 DIAGNOSIS — S22001D Stable burst fracture of unspecified thoracic vertebra, subsequent encounter for fracture with routine healing: Secondary | ICD-10-CM | POA: Diagnosis not present

## 2022-05-06 DIAGNOSIS — Z86718 Personal history of other venous thrombosis and embolism: Secondary | ICD-10-CM | POA: Diagnosis not present

## 2022-05-06 DIAGNOSIS — Z7961 Long term (current) use of immunomodulator: Secondary | ICD-10-CM | POA: Diagnosis not present

## 2022-05-06 DIAGNOSIS — D61818 Other pancytopenia: Secondary | ICD-10-CM | POA: Diagnosis not present

## 2022-05-06 DIAGNOSIS — C8519 Unspecified B-cell lymphoma, extranodal and solid organ sites: Secondary | ICD-10-CM | POA: Diagnosis not present

## 2022-05-06 DIAGNOSIS — Z808 Family history of malignant neoplasm of other organs or systems: Secondary | ICD-10-CM | POA: Diagnosis not present

## 2022-05-06 DIAGNOSIS — C9000 Multiple myeloma not having achieved remission: Secondary | ICD-10-CM | POA: Diagnosis not present

## 2022-05-06 DIAGNOSIS — D61810 Antineoplastic chemotherapy induced pancytopenia: Secondary | ICD-10-CM | POA: Diagnosis not present

## 2022-05-06 DIAGNOSIS — Z85828 Personal history of other malignant neoplasm of skin: Secondary | ICD-10-CM | POA: Diagnosis not present

## 2022-05-06 DIAGNOSIS — Z9484 Stem cells transplant status: Secondary | ICD-10-CM | POA: Diagnosis not present

## 2022-05-06 DIAGNOSIS — C8580 Other specified types of non-Hodgkin lymphoma, unspecified site: Secondary | ICD-10-CM | POA: Diagnosis not present

## 2022-05-06 DIAGNOSIS — Z7983 Long term (current) use of bisphosphonates: Secondary | ICD-10-CM | POA: Diagnosis not present

## 2022-05-06 DIAGNOSIS — Q059 Spina bifida, unspecified: Secondary | ICD-10-CM | POA: Diagnosis not present

## 2022-05-06 DIAGNOSIS — Z923 Personal history of irradiation: Secondary | ICD-10-CM | POA: Diagnosis not present

## 2022-05-06 DIAGNOSIS — Z79899 Other long term (current) drug therapy: Secondary | ICD-10-CM | POA: Diagnosis not present

## 2022-05-06 DIAGNOSIS — Z7901 Long term (current) use of anticoagulants: Secondary | ICD-10-CM | POA: Diagnosis not present

## 2022-05-06 DIAGNOSIS — Z792 Long term (current) use of antibiotics: Secondary | ICD-10-CM | POA: Diagnosis not present

## 2022-05-06 DIAGNOSIS — Z23 Encounter for immunization: Secondary | ICD-10-CM | POA: Diagnosis not present

## 2022-05-06 DIAGNOSIS — Z79624 Long term (current) use of inhibitors of nucleotide synthesis: Secondary | ICD-10-CM | POA: Diagnosis not present

## 2022-06-03 DIAGNOSIS — C9000 Multiple myeloma not having achieved remission: Secondary | ICD-10-CM | POA: Diagnosis not present

## 2022-06-03 DIAGNOSIS — I82629 Acute embolism and thrombosis of deep veins of unspecified upper extremity: Secondary | ICD-10-CM | POA: Diagnosis not present

## 2022-06-03 DIAGNOSIS — Z7902 Long term (current) use of antithrombotics/antiplatelets: Secondary | ICD-10-CM | POA: Diagnosis not present

## 2022-06-03 DIAGNOSIS — Z792 Long term (current) use of antibiotics: Secondary | ICD-10-CM | POA: Diagnosis not present

## 2022-06-03 DIAGNOSIS — R7989 Other specified abnormal findings of blood chemistry: Secondary | ICD-10-CM | POA: Diagnosis not present

## 2022-06-03 DIAGNOSIS — Z79624 Long term (current) use of inhibitors of nucleotide synthesis: Secondary | ICD-10-CM | POA: Diagnosis not present

## 2022-06-03 DIAGNOSIS — Z7961 Long term (current) use of immunomodulator: Secondary | ICD-10-CM | POA: Diagnosis not present

## 2022-06-03 DIAGNOSIS — D61818 Other pancytopenia: Secondary | ICD-10-CM | POA: Diagnosis not present

## 2022-06-03 DIAGNOSIS — C9001 Multiple myeloma in remission: Secondary | ICD-10-CM | POA: Diagnosis not present

## 2022-06-03 DIAGNOSIS — C8519 Unspecified B-cell lymphoma, extranodal and solid organ sites: Secondary | ICD-10-CM | POA: Diagnosis not present

## 2022-06-03 DIAGNOSIS — Z9484 Stem cells transplant status: Secondary | ICD-10-CM | POA: Diagnosis not present

## 2022-06-03 DIAGNOSIS — Z7983 Long term (current) use of bisphosphonates: Secondary | ICD-10-CM | POA: Diagnosis not present

## 2022-06-03 DIAGNOSIS — C8580 Other specified types of non-Hodgkin lymphoma, unspecified site: Secondary | ICD-10-CM | POA: Diagnosis not present

## 2022-07-08 DIAGNOSIS — Z792 Long term (current) use of antibiotics: Secondary | ICD-10-CM | POA: Diagnosis not present

## 2022-07-08 DIAGNOSIS — Z23 Encounter for immunization: Secondary | ICD-10-CM | POA: Diagnosis not present

## 2022-07-08 DIAGNOSIS — Z7902 Long term (current) use of antithrombotics/antiplatelets: Secondary | ICD-10-CM | POA: Diagnosis not present

## 2022-07-08 DIAGNOSIS — Z79624 Long term (current) use of inhibitors of nucleotide synthesis: Secondary | ICD-10-CM | POA: Diagnosis not present

## 2022-07-08 DIAGNOSIS — C8580 Other specified types of non-Hodgkin lymphoma, unspecified site: Secondary | ICD-10-CM | POA: Diagnosis not present

## 2022-07-08 DIAGNOSIS — Z7983 Long term (current) use of bisphosphonates: Secondary | ICD-10-CM | POA: Diagnosis not present

## 2022-07-08 DIAGNOSIS — Z86718 Personal history of other venous thrombosis and embolism: Secondary | ICD-10-CM | POA: Diagnosis not present

## 2022-07-08 DIAGNOSIS — Z7961 Long term (current) use of immunomodulator: Secondary | ICD-10-CM | POA: Diagnosis not present

## 2022-07-08 DIAGNOSIS — C9000 Multiple myeloma not having achieved remission: Secondary | ICD-10-CM | POA: Diagnosis not present

## 2022-07-08 DIAGNOSIS — Z736 Limitation of activities due to disability: Secondary | ICD-10-CM | POA: Diagnosis not present

## 2022-07-08 DIAGNOSIS — D61818 Other pancytopenia: Secondary | ICD-10-CM | POA: Diagnosis not present

## 2022-07-08 DIAGNOSIS — Z9484 Stem cells transplant status: Secondary | ICD-10-CM | POA: Diagnosis not present

## 2022-07-08 DIAGNOSIS — C8519 Unspecified B-cell lymphoma, extranodal and solid organ sites: Secondary | ICD-10-CM | POA: Diagnosis not present

## 2022-07-08 DIAGNOSIS — Q059 Spina bifida, unspecified: Secondary | ICD-10-CM | POA: Diagnosis not present

## 2022-07-08 DIAGNOSIS — R110 Nausea: Secondary | ICD-10-CM | POA: Diagnosis not present

## 2022-08-05 DIAGNOSIS — K429 Umbilical hernia without obstruction or gangrene: Secondary | ICD-10-CM | POA: Diagnosis not present

## 2022-08-05 DIAGNOSIS — R7989 Other specified abnormal findings of blood chemistry: Secondary | ICD-10-CM | POA: Diagnosis not present

## 2022-08-05 DIAGNOSIS — C8590 Non-Hodgkin lymphoma, unspecified, unspecified site: Secondary | ICD-10-CM | POA: Diagnosis not present

## 2022-08-05 DIAGNOSIS — R21 Rash and other nonspecific skin eruption: Secondary | ICD-10-CM | POA: Diagnosis not present

## 2022-08-05 DIAGNOSIS — Z7901 Long term (current) use of anticoagulants: Secondary | ICD-10-CM | POA: Diagnosis not present

## 2022-08-05 DIAGNOSIS — Z9484 Stem cells transplant status: Secondary | ICD-10-CM | POA: Diagnosis not present

## 2022-08-05 DIAGNOSIS — C9000 Multiple myeloma not having achieved remission: Secondary | ICD-10-CM | POA: Diagnosis not present

## 2022-08-05 DIAGNOSIS — M549 Dorsalgia, unspecified: Secondary | ICD-10-CM | POA: Diagnosis not present

## 2022-08-05 DIAGNOSIS — C8580 Other specified types of non-Hodgkin lymphoma, unspecified site: Secondary | ICD-10-CM | POA: Diagnosis not present

## 2022-08-05 DIAGNOSIS — D696 Thrombocytopenia, unspecified: Secondary | ICD-10-CM | POA: Diagnosis not present

## 2022-08-05 DIAGNOSIS — Z86718 Personal history of other venous thrombosis and embolism: Secondary | ICD-10-CM | POA: Diagnosis not present

## 2022-08-05 DIAGNOSIS — D61818 Other pancytopenia: Secondary | ICD-10-CM | POA: Diagnosis not present

## 2022-08-05 DIAGNOSIS — D649 Anemia, unspecified: Secondary | ICD-10-CM | POA: Diagnosis not present

## 2022-08-05 DIAGNOSIS — Z79624 Long term (current) use of inhibitors of nucleotide synthesis: Secondary | ICD-10-CM | POA: Diagnosis not present

## 2022-08-05 DIAGNOSIS — D61810 Antineoplastic chemotherapy induced pancytopenia: Secondary | ICD-10-CM | POA: Diagnosis not present

## 2022-08-05 DIAGNOSIS — Z7983 Long term (current) use of bisphosphonates: Secondary | ICD-10-CM | POA: Diagnosis not present

## 2022-08-05 DIAGNOSIS — D759 Disease of blood and blood-forming organs, unspecified: Secondary | ICD-10-CM | POA: Diagnosis not present

## 2022-08-05 DIAGNOSIS — D709 Neutropenia, unspecified: Secondary | ICD-10-CM | POA: Diagnosis not present

## 2022-08-05 DIAGNOSIS — Z923 Personal history of irradiation: Secondary | ICD-10-CM | POA: Diagnosis not present

## 2022-08-05 DIAGNOSIS — C9001 Multiple myeloma in remission: Secondary | ICD-10-CM | POA: Diagnosis not present

## 2022-08-05 DIAGNOSIS — C8510 Unspecified B-cell lymphoma, unspecified site: Secondary | ICD-10-CM | POA: Diagnosis not present

## 2022-08-19 DIAGNOSIS — Z792 Long term (current) use of antibiotics: Secondary | ICD-10-CM | POA: Diagnosis not present

## 2022-08-19 DIAGNOSIS — Z9484 Stem cells transplant status: Secondary | ICD-10-CM | POA: Diagnosis not present

## 2022-08-19 DIAGNOSIS — C9001 Multiple myeloma in remission: Secondary | ICD-10-CM | POA: Diagnosis not present

## 2022-08-19 DIAGNOSIS — C9000 Multiple myeloma not having achieved remission: Secondary | ICD-10-CM | POA: Diagnosis not present

## 2022-08-19 DIAGNOSIS — D630 Anemia in neoplastic disease: Secondary | ICD-10-CM | POA: Diagnosis not present

## 2022-08-19 DIAGNOSIS — C8519 Unspecified B-cell lymphoma, extranodal and solid organ sites: Secondary | ICD-10-CM | POA: Diagnosis not present

## 2022-08-19 DIAGNOSIS — Z85828 Personal history of other malignant neoplasm of skin: Secondary | ICD-10-CM | POA: Diagnosis not present

## 2022-08-19 DIAGNOSIS — Z79624 Long term (current) use of inhibitors of nucleotide synthesis: Secondary | ICD-10-CM | POA: Diagnosis not present

## 2022-08-19 DIAGNOSIS — R21 Rash and other nonspecific skin eruption: Secondary | ICD-10-CM | POA: Diagnosis not present

## 2022-08-19 DIAGNOSIS — C8510 Unspecified B-cell lymphoma, unspecified site: Secondary | ICD-10-CM | POA: Diagnosis not present

## 2022-08-19 DIAGNOSIS — Z7901 Long term (current) use of anticoagulants: Secondary | ICD-10-CM | POA: Diagnosis not present

## 2022-08-19 DIAGNOSIS — Z23 Encounter for immunization: Secondary | ICD-10-CM | POA: Diagnosis not present

## 2022-08-19 DIAGNOSIS — K429 Umbilical hernia without obstruction or gangrene: Secondary | ICD-10-CM | POA: Diagnosis not present

## 2022-08-19 DIAGNOSIS — Z7983 Long term (current) use of bisphosphonates: Secondary | ICD-10-CM | POA: Diagnosis not present

## 2022-08-19 DIAGNOSIS — Z52011 Autologous donor, stem cells: Secondary | ICD-10-CM | POA: Diagnosis not present

## 2022-08-19 DIAGNOSIS — D6959 Other secondary thrombocytopenia: Secondary | ICD-10-CM | POA: Diagnosis not present

## 2022-08-19 DIAGNOSIS — D72819 Decreased white blood cell count, unspecified: Secondary | ICD-10-CM | POA: Diagnosis not present

## 2022-08-19 DIAGNOSIS — Z7961 Long term (current) use of immunomodulator: Secondary | ICD-10-CM | POA: Diagnosis not present

## 2022-08-19 DIAGNOSIS — Z86718 Personal history of other venous thrombosis and embolism: Secondary | ICD-10-CM | POA: Diagnosis not present

## 2022-08-19 DIAGNOSIS — D61818 Other pancytopenia: Secondary | ICD-10-CM | POA: Diagnosis not present

## 2022-09-19 IMAGING — CT CT ANGIO CHEST
2 of 6 series · 18 of 36 positions shown · IV contrast (Omnipaque or Isovue)
Comparison: CT images from a prior nuclear medicine PET/CT, dated

CLINICAL DATA: Chest pain.

EXAM:
CT ANGIOGRAPHY CHEST WITH CONTRAST
TECHNIQUE: Multidetector CT imaging of the chest was performed using the
standard protocol during bolus administration of intravenous
contrast. Multiplanar CT image reconstructions and MIPs were
obtained to evaluate the vascular anatomy.
CONTRAST:  100mL OMNIPAQUE IOHEXOL 350 MG/ML SOLN

[Series 5: pe axial thins · axial · 0.74mm/px · z∈[+1143,+1449]mm · 17 of 340 slices shown]
[im 17/340  lung]
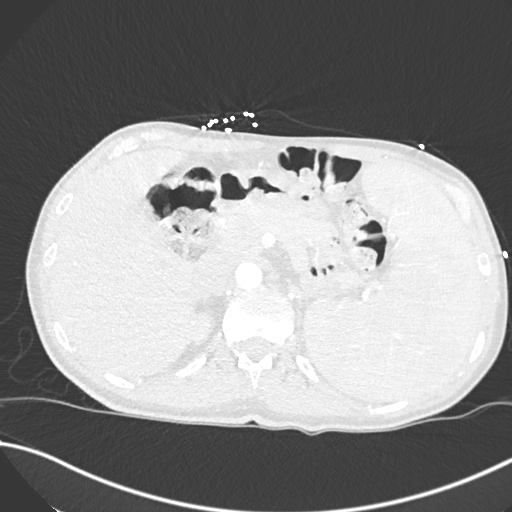
[im 34/340  mediastinal]
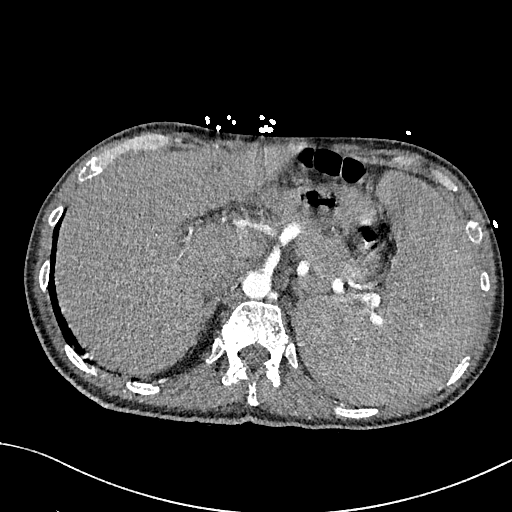
[im 51/340  lung]
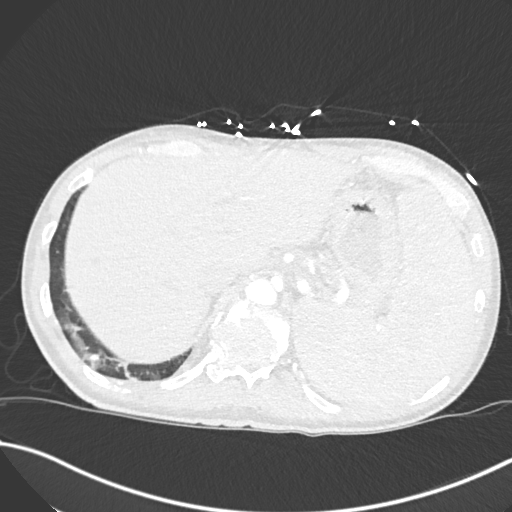
[im 68/340  mediastinal]
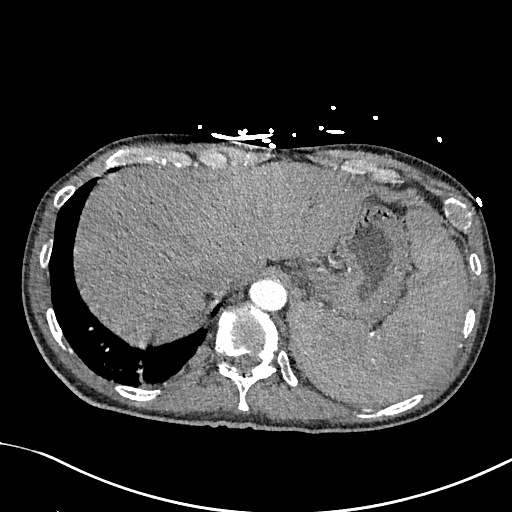
[im 102/340  lung]
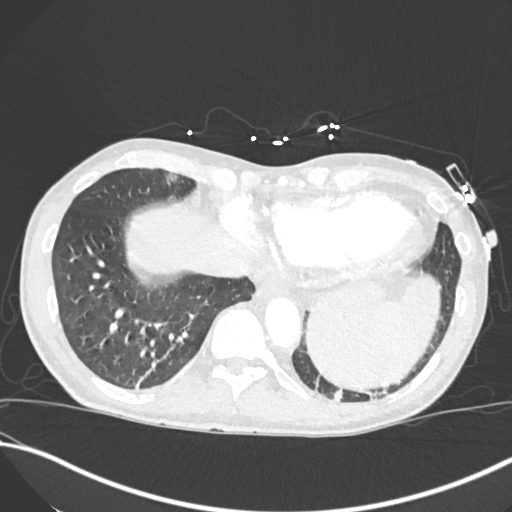
[im 119/340  mediastinal]
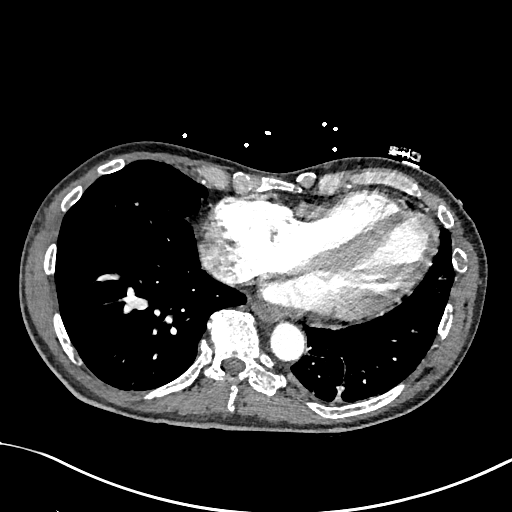
[im 136/340  lung]
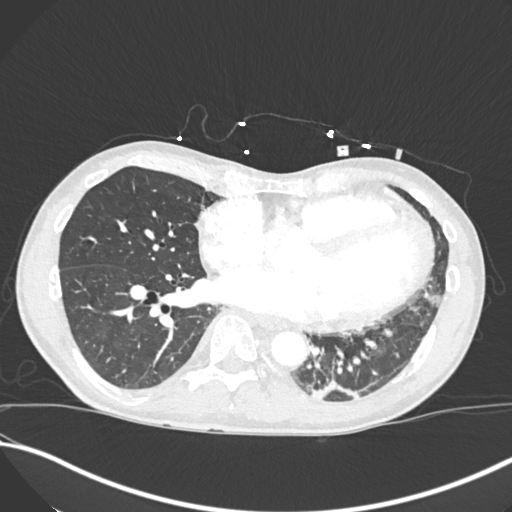
[im 153/340  mediastinal]
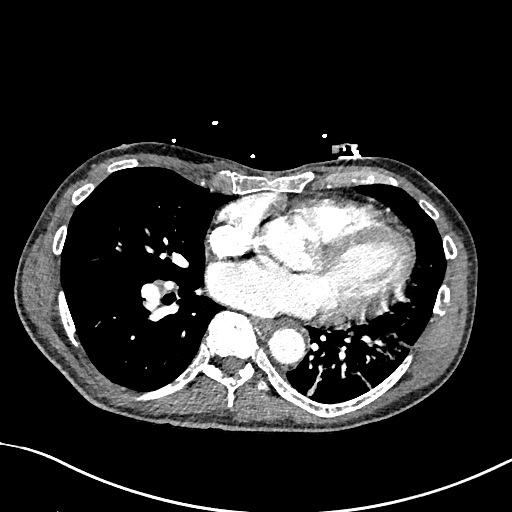
[im 170/340  lung]
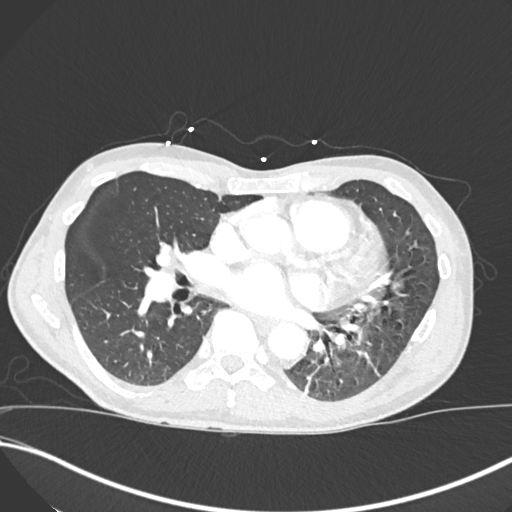
[im 187/340  mediastinal]
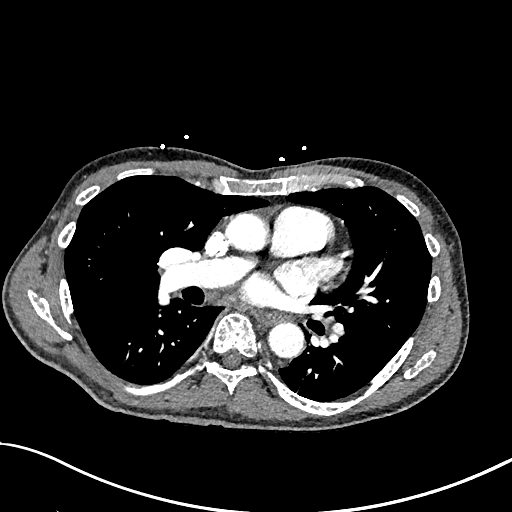
[im 204/340  lung]
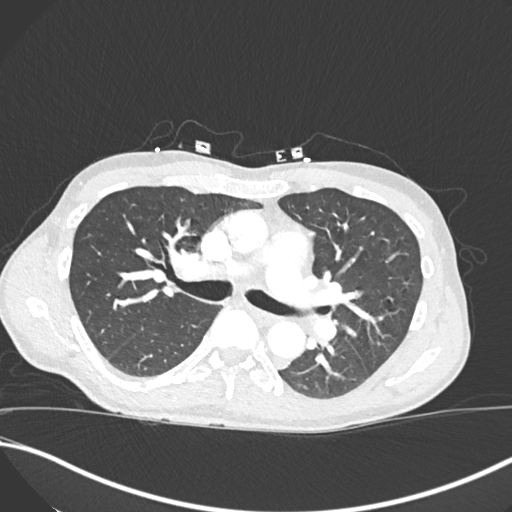
[im 221/340  mediastinal]
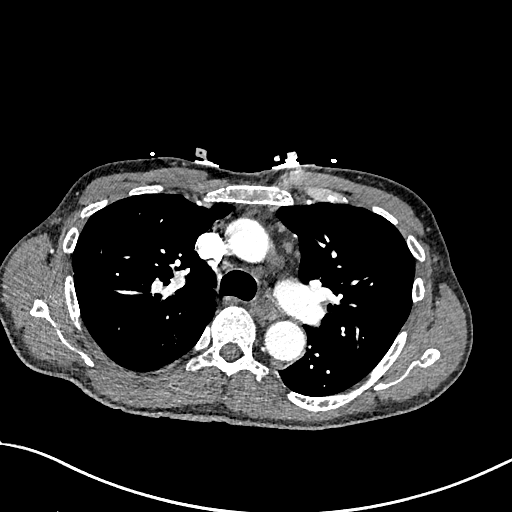
[im 238/340  lung]
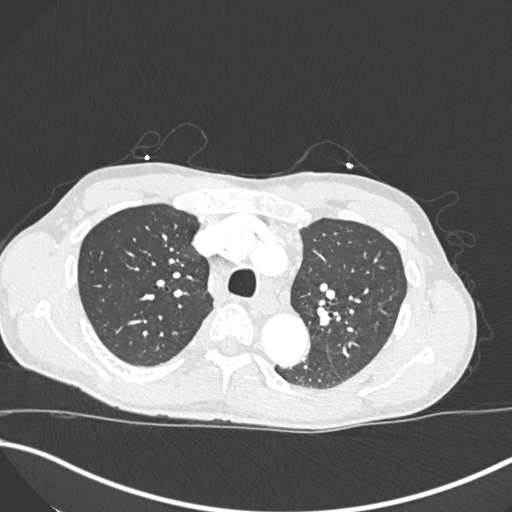
[im 272/340  mediastinal]
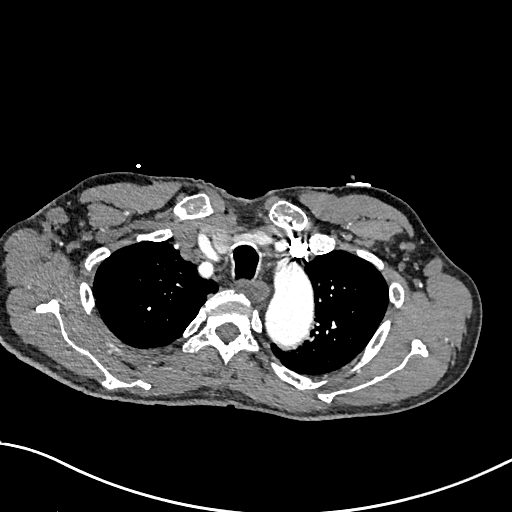
[im 289/340  lung]
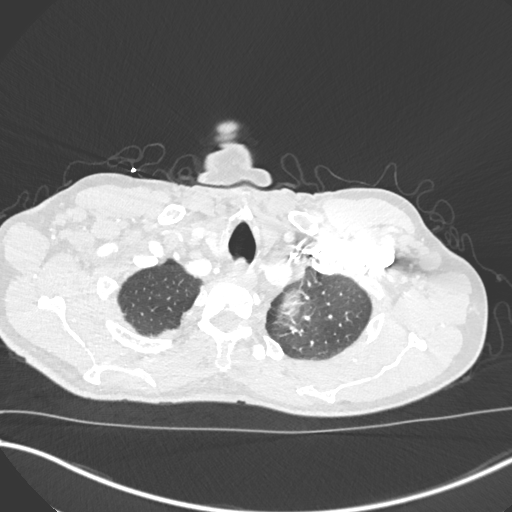
[im 306/340  mediastinal]
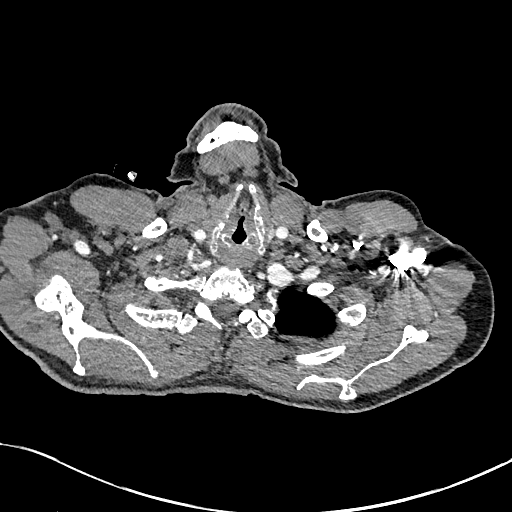
[im 323/340  lung]
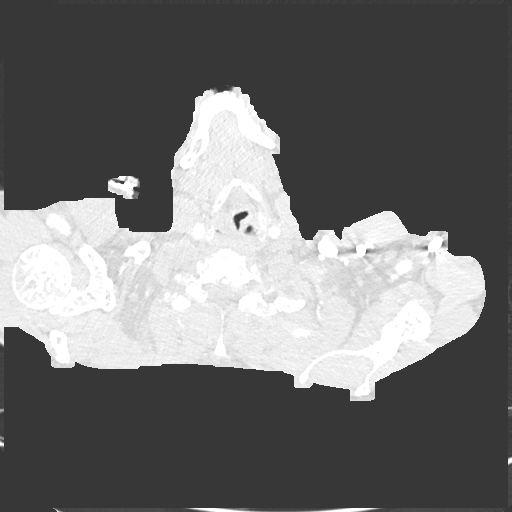

[Series 7: cor soft · coronal · 0.67mm/px · 1 of 127 slices shown]
[im 64/127  mediastinal]
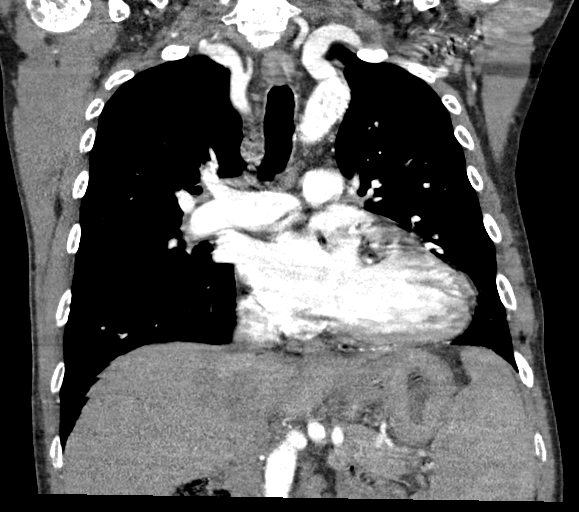

[18 of 36 positions shown; findings below may reference images not displayed]

FINDINGS: Cardiovascular: Stable, approximately 3.6 cm x 4.2 cm dilatation of
the descending aortic arch is noted. There is no evidence of
associated dissection. Satisfactory opacification of the pulmonary
arteries to the segmental level. No evidence of pulmonary embolism.
There is moderate severity cardiomegaly. No pericardial effusion.

Mediastinum/Nodes: There is mild pretracheal and AP window
lymphadenopathy. Thyroid gland, trachea, and esophagus demonstrate
no significant findings.

Lungs/Pleura: Mild linear scarring and/or atelectasis is seen within
the bilateral lower lobes, left greater than right.

There is no evidence of a pleural effusion or pneumothorax.

Upper Abdomen: The spleen is markedly enlarged.

Musculoskeletal: The osseous structures are diffusely mottled in
appearance.

A 4.5 cm x 5.2 cm expansile lytic lesion is seen within the
posterior aspect of the T12 vertebral body. This is increased in
size when compared to the prior study.

Review of the MIP images confirms the above findings.
IMPRESSION: 1. Stable dilatation of the descending aortic arch without evidence
of associated dissection.
2. Moderate severity cardiomegaly.
3. Marked splenomegaly.
4. Expansile lytic lesion within the T12 vertebral body, increased
in size compared to the prior study this area is hypermetabolic on
the prior nuclear medicine PET/CT and may represent sequelae
associated with an underlying neoplastic process.

## 2022-09-23 DIAGNOSIS — Z79624 Long term (current) use of inhibitors of nucleotide synthesis: Secondary | ICD-10-CM | POA: Diagnosis not present

## 2022-09-23 DIAGNOSIS — Z9221 Personal history of antineoplastic chemotherapy: Secondary | ICD-10-CM | POA: Diagnosis not present

## 2022-09-23 DIAGNOSIS — Z7901 Long term (current) use of anticoagulants: Secondary | ICD-10-CM | POA: Diagnosis not present

## 2022-09-23 DIAGNOSIS — C8590 Non-Hodgkin lymphoma, unspecified, unspecified site: Secondary | ICD-10-CM | POA: Diagnosis not present

## 2022-09-23 DIAGNOSIS — C9000 Multiple myeloma not having achieved remission: Secondary | ICD-10-CM | POA: Diagnosis not present

## 2022-09-23 DIAGNOSIS — Z8781 Personal history of (healed) traumatic fracture: Secondary | ICD-10-CM | POA: Diagnosis not present

## 2022-09-23 DIAGNOSIS — C9001 Multiple myeloma in remission: Secondary | ICD-10-CM | POA: Diagnosis not present

## 2022-09-23 DIAGNOSIS — D649 Anemia, unspecified: Secondary | ICD-10-CM | POA: Diagnosis not present

## 2022-09-23 DIAGNOSIS — Z87311 Personal history of (healed) other pathological fracture: Secondary | ICD-10-CM | POA: Diagnosis not present

## 2022-09-23 DIAGNOSIS — Z7961 Long term (current) use of immunomodulator: Secondary | ICD-10-CM | POA: Diagnosis not present

## 2022-09-23 DIAGNOSIS — Z923 Personal history of irradiation: Secondary | ICD-10-CM | POA: Diagnosis not present

## 2022-09-23 DIAGNOSIS — D72818 Other decreased white blood cell count: Secondary | ICD-10-CM | POA: Diagnosis not present

## 2022-09-23 DIAGNOSIS — C8519 Unspecified B-cell lymphoma, extranodal and solid organ sites: Secondary | ICD-10-CM | POA: Diagnosis not present

## 2022-09-23 DIAGNOSIS — S22001D Stable burst fracture of unspecified thoracic vertebra, subsequent encounter for fracture with routine healing: Secondary | ICD-10-CM | POA: Diagnosis not present

## 2022-09-23 DIAGNOSIS — Z86718 Personal history of other venous thrombosis and embolism: Secondary | ICD-10-CM | POA: Diagnosis not present

## 2022-09-23 DIAGNOSIS — Z9484 Stem cells transplant status: Secondary | ICD-10-CM | POA: Diagnosis not present

## 2022-09-23 DIAGNOSIS — D72810 Lymphocytopenia: Secondary | ICD-10-CM | POA: Diagnosis not present

## 2022-09-23 DIAGNOSIS — Z7983 Long term (current) use of bisphosphonates: Secondary | ICD-10-CM | POA: Diagnosis not present

## 2022-10-28 DIAGNOSIS — Q059 Spina bifida, unspecified: Secondary | ICD-10-CM | POA: Diagnosis not present

## 2022-10-28 DIAGNOSIS — C8580 Other specified types of non-Hodgkin lymphoma, unspecified site: Secondary | ICD-10-CM | POA: Diagnosis not present

## 2022-10-28 DIAGNOSIS — Z8249 Family history of ischemic heart disease and other diseases of the circulatory system: Secondary | ICD-10-CM | POA: Diagnosis not present

## 2022-10-28 DIAGNOSIS — Z833 Family history of diabetes mellitus: Secondary | ICD-10-CM | POA: Diagnosis not present

## 2022-10-28 DIAGNOSIS — Z85828 Personal history of other malignant neoplasm of skin: Secondary | ICD-10-CM | POA: Diagnosis not present

## 2022-10-28 DIAGNOSIS — Z79624 Long term (current) use of inhibitors of nucleotide synthesis: Secondary | ICD-10-CM | POA: Diagnosis not present

## 2022-10-28 DIAGNOSIS — C9000 Multiple myeloma not having achieved remission: Secondary | ICD-10-CM | POA: Diagnosis not present

## 2022-10-28 DIAGNOSIS — C8510 Unspecified B-cell lymphoma, unspecified site: Secondary | ICD-10-CM | POA: Diagnosis not present

## 2022-10-28 DIAGNOSIS — Z8261 Family history of arthritis: Secondary | ICD-10-CM | POA: Diagnosis not present

## 2022-10-28 DIAGNOSIS — D72810 Lymphocytopenia: Secondary | ICD-10-CM | POA: Diagnosis not present

## 2022-10-28 DIAGNOSIS — D649 Anemia, unspecified: Secondary | ICD-10-CM | POA: Diagnosis not present

## 2022-10-28 DIAGNOSIS — Z923 Personal history of irradiation: Secondary | ICD-10-CM | POA: Diagnosis not present

## 2022-10-28 DIAGNOSIS — Z86718 Personal history of other venous thrombosis and embolism: Secondary | ICD-10-CM | POA: Diagnosis not present

## 2022-10-28 DIAGNOSIS — Z8349 Family history of other endocrine, nutritional and metabolic diseases: Secondary | ICD-10-CM | POA: Diagnosis not present

## 2022-10-28 DIAGNOSIS — Z7901 Long term (current) use of anticoagulants: Secondary | ICD-10-CM | POA: Diagnosis not present

## 2022-10-28 DIAGNOSIS — Z7983 Long term (current) use of bisphosphonates: Secondary | ICD-10-CM | POA: Diagnosis not present

## 2022-10-28 DIAGNOSIS — C9001 Multiple myeloma in remission: Secondary | ICD-10-CM | POA: Diagnosis not present

## 2022-10-28 DIAGNOSIS — R21 Rash and other nonspecific skin eruption: Secondary | ICD-10-CM | POA: Diagnosis not present

## 2022-11-11 DIAGNOSIS — Z79624 Long term (current) use of inhibitors of nucleotide synthesis: Secondary | ICD-10-CM | POA: Diagnosis not present

## 2022-11-11 DIAGNOSIS — D72810 Lymphocytopenia: Secondary | ICD-10-CM | POA: Diagnosis not present

## 2022-11-11 DIAGNOSIS — Q059 Spina bifida, unspecified: Secondary | ICD-10-CM | POA: Diagnosis not present

## 2022-11-11 DIAGNOSIS — Z85828 Personal history of other malignant neoplasm of skin: Secondary | ICD-10-CM | POA: Diagnosis not present

## 2022-11-11 DIAGNOSIS — Z7901 Long term (current) use of anticoagulants: Secondary | ICD-10-CM | POA: Diagnosis not present

## 2022-11-11 DIAGNOSIS — Z86718 Personal history of other venous thrombosis and embolism: Secondary | ICD-10-CM | POA: Diagnosis not present

## 2022-11-11 DIAGNOSIS — Z923 Personal history of irradiation: Secondary | ICD-10-CM | POA: Diagnosis not present

## 2022-11-11 DIAGNOSIS — C8510 Unspecified B-cell lymphoma, unspecified site: Secondary | ICD-10-CM | POA: Diagnosis not present

## 2022-11-11 DIAGNOSIS — Z9484 Stem cells transplant status: Secondary | ICD-10-CM | POA: Diagnosis not present

## 2022-11-11 DIAGNOSIS — C9001 Multiple myeloma in remission: Secondary | ICD-10-CM | POA: Diagnosis not present

## 2022-11-11 DIAGNOSIS — C8519 Unspecified B-cell lymphoma, extranodal and solid organ sites: Secondary | ICD-10-CM | POA: Diagnosis not present

## 2022-11-11 DIAGNOSIS — Z23 Encounter for immunization: Secondary | ICD-10-CM | POA: Diagnosis not present

## 2022-11-11 DIAGNOSIS — D649 Anemia, unspecified: Secondary | ICD-10-CM | POA: Diagnosis not present

## 2022-11-11 DIAGNOSIS — Z792 Long term (current) use of antibiotics: Secondary | ICD-10-CM | POA: Diagnosis not present

## 2022-12-09 DIAGNOSIS — Z86718 Personal history of other venous thrombosis and embolism: Secondary | ICD-10-CM | POA: Diagnosis not present

## 2022-12-09 DIAGNOSIS — C9001 Multiple myeloma in remission: Secondary | ICD-10-CM | POA: Diagnosis not present

## 2022-12-09 DIAGNOSIS — Z7961 Long term (current) use of immunomodulator: Secondary | ICD-10-CM | POA: Diagnosis not present

## 2022-12-09 DIAGNOSIS — Z8781 Personal history of (healed) traumatic fracture: Secondary | ICD-10-CM | POA: Diagnosis not present

## 2022-12-09 DIAGNOSIS — C9000 Multiple myeloma not having achieved remission: Secondary | ICD-10-CM | POA: Diagnosis not present

## 2022-12-09 DIAGNOSIS — Z79624 Long term (current) use of inhibitors of nucleotide synthesis: Secondary | ICD-10-CM | POA: Diagnosis not present

## 2022-12-09 DIAGNOSIS — D649 Anemia, unspecified: Secondary | ICD-10-CM | POA: Diagnosis not present

## 2022-12-09 DIAGNOSIS — C7951 Secondary malignant neoplasm of bone: Secondary | ICD-10-CM | POA: Diagnosis not present

## 2022-12-09 DIAGNOSIS — C8510 Unspecified B-cell lymphoma, unspecified site: Secondary | ICD-10-CM | POA: Diagnosis not present

## 2022-12-09 DIAGNOSIS — Z7983 Long term (current) use of bisphosphonates: Secondary | ICD-10-CM | POA: Diagnosis not present

## 2022-12-09 DIAGNOSIS — D709 Neutropenia, unspecified: Secondary | ICD-10-CM | POA: Diagnosis not present

## 2022-12-09 DIAGNOSIS — C8519 Unspecified B-cell lymphoma, extranodal and solid organ sites: Secondary | ICD-10-CM | POA: Diagnosis not present

## 2023-01-06 DIAGNOSIS — C8519 Unspecified B-cell lymphoma, extranodal and solid organ sites: Secondary | ICD-10-CM | POA: Diagnosis not present

## 2023-01-06 DIAGNOSIS — D696 Thrombocytopenia, unspecified: Secondary | ICD-10-CM | POA: Diagnosis not present

## 2023-01-06 DIAGNOSIS — Z7983 Long term (current) use of bisphosphonates: Secondary | ICD-10-CM | POA: Diagnosis not present

## 2023-01-06 DIAGNOSIS — Z7901 Long term (current) use of anticoagulants: Secondary | ICD-10-CM | POA: Diagnosis not present

## 2023-01-06 DIAGNOSIS — Z8781 Personal history of (healed) traumatic fracture: Secondary | ICD-10-CM | POA: Diagnosis not present

## 2023-01-06 DIAGNOSIS — Z79899 Other long term (current) drug therapy: Secondary | ICD-10-CM | POA: Diagnosis not present

## 2023-01-06 DIAGNOSIS — D61818 Other pancytopenia: Secondary | ICD-10-CM | POA: Diagnosis not present

## 2023-01-06 DIAGNOSIS — D509 Iron deficiency anemia, unspecified: Secondary | ICD-10-CM | POA: Diagnosis not present

## 2023-01-06 DIAGNOSIS — Z8572 Personal history of non-Hodgkin lymphomas: Secondary | ICD-10-CM | POA: Diagnosis not present

## 2023-01-06 DIAGNOSIS — Z7961 Long term (current) use of immunomodulator: Secondary | ICD-10-CM | POA: Diagnosis not present

## 2023-01-06 DIAGNOSIS — Z86718 Personal history of other venous thrombosis and embolism: Secondary | ICD-10-CM | POA: Diagnosis not present

## 2023-01-06 DIAGNOSIS — C9001 Multiple myeloma in remission: Secondary | ICD-10-CM | POA: Diagnosis not present

## 2023-01-20 DIAGNOSIS — Z23 Encounter for immunization: Secondary | ICD-10-CM | POA: Diagnosis not present

## 2023-01-20 DIAGNOSIS — C9001 Multiple myeloma in remission: Secondary | ICD-10-CM | POA: Diagnosis not present

## 2023-01-20 DIAGNOSIS — Z9484 Stem cells transplant status: Secondary | ICD-10-CM | POA: Diagnosis not present

## 2023-01-20 DIAGNOSIS — E538 Deficiency of other specified B group vitamins: Secondary | ICD-10-CM | POA: Diagnosis not present

## 2023-01-20 DIAGNOSIS — Z86718 Personal history of other venous thrombosis and embolism: Secondary | ICD-10-CM | POA: Diagnosis not present

## 2023-01-20 DIAGNOSIS — Z7983 Long term (current) use of bisphosphonates: Secondary | ICD-10-CM | POA: Diagnosis not present

## 2023-01-20 DIAGNOSIS — Z0000 Encounter for general adult medical examination without abnormal findings: Secondary | ICD-10-CM | POA: Diagnosis not present

## 2023-01-20 DIAGNOSIS — D509 Iron deficiency anemia, unspecified: Secondary | ICD-10-CM | POA: Diagnosis not present

## 2023-01-20 DIAGNOSIS — C9000 Multiple myeloma not having achieved remission: Secondary | ICD-10-CM | POA: Diagnosis not present

## 2023-01-20 DIAGNOSIS — Z87311 Personal history of (healed) other pathological fracture: Secondary | ICD-10-CM | POA: Diagnosis not present

## 2023-01-20 DIAGNOSIS — C8580 Other specified types of non-Hodgkin lymphoma, unspecified site: Secondary | ICD-10-CM | POA: Diagnosis not present

## 2023-01-20 DIAGNOSIS — Z7901 Long term (current) use of anticoagulants: Secondary | ICD-10-CM | POA: Diagnosis not present

## 2023-01-20 DIAGNOSIS — C8519 Unspecified B-cell lymphoma, extranodal and solid organ sites: Secondary | ICD-10-CM | POA: Diagnosis not present

## 2023-02-17 DIAGNOSIS — C9001 Multiple myeloma in remission: Secondary | ICD-10-CM | POA: Diagnosis not present

## 2023-02-17 DIAGNOSIS — C9000 Multiple myeloma not having achieved remission: Secondary | ICD-10-CM | POA: Diagnosis not present

## 2023-02-17 DIAGNOSIS — Z9484 Stem cells transplant status: Secondary | ICD-10-CM | POA: Diagnosis not present

## 2023-02-17 DIAGNOSIS — C8519 Unspecified B-cell lymphoma, extranodal and solid organ sites: Secondary | ICD-10-CM | POA: Diagnosis not present

## 2023-02-17 DIAGNOSIS — C8580 Other specified types of non-Hodgkin lymphoma, unspecified site: Secondary | ICD-10-CM | POA: Diagnosis not present

## 2023-02-17 DIAGNOSIS — E538 Deficiency of other specified B group vitamins: Secondary | ICD-10-CM | POA: Diagnosis not present

## 2023-03-17 DIAGNOSIS — C8580 Other specified types of non-Hodgkin lymphoma, unspecified site: Secondary | ICD-10-CM | POA: Diagnosis not present

## 2023-03-17 DIAGNOSIS — Z9484 Stem cells transplant status: Secondary | ICD-10-CM | POA: Diagnosis not present

## 2023-03-17 DIAGNOSIS — E538 Deficiency of other specified B group vitamins: Secondary | ICD-10-CM | POA: Diagnosis not present

## 2023-03-17 DIAGNOSIS — C9001 Multiple myeloma in remission: Secondary | ICD-10-CM | POA: Diagnosis not present

## 2023-03-17 DIAGNOSIS — C9000 Multiple myeloma not having achieved remission: Secondary | ICD-10-CM | POA: Diagnosis not present

## 2023-03-17 DIAGNOSIS — C8519 Unspecified B-cell lymphoma, extranodal and solid organ sites: Secondary | ICD-10-CM | POA: Diagnosis not present

## 2023-03-30 DIAGNOSIS — C9001 Multiple myeloma in remission: Secondary | ICD-10-CM | POA: Diagnosis not present

## 2023-03-30 DIAGNOSIS — M899 Disorder of bone, unspecified: Secondary | ICD-10-CM | POA: Diagnosis not present

## 2023-03-30 DIAGNOSIS — I4510 Unspecified right bundle-branch block: Secondary | ICD-10-CM | POA: Diagnosis not present

## 2023-03-30 DIAGNOSIS — Z86718 Personal history of other venous thrombosis and embolism: Secondary | ICD-10-CM | POA: Diagnosis not present

## 2023-03-30 DIAGNOSIS — R918 Other nonspecific abnormal finding of lung field: Secondary | ICD-10-CM | POA: Diagnosis not present

## 2023-03-30 DIAGNOSIS — K661 Hemoperitoneum: Secondary | ICD-10-CM | POA: Diagnosis not present

## 2023-03-30 DIAGNOSIS — S22088A Other fracture of T11-T12 vertebra, initial encounter for closed fracture: Secondary | ICD-10-CM | POA: Diagnosis not present

## 2023-03-30 DIAGNOSIS — R079 Chest pain, unspecified: Secondary | ICD-10-CM | POA: Diagnosis not present

## 2023-03-30 DIAGNOSIS — S2231XD Fracture of one rib, right side, subsequent encounter for fracture with routine healing: Secondary | ICD-10-CM | POA: Diagnosis not present

## 2023-03-30 DIAGNOSIS — S0990XA Unspecified injury of head, initial encounter: Secondary | ICD-10-CM | POA: Diagnosis not present

## 2023-03-30 DIAGNOSIS — S2241XA Multiple fractures of ribs, right side, initial encounter for closed fracture: Secondary | ICD-10-CM | POA: Diagnosis not present

## 2023-03-30 DIAGNOSIS — I517 Cardiomegaly: Secondary | ICD-10-CM | POA: Diagnosis not present

## 2023-03-30 DIAGNOSIS — D61810 Antineoplastic chemotherapy induced pancytopenia: Secondary | ICD-10-CM | POA: Diagnosis not present

## 2023-03-30 DIAGNOSIS — R339 Retention of urine, unspecified: Secondary | ICD-10-CM | POA: Diagnosis not present

## 2023-03-30 DIAGNOSIS — R140 Abdominal distension (gaseous): Secondary | ICD-10-CM | POA: Diagnosis not present

## 2023-03-30 DIAGNOSIS — I959 Hypotension, unspecified: Secondary | ICD-10-CM | POA: Diagnosis not present

## 2023-03-30 DIAGNOSIS — N179 Acute kidney failure, unspecified: Secondary | ICD-10-CM | POA: Diagnosis not present

## 2023-03-30 DIAGNOSIS — M542 Cervicalgia: Secondary | ICD-10-CM | POA: Diagnosis not present

## 2023-03-30 DIAGNOSIS — Z4682 Encounter for fitting and adjustment of non-vascular catheter: Secondary | ICD-10-CM | POA: Diagnosis not present

## 2023-03-30 DIAGNOSIS — S0083XA Contusion of other part of head, initial encounter: Secondary | ICD-10-CM | POA: Diagnosis not present

## 2023-03-30 DIAGNOSIS — S36032A Major laceration of spleen, initial encounter: Secondary | ICD-10-CM | POA: Diagnosis not present

## 2023-03-30 DIAGNOSIS — S22089A Unspecified fracture of T11-T12 vertebra, initial encounter for closed fracture: Secondary | ICD-10-CM | POA: Diagnosis not present

## 2023-03-30 DIAGNOSIS — S32028A Other fracture of second lumbar vertebra, initial encounter for closed fracture: Secondary | ICD-10-CM | POA: Diagnosis not present

## 2023-03-30 DIAGNOSIS — S27892A Contusion of other specified intrathoracic organs, initial encounter: Secondary | ICD-10-CM | POA: Diagnosis not present

## 2023-03-30 DIAGNOSIS — S2220XA Unspecified fracture of sternum, initial encounter for closed fracture: Secondary | ICD-10-CM | POA: Diagnosis not present

## 2023-03-30 DIAGNOSIS — S20219A Contusion of unspecified front wall of thorax, initial encounter: Secondary | ICD-10-CM | POA: Diagnosis not present

## 2023-03-30 DIAGNOSIS — T794XXA Traumatic shock, initial encounter: Secondary | ICD-10-CM | POA: Diagnosis not present

## 2023-03-30 DIAGNOSIS — S301XXA Contusion of abdominal wall, initial encounter: Secondary | ICD-10-CM | POA: Diagnosis not present

## 2023-03-30 DIAGNOSIS — M545 Low back pain, unspecified: Secondary | ICD-10-CM | POA: Diagnosis not present

## 2023-03-30 DIAGNOSIS — R0689 Other abnormalities of breathing: Secondary | ICD-10-CM | POA: Diagnosis not present

## 2023-03-30 DIAGNOSIS — S2242XA Multiple fractures of ribs, left side, initial encounter for closed fracture: Secondary | ICD-10-CM | POA: Diagnosis not present

## 2023-03-30 DIAGNOSIS — R0609 Other forms of dyspnea: Secondary | ICD-10-CM | POA: Diagnosis not present

## 2023-03-30 DIAGNOSIS — R112 Nausea with vomiting, unspecified: Secondary | ICD-10-CM | POA: Diagnosis not present

## 2023-03-30 DIAGNOSIS — Z85820 Personal history of malignant melanoma of skin: Secondary | ICD-10-CM | POA: Diagnosis not present

## 2023-03-30 DIAGNOSIS — S60519A Abrasion of unspecified hand, initial encounter: Secondary | ICD-10-CM | POA: Diagnosis not present

## 2023-03-30 DIAGNOSIS — S36039A Unspecified laceration of spleen, initial encounter: Secondary | ICD-10-CM | POA: Diagnosis not present

## 2023-03-30 DIAGNOSIS — S32020A Wedge compression fracture of second lumbar vertebra, initial encounter for closed fracture: Secondary | ICD-10-CM | POA: Diagnosis not present

## 2023-03-30 DIAGNOSIS — J9811 Atelectasis: Secondary | ICD-10-CM | POA: Diagnosis not present

## 2023-03-30 DIAGNOSIS — J9383 Other pneumothorax: Secondary | ICD-10-CM | POA: Diagnosis not present

## 2023-03-30 DIAGNOSIS — S32029A Unspecified fracture of second lumbar vertebra, initial encounter for closed fracture: Secondary | ICD-10-CM | POA: Diagnosis not present

## 2023-03-30 DIAGNOSIS — S2243XA Multiple fractures of ribs, bilateral, initial encounter for closed fracture: Secondary | ICD-10-CM | POA: Diagnosis not present

## 2023-03-30 DIAGNOSIS — S2222XA Fracture of body of sternum, initial encounter for closed fracture: Secondary | ICD-10-CM | POA: Diagnosis not present

## 2023-03-30 DIAGNOSIS — S36031A Moderate laceration of spleen, initial encounter: Secondary | ICD-10-CM | POA: Diagnosis not present

## 2023-03-30 DIAGNOSIS — T1490XA Injury, unspecified, initial encounter: Secondary | ICD-10-CM | POA: Diagnosis not present

## 2023-03-30 DIAGNOSIS — R0989 Other specified symptoms and signs involving the circulatory and respiratory systems: Secondary | ICD-10-CM | POA: Diagnosis not present

## 2023-03-30 DIAGNOSIS — S22079A Unspecified fracture of T9-T10 vertebra, initial encounter for closed fracture: Secondary | ICD-10-CM | POA: Diagnosis not present

## 2023-03-30 DIAGNOSIS — I491 Atrial premature depolarization: Secondary | ICD-10-CM | POA: Diagnosis not present

## 2023-03-30 DIAGNOSIS — Z7901 Long term (current) use of anticoagulants: Secondary | ICD-10-CM | POA: Diagnosis not present

## 2023-03-30 DIAGNOSIS — S22078A Other fracture of T9-T10 vertebra, initial encounter for closed fracture: Secondary | ICD-10-CM | POA: Diagnosis not present

## 2023-03-30 DIAGNOSIS — Z85828 Personal history of other malignant neoplasm of skin: Secondary | ICD-10-CM | POA: Diagnosis not present

## 2023-03-30 DIAGNOSIS — Q059 Spina bifida, unspecified: Secondary | ICD-10-CM | POA: Diagnosis not present

## 2023-03-30 DIAGNOSIS — I440 Atrioventricular block, first degree: Secondary | ICD-10-CM | POA: Diagnosis not present

## 2023-03-30 DIAGNOSIS — Z9484 Stem cells transplant status: Secondary | ICD-10-CM | POA: Diagnosis not present

## 2023-03-30 DIAGNOSIS — T451X5A Adverse effect of antineoplastic and immunosuppressive drugs, initial encounter: Secondary | ICD-10-CM | POA: Diagnosis not present

## 2023-03-30 DIAGNOSIS — Z978 Presence of other specified devices: Secondary | ICD-10-CM | POA: Diagnosis not present

## 2023-03-30 DIAGNOSIS — J984 Other disorders of lung: Secondary | ICD-10-CM | POA: Diagnosis not present

## 2023-03-30 DIAGNOSIS — M79662 Pain in left lower leg: Secondary | ICD-10-CM | POA: Diagnosis not present

## 2023-03-30 DIAGNOSIS — C8590 Non-Hodgkin lymphoma, unspecified, unspecified site: Secondary | ICD-10-CM | POA: Diagnosis not present

## 2023-03-31 DIAGNOSIS — J9383 Other pneumothorax: Secondary | ICD-10-CM | POA: Diagnosis not present

## 2023-03-31 DIAGNOSIS — S36039A Unspecified laceration of spleen, initial encounter: Secondary | ICD-10-CM | POA: Diagnosis not present

## 2023-03-31 DIAGNOSIS — S22079A Unspecified fracture of T9-T10 vertebra, initial encounter for closed fracture: Secondary | ICD-10-CM | POA: Diagnosis not present

## 2023-03-31 DIAGNOSIS — S2243XA Multiple fractures of ribs, bilateral, initial encounter for closed fracture: Secondary | ICD-10-CM | POA: Diagnosis not present

## 2023-04-01 DIAGNOSIS — S2243XA Multiple fractures of ribs, bilateral, initial encounter for closed fracture: Secondary | ICD-10-CM | POA: Diagnosis not present

## 2023-04-01 DIAGNOSIS — S32020A Wedge compression fracture of second lumbar vertebra, initial encounter for closed fracture: Secondary | ICD-10-CM | POA: Diagnosis not present

## 2023-04-01 DIAGNOSIS — K661 Hemoperitoneum: Secondary | ICD-10-CM | POA: Diagnosis not present

## 2023-04-01 DIAGNOSIS — S36039A Unspecified laceration of spleen, initial encounter: Secondary | ICD-10-CM | POA: Diagnosis not present

## 2023-04-01 DIAGNOSIS — S2220XA Unspecified fracture of sternum, initial encounter for closed fracture: Secondary | ICD-10-CM | POA: Diagnosis not present

## 2023-04-02 DIAGNOSIS — I491 Atrial premature depolarization: Secondary | ICD-10-CM | POA: Diagnosis not present

## 2023-04-02 DIAGNOSIS — I4510 Unspecified right bundle-branch block: Secondary | ICD-10-CM | POA: Diagnosis not present

## 2023-04-02 DIAGNOSIS — I440 Atrioventricular block, first degree: Secondary | ICD-10-CM | POA: Diagnosis not present

## 2023-04-02 DIAGNOSIS — I517 Cardiomegaly: Secondary | ICD-10-CM | POA: Diagnosis not present

## 2023-04-03 DIAGNOSIS — S2220XA Unspecified fracture of sternum, initial encounter for closed fracture: Secondary | ICD-10-CM | POA: Diagnosis not present

## 2023-04-03 DIAGNOSIS — S2243XA Multiple fractures of ribs, bilateral, initial encounter for closed fracture: Secondary | ICD-10-CM | POA: Diagnosis not present

## 2023-04-03 DIAGNOSIS — K661 Hemoperitoneum: Secondary | ICD-10-CM | POA: Diagnosis not present

## 2023-04-03 DIAGNOSIS — S36039A Unspecified laceration of spleen, initial encounter: Secondary | ICD-10-CM | POA: Diagnosis not present

## 2023-04-03 DIAGNOSIS — S32020A Wedge compression fracture of second lumbar vertebra, initial encounter for closed fracture: Secondary | ICD-10-CM | POA: Diagnosis not present

## 2023-04-04 DIAGNOSIS — S36039A Unspecified laceration of spleen, initial encounter: Secondary | ICD-10-CM | POA: Diagnosis not present

## 2023-04-05 DIAGNOSIS — S36039A Unspecified laceration of spleen, initial encounter: Secondary | ICD-10-CM | POA: Diagnosis not present

## 2023-04-06 DIAGNOSIS — T1490XA Injury, unspecified, initial encounter: Secondary | ICD-10-CM | POA: Diagnosis not present

## 2023-04-07 DIAGNOSIS — S2243XA Multiple fractures of ribs, bilateral, initial encounter for closed fracture: Secondary | ICD-10-CM | POA: Diagnosis not present

## 2023-04-07 DIAGNOSIS — S22079A Unspecified fracture of T9-T10 vertebra, initial encounter for closed fracture: Secondary | ICD-10-CM | POA: Diagnosis not present

## 2023-04-07 DIAGNOSIS — S36039A Unspecified laceration of spleen, initial encounter: Secondary | ICD-10-CM | POA: Diagnosis not present

## 2023-04-07 DIAGNOSIS — K661 Hemoperitoneum: Secondary | ICD-10-CM | POA: Diagnosis not present

## 2023-04-07 DIAGNOSIS — S2220XA Unspecified fracture of sternum, initial encounter for closed fracture: Secondary | ICD-10-CM | POA: Diagnosis not present

## 2023-04-07 DIAGNOSIS — S32029A Unspecified fracture of second lumbar vertebra, initial encounter for closed fracture: Secondary | ICD-10-CM | POA: Diagnosis not present

## 2023-04-08 DIAGNOSIS — S36039A Unspecified laceration of spleen, initial encounter: Secondary | ICD-10-CM | POA: Diagnosis not present

## 2023-04-21 DIAGNOSIS — D508 Other iron deficiency anemias: Secondary | ICD-10-CM | POA: Diagnosis not present

## 2023-04-21 DIAGNOSIS — C8580 Other specified types of non-Hodgkin lymphoma, unspecified site: Secondary | ICD-10-CM | POA: Diagnosis not present

## 2023-04-21 DIAGNOSIS — C8519 Unspecified B-cell lymphoma, extranodal and solid organ sites: Secondary | ICD-10-CM | POA: Diagnosis not present

## 2023-04-21 DIAGNOSIS — Z9484 Stem cells transplant status: Secondary | ICD-10-CM | POA: Diagnosis not present

## 2023-04-21 DIAGNOSIS — S22001S Stable burst fracture of unspecified thoracic vertebra, sequela: Secondary | ICD-10-CM | POA: Diagnosis not present

## 2023-04-21 DIAGNOSIS — E538 Deficiency of other specified B group vitamins: Secondary | ICD-10-CM | POA: Diagnosis not present

## 2023-04-21 DIAGNOSIS — C9000 Multiple myeloma not having achieved remission: Secondary | ICD-10-CM | POA: Diagnosis not present

## 2023-05-07 DIAGNOSIS — S2243XD Multiple fractures of ribs, bilateral, subsequent encounter for fracture with routine healing: Secondary | ICD-10-CM | POA: Diagnosis not present

## 2023-05-07 DIAGNOSIS — T1490XA Injury, unspecified, initial encounter: Secondary | ICD-10-CM | POA: Diagnosis not present

## 2023-05-07 DIAGNOSIS — S2222XD Fracture of body of sternum, subsequent encounter for fracture with routine healing: Secondary | ICD-10-CM | POA: Diagnosis not present

## 2023-05-19 DIAGNOSIS — C8519 Unspecified B-cell lymphoma, extranodal and solid organ sites: Secondary | ICD-10-CM | POA: Diagnosis not present

## 2023-05-19 DIAGNOSIS — C8580 Other specified types of non-Hodgkin lymphoma, unspecified site: Secondary | ICD-10-CM | POA: Diagnosis not present

## 2023-05-19 DIAGNOSIS — D509 Iron deficiency anemia, unspecified: Secondary | ICD-10-CM | POA: Diagnosis not present

## 2023-05-19 DIAGNOSIS — C9000 Multiple myeloma not having achieved remission: Secondary | ICD-10-CM | POA: Diagnosis not present

## 2023-05-19 DIAGNOSIS — E538 Deficiency of other specified B group vitamins: Secondary | ICD-10-CM | POA: Diagnosis not present

## 2023-05-19 DIAGNOSIS — Z9484 Stem cells transplant status: Secondary | ICD-10-CM | POA: Diagnosis not present

## 2023-05-19 DIAGNOSIS — D508 Other iron deficiency anemias: Secondary | ICD-10-CM | POA: Diagnosis not present

## 2023-05-27 DIAGNOSIS — S32009D Unspecified fracture of unspecified lumbar vertebra, subsequent encounter for fracture with routine healing: Secondary | ICD-10-CM | POA: Diagnosis not present

## 2023-05-27 DIAGNOSIS — Z4789 Encounter for other orthopedic aftercare: Secondary | ICD-10-CM | POA: Diagnosis not present

## 2023-06-16 DIAGNOSIS — C9000 Multiple myeloma not having achieved remission: Secondary | ICD-10-CM | POA: Diagnosis not present

## 2023-06-16 DIAGNOSIS — Z9484 Stem cells transplant status: Secondary | ICD-10-CM | POA: Diagnosis not present

## 2023-06-16 DIAGNOSIS — C8519 Unspecified B-cell lymphoma, extranodal and solid organ sites: Secondary | ICD-10-CM | POA: Diagnosis not present

## 2023-06-16 DIAGNOSIS — C8580 Other specified types of non-Hodgkin lymphoma, unspecified site: Secondary | ICD-10-CM | POA: Diagnosis not present

## 2023-07-14 DIAGNOSIS — C9000 Multiple myeloma not having achieved remission: Secondary | ICD-10-CM | POA: Diagnosis not present

## 2023-07-14 DIAGNOSIS — E538 Deficiency of other specified B group vitamins: Secondary | ICD-10-CM | POA: Diagnosis not present

## 2023-07-14 DIAGNOSIS — C8580 Other specified types of non-Hodgkin lymphoma, unspecified site: Secondary | ICD-10-CM | POA: Diagnosis not present

## 2023-07-14 DIAGNOSIS — I82622 Acute embolism and thrombosis of deep veins of left upper extremity: Secondary | ICD-10-CM | POA: Diagnosis not present

## 2023-07-14 DIAGNOSIS — C9001 Multiple myeloma in remission: Secondary | ICD-10-CM | POA: Diagnosis not present

## 2023-07-14 DIAGNOSIS — M4850XA Collapsed vertebra, not elsewhere classified, site unspecified, initial encounter for fracture: Secondary | ICD-10-CM | POA: Diagnosis not present

## 2023-07-14 DIAGNOSIS — Z9484 Stem cells transplant status: Secondary | ICD-10-CM | POA: Diagnosis not present

## 2023-07-14 DIAGNOSIS — C8519 Unspecified B-cell lymphoma, extranodal and solid organ sites: Secondary | ICD-10-CM | POA: Diagnosis not present

## 2023-08-11 DIAGNOSIS — C8519 Unspecified B-cell lymphoma, extranodal and solid organ sites: Secondary | ICD-10-CM | POA: Diagnosis not present

## 2023-08-11 DIAGNOSIS — C9000 Multiple myeloma not having achieved remission: Secondary | ICD-10-CM | POA: Diagnosis not present

## 2023-08-11 DIAGNOSIS — D509 Iron deficiency anemia, unspecified: Secondary | ICD-10-CM | POA: Diagnosis not present

## 2023-08-11 DIAGNOSIS — C9001 Multiple myeloma in remission: Secondary | ICD-10-CM | POA: Diagnosis not present

## 2023-09-08 DIAGNOSIS — Z9484 Stem cells transplant status: Secondary | ICD-10-CM | POA: Diagnosis not present

## 2023-09-08 DIAGNOSIS — C8519 Unspecified B-cell lymphoma, extranodal and solid organ sites: Secondary | ICD-10-CM | POA: Diagnosis not present

## 2023-09-08 DIAGNOSIS — I82622 Acute embolism and thrombosis of deep veins of left upper extremity: Secondary | ICD-10-CM | POA: Diagnosis not present

## 2023-09-08 DIAGNOSIS — Z7901 Long term (current) use of anticoagulants: Secondary | ICD-10-CM | POA: Diagnosis not present

## 2023-09-08 DIAGNOSIS — D61818 Other pancytopenia: Secondary | ICD-10-CM | POA: Diagnosis not present

## 2023-09-08 DIAGNOSIS — S22001S Stable burst fracture of unspecified thoracic vertebra, sequela: Secondary | ICD-10-CM | POA: Diagnosis not present

## 2023-09-08 DIAGNOSIS — C851A Unspecified b-cell lymphoma, in remission: Secondary | ICD-10-CM | POA: Diagnosis not present

## 2023-09-08 DIAGNOSIS — C9001 Multiple myeloma in remission: Secondary | ICD-10-CM | POA: Diagnosis not present

## 2023-09-08 DIAGNOSIS — C9000 Multiple myeloma not having achieved remission: Secondary | ICD-10-CM | POA: Diagnosis not present

## 2023-10-06 DIAGNOSIS — C9001 Multiple myeloma in remission: Secondary | ICD-10-CM | POA: Diagnosis not present

## 2023-10-06 DIAGNOSIS — C8519 Unspecified B-cell lymphoma, extranodal and solid organ sites: Secondary | ICD-10-CM | POA: Diagnosis not present

## 2023-10-06 DIAGNOSIS — Z9484 Stem cells transplant status: Secondary | ICD-10-CM | POA: Diagnosis not present

## 2023-10-06 DIAGNOSIS — C9000 Multiple myeloma not having achieved remission: Secondary | ICD-10-CM | POA: Diagnosis not present

## 2023-10-06 DIAGNOSIS — C8580 Other specified types of non-Hodgkin lymphoma, unspecified site: Secondary | ICD-10-CM | POA: Diagnosis not present

## 2023-11-03 DIAGNOSIS — Z9484 Stem cells transplant status: Secondary | ICD-10-CM | POA: Diagnosis not present

## 2023-11-03 DIAGNOSIS — C9000 Multiple myeloma not having achieved remission: Secondary | ICD-10-CM | POA: Diagnosis not present

## 2023-11-03 DIAGNOSIS — C8519 Unspecified B-cell lymphoma, extranodal and solid organ sites: Secondary | ICD-10-CM | POA: Diagnosis not present

## 2023-11-03 DIAGNOSIS — C9001 Multiple myeloma in remission: Secondary | ICD-10-CM | POA: Diagnosis not present

## 2023-12-01 DIAGNOSIS — C9001 Multiple myeloma in remission: Secondary | ICD-10-CM | POA: Diagnosis not present

## 2023-12-01 DIAGNOSIS — C8519 Unspecified B-cell lymphoma, extranodal and solid organ sites: Secondary | ICD-10-CM | POA: Diagnosis not present

## 2023-12-01 DIAGNOSIS — Z9484 Stem cells transplant status: Secondary | ICD-10-CM | POA: Diagnosis not present

## 2023-12-01 DIAGNOSIS — C9000 Multiple myeloma not having achieved remission: Secondary | ICD-10-CM | POA: Diagnosis not present

## 2023-12-01 DIAGNOSIS — L989 Disorder of the skin and subcutaneous tissue, unspecified: Secondary | ICD-10-CM | POA: Diagnosis not present

## 2023-12-29 DIAGNOSIS — C9001 Multiple myeloma in remission: Secondary | ICD-10-CM | POA: Diagnosis not present

## 2023-12-29 DIAGNOSIS — D72810 Lymphocytopenia: Secondary | ICD-10-CM | POA: Diagnosis not present

## 2024-01-01 DIAGNOSIS — C9001 Multiple myeloma in remission: Secondary | ICD-10-CM | POA: Diagnosis not present

## 2024-01-01 DIAGNOSIS — Z7983 Long term (current) use of bisphosphonates: Secondary | ICD-10-CM | POA: Diagnosis not present

## 2024-01-26 DIAGNOSIS — D72810 Lymphocytopenia: Secondary | ICD-10-CM | POA: Diagnosis not present

## 2024-01-26 DIAGNOSIS — C9000 Multiple myeloma not having achieved remission: Secondary | ICD-10-CM | POA: Diagnosis not present

## 2024-01-26 DIAGNOSIS — C9001 Multiple myeloma in remission: Secondary | ICD-10-CM | POA: Diagnosis not present

## 2024-01-26 DIAGNOSIS — C8519 Unspecified B-cell lymphoma, extranodal and solid organ sites: Secondary | ICD-10-CM | POA: Diagnosis not present

## 2024-03-01 DIAGNOSIS — S22001S Stable burst fracture of unspecified thoracic vertebra, sequela: Secondary | ICD-10-CM | POA: Diagnosis not present

## 2024-03-01 DIAGNOSIS — C8519 Unspecified B-cell lymphoma, extranodal and solid organ sites: Secondary | ICD-10-CM | POA: Diagnosis not present

## 2024-03-01 DIAGNOSIS — C9001 Multiple myeloma in remission: Secondary | ICD-10-CM | POA: Diagnosis not present

## 2024-03-01 DIAGNOSIS — I82622 Acute embolism and thrombosis of deep veins of left upper extremity: Secondary | ICD-10-CM | POA: Diagnosis not present

## 2024-03-01 DIAGNOSIS — D508 Other iron deficiency anemias: Secondary | ICD-10-CM | POA: Diagnosis not present

## 2024-03-01 DIAGNOSIS — Z9484 Stem cells transplant status: Secondary | ICD-10-CM | POA: Diagnosis not present

## 2024-03-21 DIAGNOSIS — Z1211 Encounter for screening for malignant neoplasm of colon: Secondary | ICD-10-CM | POA: Diagnosis not present

## 2024-03-21 DIAGNOSIS — C9000 Multiple myeloma not having achieved remission: Secondary | ICD-10-CM | POA: Diagnosis not present

## 2024-03-21 DIAGNOSIS — K5730 Diverticulosis of large intestine without perforation or abscess without bleeding: Secondary | ICD-10-CM | POA: Diagnosis not present

## 2024-03-21 DIAGNOSIS — K648 Other hemorrhoids: Secondary | ICD-10-CM | POA: Diagnosis not present

## 2024-03-29 DIAGNOSIS — C9001 Multiple myeloma in remission: Secondary | ICD-10-CM | POA: Diagnosis not present

## 2024-03-29 DIAGNOSIS — C8580 Other specified types of non-Hodgkin lymphoma, unspecified site: Secondary | ICD-10-CM | POA: Diagnosis not present

## 2024-03-29 DIAGNOSIS — E538 Deficiency of other specified B group vitamins: Secondary | ICD-10-CM | POA: Diagnosis not present

## 2024-03-29 DIAGNOSIS — S22001S Stable burst fracture of unspecified thoracic vertebra, sequela: Secondary | ICD-10-CM | POA: Diagnosis not present

## 2024-03-29 DIAGNOSIS — D508 Other iron deficiency anemias: Secondary | ICD-10-CM | POA: Diagnosis not present

## 2024-03-29 DIAGNOSIS — I82622 Acute embolism and thrombosis of deep veins of left upper extremity: Secondary | ICD-10-CM | POA: Diagnosis not present

## 2024-03-29 DIAGNOSIS — C9000 Multiple myeloma not having achieved remission: Secondary | ICD-10-CM | POA: Diagnosis not present

## 2024-03-29 DIAGNOSIS — Z9484 Stem cells transplant status: Secondary | ICD-10-CM | POA: Diagnosis not present

## 2024-04-25 DIAGNOSIS — C9001 Multiple myeloma in remission: Secondary | ICD-10-CM | POA: Diagnosis not present

## 2024-04-25 DIAGNOSIS — C9000 Multiple myeloma not having achieved remission: Secondary | ICD-10-CM | POA: Diagnosis not present

## 2024-04-27 DIAGNOSIS — L989 Disorder of the skin and subcutaneous tissue, unspecified: Secondary | ICD-10-CM | POA: Diagnosis not present

## 2024-04-27 DIAGNOSIS — D485 Neoplasm of uncertain behavior of skin: Secondary | ICD-10-CM | POA: Diagnosis not present

## 2024-04-27 DIAGNOSIS — C4441 Basal cell carcinoma of skin of scalp and neck: Secondary | ICD-10-CM | POA: Diagnosis not present

## 2024-05-19 DIAGNOSIS — C4441 Basal cell carcinoma of skin of scalp and neck: Secondary | ICD-10-CM | POA: Diagnosis not present

## 2024-05-25 DIAGNOSIS — Z9484 Stem cells transplant status: Secondary | ICD-10-CM | POA: Diagnosis not present

## 2024-05-25 DIAGNOSIS — C8519 Unspecified B-cell lymphoma, extranodal and solid organ sites: Secondary | ICD-10-CM | POA: Diagnosis not present

## 2024-05-25 DIAGNOSIS — I82622 Acute embolism and thrombosis of deep veins of left upper extremity: Secondary | ICD-10-CM | POA: Diagnosis not present

## 2024-05-25 DIAGNOSIS — D696 Thrombocytopenia, unspecified: Secondary | ICD-10-CM | POA: Diagnosis not present

## 2024-05-25 DIAGNOSIS — C9001 Multiple myeloma in remission: Secondary | ICD-10-CM | POA: Diagnosis not present

## 2024-05-25 DIAGNOSIS — C9000 Multiple myeloma not having achieved remission: Secondary | ICD-10-CM | POA: Diagnosis not present

## 2024-05-25 DIAGNOSIS — D649 Anemia, unspecified: Secondary | ICD-10-CM | POA: Diagnosis not present

## 2024-06-01 DIAGNOSIS — Z4802 Encounter for removal of sutures: Secondary | ICD-10-CM | POA: Diagnosis not present

## 2024-06-22 DIAGNOSIS — Z86718 Personal history of other venous thrombosis and embolism: Secondary | ICD-10-CM | POA: Diagnosis not present

## 2024-06-22 DIAGNOSIS — C8519 Unspecified B-cell lymphoma, extranodal and solid organ sites: Secondary | ICD-10-CM | POA: Diagnosis not present

## 2024-06-22 DIAGNOSIS — Z9484 Stem cells transplant status: Secondary | ICD-10-CM | POA: Diagnosis not present

## 2024-06-22 DIAGNOSIS — D696 Thrombocytopenia, unspecified: Secondary | ICD-10-CM | POA: Diagnosis not present

## 2024-06-22 DIAGNOSIS — C9001 Multiple myeloma in remission: Secondary | ICD-10-CM | POA: Diagnosis not present

## 2024-06-22 DIAGNOSIS — C9000 Multiple myeloma not having achieved remission: Secondary | ICD-10-CM | POA: Diagnosis not present

## 2024-06-22 DIAGNOSIS — D649 Anemia, unspecified: Secondary | ICD-10-CM | POA: Diagnosis not present

## 2024-07-20 DIAGNOSIS — Z9484 Stem cells transplant status: Secondary | ICD-10-CM | POA: Diagnosis not present

## 2024-07-20 DIAGNOSIS — C9001 Multiple myeloma in remission: Secondary | ICD-10-CM | POA: Diagnosis not present

## 2024-07-20 DIAGNOSIS — D508 Other iron deficiency anemias: Secondary | ICD-10-CM | POA: Diagnosis not present

## 2024-07-20 DIAGNOSIS — E538 Deficiency of other specified B group vitamins: Secondary | ICD-10-CM | POA: Diagnosis not present

## 2024-07-20 DIAGNOSIS — I82622 Acute embolism and thrombosis of deep veins of left upper extremity: Secondary | ICD-10-CM | POA: Diagnosis not present
